# Patient Record
Sex: Male | Born: 1950 | Race: Black or African American | Hispanic: No | Smoking: Current every day smoker
Health system: Southern US, Community
[De-identification: ages and names within clinical notes are randomized; demographics above are authoritative.]

## PROBLEM LIST (undated history)

## (undated) DIAGNOSIS — I499 Cardiac arrhythmia, unspecified: Secondary | ICD-10-CM

## (undated) DIAGNOSIS — I10 Essential (primary) hypertension: Secondary | ICD-10-CM

## (undated) DIAGNOSIS — G47419 Narcolepsy without cataplexy: Secondary | ICD-10-CM

## (undated) DIAGNOSIS — H409 Unspecified glaucoma: Secondary | ICD-10-CM

## (undated) DIAGNOSIS — I519 Heart disease, unspecified: Secondary | ICD-10-CM

## (undated) DIAGNOSIS — G47411 Narcolepsy with cataplexy: Secondary | ICD-10-CM

## (undated) DIAGNOSIS — F1010 Alcohol abuse, uncomplicated: Secondary | ICD-10-CM

## (undated) HISTORY — DX: Narcolepsy with cataplexy: G47.411

## (undated) HISTORY — DX: Heart disease, unspecified: I51.9

## (undated) HISTORY — DX: Cardiac arrhythmia, unspecified: I49.9

## (undated) HISTORY — PX: HERNIA REPAIR: SHX51

---

## 2017-11-01 DIAGNOSIS — Z125 Encounter for screening for malignant neoplasm of prostate: Secondary | ICD-10-CM | POA: Diagnosis not present

## 2017-11-01 DIAGNOSIS — Z6822 Body mass index (BMI) 22.0-22.9, adult: Secondary | ICD-10-CM | POA: Diagnosis not present

## 2017-11-01 DIAGNOSIS — K450 Other specified abdominal hernia with obstruction, without gangrene: Secondary | ICD-10-CM | POA: Diagnosis not present

## 2017-11-01 DIAGNOSIS — I10 Essential (primary) hypertension: Secondary | ICD-10-CM | POA: Diagnosis not present

## 2017-11-25 DIAGNOSIS — K429 Umbilical hernia without obstruction or gangrene: Secondary | ICD-10-CM | POA: Diagnosis not present

## 2017-11-30 DIAGNOSIS — Z79899 Other long term (current) drug therapy: Secondary | ICD-10-CM | POA: Diagnosis not present

## 2017-11-30 DIAGNOSIS — Z23 Encounter for immunization: Secondary | ICD-10-CM | POA: Diagnosis not present

## 2017-11-30 DIAGNOSIS — M1990 Unspecified osteoarthritis, unspecified site: Secondary | ICD-10-CM | POA: Diagnosis not present

## 2017-11-30 DIAGNOSIS — F1010 Alcohol abuse, uncomplicated: Secondary | ICD-10-CM | POA: Diagnosis not present

## 2017-11-30 DIAGNOSIS — F17200 Nicotine dependence, unspecified, uncomplicated: Secondary | ICD-10-CM | POA: Diagnosis not present

## 2017-11-30 DIAGNOSIS — K439 Ventral hernia without obstruction or gangrene: Secondary | ICD-10-CM | POA: Diagnosis not present

## 2017-11-30 DIAGNOSIS — I10 Essential (primary) hypertension: Secondary | ICD-10-CM | POA: Diagnosis not present

## 2017-12-01 DIAGNOSIS — K439 Ventral hernia without obstruction or gangrene: Secondary | ICD-10-CM | POA: Diagnosis not present

## 2017-12-01 DIAGNOSIS — Z79899 Other long term (current) drug therapy: Secondary | ICD-10-CM | POA: Diagnosis not present

## 2017-12-01 DIAGNOSIS — Z23 Encounter for immunization: Secondary | ICD-10-CM | POA: Diagnosis not present

## 2017-12-01 DIAGNOSIS — F1010 Alcohol abuse, uncomplicated: Secondary | ICD-10-CM | POA: Diagnosis not present

## 2017-12-01 DIAGNOSIS — E119 Type 2 diabetes mellitus without complications: Secondary | ICD-10-CM | POA: Diagnosis not present

## 2017-12-01 DIAGNOSIS — F17200 Nicotine dependence, unspecified, uncomplicated: Secondary | ICD-10-CM | POA: Diagnosis not present

## 2017-12-01 DIAGNOSIS — M1990 Unspecified osteoarthritis, unspecified site: Secondary | ICD-10-CM | POA: Diagnosis not present

## 2017-12-01 DIAGNOSIS — I10 Essential (primary) hypertension: Secondary | ICD-10-CM | POA: Diagnosis not present

## 2017-12-01 DIAGNOSIS — K429 Umbilical hernia without obstruction or gangrene: Secondary | ICD-10-CM | POA: Diagnosis not present

## 2017-12-02 DIAGNOSIS — F1010 Alcohol abuse, uncomplicated: Secondary | ICD-10-CM | POA: Diagnosis not present

## 2017-12-02 DIAGNOSIS — F17200 Nicotine dependence, unspecified, uncomplicated: Secondary | ICD-10-CM | POA: Diagnosis not present

## 2017-12-02 DIAGNOSIS — Z23 Encounter for immunization: Secondary | ICD-10-CM | POA: Diagnosis not present

## 2017-12-02 DIAGNOSIS — Z79899 Other long term (current) drug therapy: Secondary | ICD-10-CM | POA: Diagnosis not present

## 2017-12-02 DIAGNOSIS — K439 Ventral hernia without obstruction or gangrene: Secondary | ICD-10-CM | POA: Diagnosis not present

## 2017-12-02 DIAGNOSIS — M1990 Unspecified osteoarthritis, unspecified site: Secondary | ICD-10-CM | POA: Diagnosis not present

## 2017-12-02 DIAGNOSIS — I10 Essential (primary) hypertension: Secondary | ICD-10-CM | POA: Diagnosis not present

## 2017-12-31 DIAGNOSIS — F1210 Cannabis abuse, uncomplicated: Secondary | ICD-10-CM | POA: Diagnosis not present

## 2017-12-31 DIAGNOSIS — M545 Low back pain: Secondary | ICD-10-CM | POA: Diagnosis not present

## 2017-12-31 DIAGNOSIS — R531 Weakness: Secondary | ICD-10-CM | POA: Diagnosis not present

## 2017-12-31 DIAGNOSIS — Z79899 Other long term (current) drug therapy: Secondary | ICD-10-CM | POA: Diagnosis not present

## 2017-12-31 DIAGNOSIS — R4182 Altered mental status, unspecified: Secondary | ICD-10-CM | POA: Diagnosis not present

## 2017-12-31 DIAGNOSIS — I10 Essential (primary) hypertension: Secondary | ICD-10-CM | POA: Diagnosis not present

## 2017-12-31 DIAGNOSIS — M25512 Pain in left shoulder: Secondary | ICD-10-CM | POA: Diagnosis not present

## 2017-12-31 DIAGNOSIS — K852 Alcohol induced acute pancreatitis without necrosis or infection: Secondary | ICD-10-CM | POA: Diagnosis not present

## 2017-12-31 DIAGNOSIS — M19012 Primary osteoarthritis, left shoulder: Secondary | ICD-10-CM | POA: Diagnosis not present

## 2017-12-31 DIAGNOSIS — R42 Dizziness and giddiness: Secondary | ICD-10-CM | POA: Diagnosis not present

## 2017-12-31 DIAGNOSIS — F10231 Alcohol dependence with withdrawal delirium: Secondary | ICD-10-CM | POA: Diagnosis not present

## 2017-12-31 DIAGNOSIS — F1410 Cocaine abuse, uncomplicated: Secondary | ICD-10-CM | POA: Diagnosis not present

## 2017-12-31 DIAGNOSIS — M4696 Unspecified inflammatory spondylopathy, lumbar region: Secondary | ICD-10-CM | POA: Diagnosis not present

## 2018-01-25 DIAGNOSIS — Z6820 Body mass index (BMI) 20.0-20.9, adult: Secondary | ICD-10-CM | POA: Diagnosis not present

## 2018-01-25 DIAGNOSIS — Z0000 Encounter for general adult medical examination without abnormal findings: Secondary | ICD-10-CM | POA: Diagnosis not present

## 2018-01-25 DIAGNOSIS — K8521 Alcohol induced acute pancreatitis with uninfected necrosis: Secondary | ICD-10-CM | POA: Diagnosis not present

## 2018-01-25 DIAGNOSIS — I10 Essential (primary) hypertension: Secondary | ICD-10-CM | POA: Diagnosis not present

## 2018-02-10 DIAGNOSIS — H401133 Primary open-angle glaucoma, bilateral, severe stage: Secondary | ICD-10-CM | POA: Diagnosis not present

## 2018-02-17 DIAGNOSIS — Z13820 Encounter for screening for osteoporosis: Secondary | ICD-10-CM | POA: Diagnosis not present

## 2018-02-17 DIAGNOSIS — M810 Age-related osteoporosis without current pathological fracture: Secondary | ICD-10-CM | POA: Diagnosis not present

## 2018-03-15 ENCOUNTER — Other Ambulatory Visit: Payer: Self-pay

## 2018-03-15 ENCOUNTER — Emergency Department (HOSPITAL_COMMUNITY)
Admission: EM | Admit: 2018-03-15 | Discharge: 2018-03-15 | Disposition: A | Discharge status: Home / Self Care | Payer: Medicare HMO | Attending: Emergency Medicine | Admitting: Emergency Medicine

## 2018-03-15 ENCOUNTER — Encounter (HOSPITAL_COMMUNITY): Payer: Self-pay | Admitting: Emergency Medicine

## 2018-03-15 DIAGNOSIS — I10 Essential (primary) hypertension: Secondary | ICD-10-CM | POA: Diagnosis not present

## 2018-03-15 DIAGNOSIS — F17210 Nicotine dependence, cigarettes, uncomplicated: Secondary | ICD-10-CM | POA: Diagnosis not present

## 2018-03-15 DIAGNOSIS — F1010 Alcohol abuse, uncomplicated: Secondary | ICD-10-CM | POA: Diagnosis not present

## 2018-03-15 DIAGNOSIS — Z008 Encounter for other general examination: Secondary | ICD-10-CM | POA: Diagnosis not present

## 2018-03-15 DIAGNOSIS — H401133 Primary open-angle glaucoma, bilateral, severe stage: Secondary | ICD-10-CM | POA: Diagnosis not present

## 2018-03-15 HISTORY — DX: Alcohol abuse, uncomplicated: F10.10

## 2018-03-15 HISTORY — DX: Unspecified glaucoma: H40.9

## 2018-03-15 HISTORY — DX: Essential (primary) hypertension: I10

## 2018-03-15 HISTORY — DX: Narcolepsy without cataplexy: G47.419

## 2018-03-15 LAB — CBC
HCT: 41.8 % (ref 39.0–52.0)
HEMOGLOBIN: 14.1 g/dL (ref 13.0–17.0)
MCH: 32.4 pg (ref 26.0–34.0)
MCHC: 33.7 g/dL (ref 30.0–36.0)
MCV: 96.1 fL (ref 78.0–100.0)
Platelets: 216 10*3/uL (ref 150–400)
RBC: 4.35 MIL/uL (ref 4.22–5.81)
RDW: 14.2 % (ref 11.5–15.5)
WBC: 5.2 10*3/uL (ref 4.0–10.5)

## 2018-03-15 LAB — RAPID URINE DRUG SCREEN, HOSP PERFORMED
AMPHETAMINES: NOT DETECTED
Benzodiazepines: NOT DETECTED
Cocaine: NOT DETECTED
OPIATES: NOT DETECTED
TETRAHYDROCANNABINOL: POSITIVE — AB

## 2018-03-15 LAB — COMPREHENSIVE METABOLIC PANEL
ALK PHOS: 79 U/L (ref 38–126)
ALT: 27 U/L (ref 0–44)
AST: 29 U/L (ref 15–41)
Albumin: 3.9 g/dL (ref 3.5–5.0)
Anion gap: 8 (ref 5–15)
BILIRUBIN TOTAL: 0.8 mg/dL (ref 0.3–1.2)
BUN: 10 mg/dL (ref 8–23)
CHLORIDE: 103 mmol/L (ref 98–111)
CO2: 29 mmol/L (ref 22–32)
CREATININE: 1.06 mg/dL (ref 0.61–1.24)
Calcium: 9.4 mg/dL (ref 8.9–10.3)
Glucose, Bld: 106 mg/dL — ABNORMAL HIGH (ref 70–99)
Potassium: 4 mmol/L (ref 3.5–5.1)
Sodium: 140 mmol/L (ref 135–145)
TOTAL PROTEIN: 6.6 g/dL (ref 6.5–8.1)

## 2018-03-15 LAB — ACETAMINOPHEN LEVEL: Acetaminophen (Tylenol), Serum: <10 ug/mL — ABNORMAL LOW (ref 10–30)

## 2018-03-15 LAB — SALICYLATE LEVEL

## 2018-03-15 LAB — ETHANOL

## 2018-03-15 MED ORDER — CHLORDIAZEPOXIDE HCL 25 MG PO CAPS: 50.0000 mg | ORAL_CAPSULE | Freq: Three times a day (TID) | ORAL | 0 refills | Status: DC | PRN

## 2018-03-15 MED ORDER — LORAZEPAM 1 MG PO TABS
2.0000 mg | ORAL_TABLET | Freq: Once | ORAL | Status: AC
  Administered 2018-03-15: 2 mg via ORAL
  Filled 2018-03-15: qty 2

## 2018-03-15 NOTE — ED Provider Notes (Signed)
Patient placed in Quick Look pathway, seen and evaluated   Chief Complaint: medical clearance  HPI:   Pt abuse alcohol, trying to quit.  [place] recommend medical screening  ROS: no SI/HI (one)  Physical Exam:   Gen: No distress  Neuro: Awake and Alert  Skin: Warm    Focused Exam: poor historian, no abd tenderness, no tremors   Initiation of care has begun. The patient has been counseled on the process, plan, and necessity for staying for the completion/evaluation, and the remainder of the medical screening examination    Marc Jimenez, Marc Jimenez 03/15/18 1636    Little, Wenda Overland, MD 03/15/18 2109

## 2018-03-15 NOTE — ED Provider Notes (Signed)
[facility] EMERGENCY DEPARTMENT Provider Note   CSN: [id number] Arrival date & time: 03/15/18  1626     History   Chief Complaint Chief Complaint  Patient presents with  . Medical Clearance    HPI Marc Jimenez is a 67 y.o. male.  67 year old male with past medical history including alcohol abuse, hypertension, glaucoma, narcolepsy who presents with alcohol abuse.  Family brought him in today for medical screening and admission for alcohol detox.  He drinks heavily daily, last drink was yesterday evening.  No hallucinations, vomiting, SI, or HI.  They spoke with somebody at an outpatient facility who recommended that they come here to be admitted for detox.  Patient currently denies any complaints.  The history is provided by the patient and a relative.    Past Medical History:  Diagnosis Date  . Alcohol abuse   . Glaucoma   . Hypertension   . Narcolepsy     There are no active problems to display for this patient.   Past Surgical History:  Procedure Laterality Date  . HERNIA REPAIR          Home Medications    Prior to Admission medications   Medication Sig Start Date End Date Taking? Authorizing Provider  chlordiazePOXIDE (LIBRIUM) 25 MG capsule Take 2 capsules (50 mg total) by mouth 3 (three) times daily as needed for withdrawal. 50mg  by mouth q 6 h day 1 50mg  by mouth q 8 h day 2 50mg  by mouth q 12 h day 3 50mg  by mouth QHS day 4 03/15/18   Raffaela Ladley, Wenda Overland, MD    Family History History reviewed. No pertinent family history.  Social History Social History   Tobacco Use  . Smoking status: Current Every Day Smoker    Packs/day: 1.00  . Smokeless tobacco: Never Used  Substance Use Topics  . Alcohol use: Yes    Alcohol/week: 12.6 oz    Types: 21 Cans of beer per week    Comment: 1 pint of liquor every day  . Drug use: Yes    Types: Marijuana     Allergies   Patient has no known allergies.   Review of  Systems Review of Systems All other systems reviewed and are negative except that which was mentioned in HPI   Physical Exam Updated Vital Signs BP (!) 178/95   Pulse (!) 54   Temp 97.7 F (36.5 C) (Oral)   Resp 16   Ht 5\' 6"  (1.676 m)   Wt 64.4 kg (142 lb)   SpO2 97%   BMI 22.92 kg/m   Physical Exam  Constitutional: He is oriented to person, place, and time. He appears well-developed. No distress.  Thin, chronically ill-appearing, comfortable  HENT:  Head: Normocephalic and atraumatic.  Moist mucous membranes  Eyes: Pupils are equal, round, and reactive to light.  B/l conjunctival injection  Neck: Neck supple.  Cardiovascular: Regular rhythm and normal heart sounds. Bradycardia present.  No murmur heard. Pulmonary/Chest: Effort normal and breath sounds normal.  Abdominal: Soft. Bowel sounds are normal. He exhibits no distension. There is no tenderness.  Musculoskeletal: He exhibits no edema.  Neurological: He is alert and oriented to person, place, and time.  Fluent speech Following commands, mild bilateral hand tremor  Skin: Skin is warm and dry.  Psychiatric: He has a normal mood and affect.  Nursing note and vitals reviewed.    ED Treatments / Results  Labs (all labs ordered are listed, but only abnormal  results are displayed) Labs Reviewed  COMPREHENSIVE METABOLIC PANEL - Abnormal; Notable for the following components:      Result Value   Glucose, Bld 106 (*)    All other components within normal limits  ACETAMINOPHEN LEVEL - Abnormal; Notable for the following components:   Acetaminophen (Tylenol), Serum <10 (*)    All other components within normal limits  ETHANOL  CBC  SALICYLATE LEVEL  RAPID URINE DRUG SCREEN, HOSP PERFORMED    EKG None  Radiology No results found.  Procedures Procedures (including critical care time)  Medications Ordered in ED Medications  LORazepam (ATIVAN) tablet 2 mg (2 mg Oral Given 03/15/18 2046)     Initial  Impression / Assessment and Plan / ED Course  I have reviewed the triage vital signs and the nursing notes.  Pertinent labs & imaging results that were available during my care of the patient were reviewed by me and considered in my medical decision making (see chart for details).    Patient was comfortable on exam, hypertensive, mild hand tremor but no agitation, no confusion, no evidence of delirium tremens.  His screening lab work is unremarkable.  Gave 1 dose of oral Ativan for tremors.  I spoke with the patient's sister over the phone as well as nephew who was with patient in room.  Splane that unfortunately we are not able to admit the patient for alcohol detox without any severe metabolic or vital sign derangements or any signs/sx of DTs.  Provided with Librium taper and outpatient resources and recommended contacting various outpatient facilities to find a facility where he can be admitted for acute detox.  Extensively reviewed return precautions with the patient and his nephew.  They voiced understanding. Final Clinical Impressions(s) / ED Diagnoses   Final diagnoses:  Alcohol abuse    ED Discharge Orders        Ordered    chlordiazePOXIDE (LIBRIUM) 25 MG capsule  3 times daily PRN     03/15/18 2106       Kourtney Terriquez, Wenda Overland, MD 03/15/18 2114

## 2018-03-15 NOTE — ED Notes (Signed)
Discharge instructions, medications, and community resources reviewed with patient. Pt denied any further requests. Nephew to take home pt. Pt verbalized understanding of discharge instructions.

## 2018-03-15 NOTE — ED Notes (Signed)
ED Provider at bedside. 

## 2018-03-15 NOTE — ED Triage Notes (Signed)
Pt reports he was trying to stop drinking alcohol but can't. Pt last drank something yesterday. Pt's family brought him here for medical clearance to have him admitted to a facility for detox

## 2018-05-05 DIAGNOSIS — Z6820 Body mass index (BMI) 20.0-20.9, adult: Secondary | ICD-10-CM | POA: Diagnosis not present

## 2018-05-05 DIAGNOSIS — Z0000 Encounter for general adult medical examination without abnormal findings: Secondary | ICD-10-CM | POA: Diagnosis not present

## 2018-05-17 DIAGNOSIS — Z800 Family history of malignant neoplasm of digestive organs: Secondary | ICD-10-CM | POA: Diagnosis not present

## 2018-05-25 DIAGNOSIS — Z800 Family history of malignant neoplasm of digestive organs: Secondary | ICD-10-CM | POA: Diagnosis not present

## 2018-05-25 DIAGNOSIS — I10 Essential (primary) hypertension: Secondary | ICD-10-CM | POA: Diagnosis not present

## 2018-05-25 DIAGNOSIS — F17200 Nicotine dependence, unspecified, uncomplicated: Secondary | ICD-10-CM | POA: Diagnosis not present

## 2018-05-25 DIAGNOSIS — Z79899 Other long term (current) drug therapy: Secondary | ICD-10-CM | POA: Diagnosis not present

## 2018-05-25 DIAGNOSIS — Z1211 Encounter for screening for malignant neoplasm of colon: Secondary | ICD-10-CM | POA: Diagnosis not present

## 2018-06-20 DIAGNOSIS — M542 Cervicalgia: Secondary | ICD-10-CM | POA: Diagnosis not present

## 2018-06-20 DIAGNOSIS — I10 Essential (primary) hypertension: Secondary | ICD-10-CM | POA: Diagnosis not present

## 2018-06-20 DIAGNOSIS — Z79899 Other long term (current) drug therapy: Secondary | ICD-10-CM | POA: Diagnosis not present

## 2018-06-20 DIAGNOSIS — M5010 Cervical disc disorder with radiculopathy, unspecified cervical region: Secondary | ICD-10-CM | POA: Diagnosis not present

## 2018-06-20 DIAGNOSIS — F17200 Nicotine dependence, unspecified, uncomplicated: Secondary | ICD-10-CM | POA: Diagnosis not present

## 2018-06-22 DIAGNOSIS — H401113 Primary open-angle glaucoma, right eye, severe stage: Secondary | ICD-10-CM | POA: Diagnosis not present

## 2018-06-22 DIAGNOSIS — Z01818 Encounter for other preprocedural examination: Secondary | ICD-10-CM | POA: Diagnosis not present

## 2018-06-22 DIAGNOSIS — H409 Unspecified glaucoma: Secondary | ICD-10-CM | POA: Diagnosis not present

## 2018-08-12 DIAGNOSIS — S134XXD Sprain of ligaments of cervical spine, subsequent encounter: Secondary | ICD-10-CM | POA: Diagnosis not present

## 2018-08-12 DIAGNOSIS — Z6822 Body mass index (BMI) 22.0-22.9, adult: Secondary | ICD-10-CM | POA: Diagnosis not present

## 2018-08-12 DIAGNOSIS — I10 Essential (primary) hypertension: Secondary | ICD-10-CM | POA: Diagnosis not present

## 2018-09-01 DIAGNOSIS — I10 Essential (primary) hypertension: Secondary | ICD-10-CM | POA: Diagnosis not present

## 2018-09-01 DIAGNOSIS — F17200 Nicotine dependence, unspecified, uncomplicated: Secondary | ICD-10-CM | POA: Diagnosis not present

## 2018-09-01 DIAGNOSIS — R339 Retention of urine, unspecified: Secondary | ICD-10-CM | POA: Diagnosis not present

## 2018-09-05 DIAGNOSIS — Z6821 Body mass index (BMI) 21.0-21.9, adult: Secondary | ICD-10-CM | POA: Diagnosis not present

## 2018-09-05 DIAGNOSIS — N399 Disorder of urinary system, unspecified: Secondary | ICD-10-CM | POA: Diagnosis not present

## 2018-09-05 DIAGNOSIS — F10229 Alcohol dependence with intoxication, unspecified: Secondary | ICD-10-CM | POA: Diagnosis not present

## 2018-09-05 DIAGNOSIS — I10 Essential (primary) hypertension: Secondary | ICD-10-CM | POA: Diagnosis not present

## 2018-09-14 DIAGNOSIS — R338 Other retention of urine: Secondary | ICD-10-CM | POA: Diagnosis not present

## 2018-09-14 DIAGNOSIS — N401 Enlarged prostate with lower urinary tract symptoms: Secondary | ICD-10-CM | POA: Diagnosis not present

## 2018-09-27 DIAGNOSIS — N401 Enlarged prostate with lower urinary tract symptoms: Secondary | ICD-10-CM | POA: Diagnosis not present

## 2018-09-27 DIAGNOSIS — R338 Other retention of urine: Secondary | ICD-10-CM | POA: Diagnosis not present

## 2018-12-19 DIAGNOSIS — Z6820 Body mass index (BMI) 20.0-20.9, adult: Secondary | ICD-10-CM | POA: Diagnosis not present

## 2018-12-19 DIAGNOSIS — R69 Illness, unspecified: Secondary | ICD-10-CM | POA: Diagnosis not present

## 2018-12-19 DIAGNOSIS — I10 Essential (primary) hypertension: Secondary | ICD-10-CM | POA: Diagnosis not present

## 2018-12-19 DIAGNOSIS — M545 Low back pain: Secondary | ICD-10-CM | POA: Diagnosis not present

## 2019-08-09 DIAGNOSIS — I10 Essential (primary) hypertension: Secondary | ICD-10-CM | POA: Diagnosis not present

## 2019-08-09 DIAGNOSIS — R5383 Other fatigue: Secondary | ICD-10-CM | POA: Diagnosis not present

## 2019-08-09 DIAGNOSIS — Z125 Encounter for screening for malignant neoplasm of prostate: Secondary | ICD-10-CM | POA: Diagnosis not present

## 2019-08-09 DIAGNOSIS — N390 Urinary tract infection, site not specified: Secondary | ICD-10-CM | POA: Diagnosis not present

## 2019-08-09 DIAGNOSIS — Z03818 Encounter for observation for suspected exposure to other biological agents ruled out: Secondary | ICD-10-CM | POA: Diagnosis not present

## 2019-08-09 DIAGNOSIS — R69 Illness, unspecified: Secondary | ICD-10-CM | POA: Diagnosis not present

## 2019-08-09 DIAGNOSIS — R350 Frequency of micturition: Secondary | ICD-10-CM | POA: Diagnosis not present

## 2019-08-09 DIAGNOSIS — N401 Enlarged prostate with lower urinary tract symptoms: Secondary | ICD-10-CM | POA: Diagnosis not present

## 2019-09-24 ENCOUNTER — Encounter (HOSPITAL_COMMUNITY): Payer: Self-pay | Admitting: *Deleted

## 2019-09-24 ENCOUNTER — Other Ambulatory Visit: Payer: Self-pay

## 2019-09-24 ENCOUNTER — Emergency Department (HOSPITAL_COMMUNITY)
Admission: EM | Admit: 2019-09-24 | Discharge: 2019-09-24 | Disposition: A | Discharge status: Home / Self Care | Payer: Medicare HMO | Attending: Emergency Medicine | Admitting: Emergency Medicine

## 2019-09-24 DIAGNOSIS — I10 Essential (primary) hypertension: Secondary | ICD-10-CM | POA: Insufficient documentation

## 2019-09-24 DIAGNOSIS — M549 Dorsalgia, unspecified: Secondary | ICD-10-CM | POA: Diagnosis present

## 2019-09-24 DIAGNOSIS — Z03818 Encounter for observation for suspected exposure to other biological agents ruled out: Secondary | ICD-10-CM | POA: Diagnosis not present

## 2019-09-24 DIAGNOSIS — Z20822 Contact with and (suspected) exposure to covid-19: Secondary | ICD-10-CM | POA: Insufficient documentation

## 2019-09-24 DIAGNOSIS — G8929 Other chronic pain: Secondary | ICD-10-CM | POA: Diagnosis not present

## 2019-09-24 DIAGNOSIS — R69 Illness, unspecified: Secondary | ICD-10-CM | POA: Diagnosis not present

## 2019-09-24 DIAGNOSIS — F1210 Cannabis abuse, uncomplicated: Secondary | ICD-10-CM | POA: Diagnosis not present

## 2019-09-24 DIAGNOSIS — F17210 Nicotine dependence, cigarettes, uncomplicated: Secondary | ICD-10-CM | POA: Diagnosis not present

## 2019-09-24 DIAGNOSIS — M545 Low back pain, unspecified: Secondary | ICD-10-CM

## 2019-09-24 NOTE — ED Triage Notes (Signed)
Pt c/o right side flank pain that radiates to right buttock region, report that he has been exposed to covid,

## 2019-09-24 NOTE — ED Provider Notes (Signed)
[facility] EMERGENCY DEPARTMENT Provider Note   CSN: [id number] Arrival date & time: 09/24/19  1258     History Chief Complaint  Patient presents with  . Back Pain    Marc Jimenez is a 69 y.o. male who presents for evaluation of back pain and concern for exposure to Covid.  Patient states that he always has back pain and states that this is similar to his previous back pain.  He states his symptoms of back pain goes into his right leg.  He states that this is been an ongoing issue for years.  He denies any new trauma, injury, fall.  He states that somebody in his house recently tested positive for Covid and given that he was having more aches, they were concerned about possible Covid and wanted him to get tested.  He states he has some rhinorrhea but states that this is chronic.  He denies any fevers, cough, chest pain, difficulty breathing, abdominal pain, numbness/weakness of his arms or legs, saddle anesthesia, urinary or bowel incontinence, history of back surgeries.  The history is provided by the patient.       Past Medical History:  Diagnosis Date  . Alcohol abuse   . Glaucoma   . Hypertension   . Narcolepsy     There are no problems to display for this patient.   Past Surgical History:  Procedure Laterality Date  . HERNIA REPAIR         No family history on file.  Social History   Tobacco Use  . Smoking status: Current Every Day Smoker    Packs/day: 1.00  . Smokeless tobacco: Never Used  Substance Use Topics  . Alcohol use: Yes    Alcohol/week: 21.0 standard drinks    Types: 21 Cans of beer per week    Comment: 1 pint of liquor every day  . Drug use: Yes    Types: Marijuana    Home Medications Prior to Admission medications   Medication Sig Start Date End Date Taking? Authorizing Provider  chlordiazePOXIDE (LIBRIUM) 25 MG capsule Take 2 capsules (50 mg total) by mouth 3 (three) times daily as needed for withdrawal. 50mg  by mouth q 6 h day  1 50mg  by mouth q 8 h day 2 50mg  by mouth q 12 h day 3 50mg  by mouth QHS day 4 03/15/18   Little, Wenda Overland, MD    Allergies    Patient has no known allergies.  Review of Systems   Review of Systems  Constitutional: Negative for fever.  HENT: Positive for rhinorrhea.   Respiratory: Negative for cough and shortness of breath.   Cardiovascular: Negative for chest pain.  Gastrointestinal: Negative for abdominal pain, nausea and vomiting.  Genitourinary: Negative for dysuria and hematuria.  Musculoskeletal: Positive for back pain.  Neurological: Positive for numbness. Negative for weakness.  All other systems reviewed and are negative.   Physical Exam Updated Vital Signs BP (!) 167/91   Pulse 82   Temp 97.7 F (36.5 C) (Oral)   Resp 16   Ht 5\' 7"  (1.702 m)   Wt 64.4 kg   SpO2 99%   BMI 22.24 kg/m   Physical Exam Vitals and nursing note reviewed.  Constitutional:      Appearance: Normal appearance. He is well-developed.  HENT:     Head: Normocephalic and atraumatic.  Eyes:     General: Lids are normal.     Conjunctiva/sclera: Conjunctivae normal.     Pupils: Pupils are equal,  round, and reactive to light.  Cardiovascular:     Rate and Rhythm: Normal rate and regular rhythm.     Pulses: Normal pulses.     Heart sounds: Normal heart sounds. No murmur. No friction rub. No gallop.   Pulmonary:     Effort: Pulmonary effort is normal.     Breath sounds: Normal breath sounds.  Abdominal:     Palpations: Abdomen is soft. Abdomen is not rigid.     Tenderness: There is no abdominal tenderness. There is no guarding.  Musculoskeletal:        General: Normal range of motion.     Cervical back: Full passive range of motion without pain.     Comments: No midline T-spine tenderness.  Diffuse lumbar tenderness of the entire lumbar region.  Skin:    General: Skin is warm and dry.     Capillary Refill: Capillary refill takes less than 2 seconds.  Neurological:     Mental  Status: He is alert and oriented to person, place, and time.     Comments: Follows commands, Moves all extremities  5/5 strength to BUE and BLE  Sensation intact throughout all major nerve distributions  Psychiatric:        Speech: Speech normal.     ED Results / Procedures / Treatments   Labs (all labs ordered are listed, but only abnormal results are displayed) Labs Reviewed  SARS CORONAVIRUS 2 (TAT 6-24 HRS)    EKG None  Radiology No results found.  Procedures Procedures (including critical care time)  Medications Ordered in ED Medications - No data to display  ED Course  I have reviewed the triage vital signs and the nursing notes.  Pertinent labs & imaging results that were available during my care of the patient were reviewed by me and considered in my medical decision making (see chart for details).    MDM Rules/Calculators/A&P                      69 year old male who presents for evaluation of Covid exposure.  He has chronic back pain and was still having back pain over the last week and was concerned that this might be Covid related.  He has had some rhinorrhea which is also chronic.  No chest pain, difficulty breathing, abdominal pain, numbness/weakness. Patient is afebrile, non-toxic appearing, sitting comfortably on examination table. Vital signs reviewed and stable.  Will plan for outpatient Covid test.  History/physical exam not concerning for traumatic injury given lack of trauma, GU etiology, spinal abscess, cauda equina.  At this time, patient exhibits no emergent life-threatening condition that require further evaluation in ED or admission. Patient had ample opportunity for questions and discussion. All patient's questions were answered with full understanding.   Portions of this note were generated with [employer]. Dictation errors may occur despite best attempts at proofreading.    Final Clinical Impression(s) / ED Diagnoses Final  diagnoses:  Chronic low back pain, unspecified back pain laterality, unspecified whether sciatica present    Rx / DC Orders ED Discharge Orders    None       Desma Mcgregor 09/24/19 1425    Milton Ferguson, MD 09/25/19 1454

## 2019-09-24 NOTE — Discharge Instructions (Signed)
You have a Covid test pending.  If the results come back positive, you will need to quarantine for 2 weeks.  He should quarantine until results come back.  Return the emergency department for any difficulty breathing, chest pain, vomiting, numbness/weakness of your arms or legs, inability to walk or any other worsening or concerning symptoms.

## 2019-09-25 LAB — SARS CORONAVIRUS 2 (TAT 6-24 HRS): SARS Coronavirus 2: NEGATIVE

## 2019-11-13 DIAGNOSIS — R69 Illness, unspecified: Secondary | ICD-10-CM | POA: Diagnosis not present

## 2019-11-13 DIAGNOSIS — M1990 Unspecified osteoarthritis, unspecified site: Secondary | ICD-10-CM | POA: Diagnosis not present

## 2019-11-13 DIAGNOSIS — Z79899 Other long term (current) drug therapy: Secondary | ICD-10-CM | POA: Diagnosis not present

## 2019-11-13 DIAGNOSIS — T699XXA Effect of reduced temperature, unspecified, initial encounter: Secondary | ICD-10-CM | POA: Diagnosis not present

## 2019-11-13 DIAGNOSIS — M79671 Pain in right foot: Secondary | ICD-10-CM | POA: Diagnosis not present

## 2019-11-13 DIAGNOSIS — M79672 Pain in left foot: Secondary | ICD-10-CM | POA: Diagnosis not present

## 2019-11-13 DIAGNOSIS — F17200 Nicotine dependence, unspecified, uncomplicated: Secondary | ICD-10-CM | POA: Diagnosis not present

## 2019-11-13 DIAGNOSIS — I10 Essential (primary) hypertension: Secondary | ICD-10-CM | POA: Diagnosis not present

## 2019-11-13 DIAGNOSIS — Z590 Homelessness: Secondary | ICD-10-CM | POA: Diagnosis not present

## 2020-06-13 DIAGNOSIS — M545 Low back pain, unspecified: Secondary | ICD-10-CM | POA: Diagnosis not present

## 2020-06-13 DIAGNOSIS — Z6821 Body mass index (BMI) 21.0-21.9, adult: Secondary | ICD-10-CM | POA: Diagnosis not present

## 2020-06-13 DIAGNOSIS — I10 Essential (primary) hypertension: Secondary | ICD-10-CM | POA: Diagnosis not present

## 2020-06-13 DIAGNOSIS — R69 Illness, unspecified: Secondary | ICD-10-CM | POA: Diagnosis not present

## 2020-06-19 DIAGNOSIS — H401133 Primary open-angle glaucoma, bilateral, severe stage: Secondary | ICD-10-CM | POA: Diagnosis not present

## 2020-06-19 DIAGNOSIS — H25813 Combined forms of age-related cataract, bilateral: Secondary | ICD-10-CM | POA: Diagnosis not present

## 2020-10-10 DIAGNOSIS — Z0000 Encounter for general adult medical examination without abnormal findings: Secondary | ICD-10-CM | POA: Diagnosis not present

## 2020-10-10 DIAGNOSIS — M545 Low back pain, unspecified: Secondary | ICD-10-CM | POA: Diagnosis not present

## 2020-10-10 DIAGNOSIS — R69 Illness, unspecified: Secondary | ICD-10-CM | POA: Diagnosis not present

## 2020-10-10 DIAGNOSIS — I10 Essential (primary) hypertension: Secondary | ICD-10-CM | POA: Diagnosis not present

## 2020-10-10 DIAGNOSIS — Z6822 Body mass index (BMI) 22.0-22.9, adult: Secondary | ICD-10-CM | POA: Diagnosis not present

## 2020-10-10 DIAGNOSIS — Z1331 Encounter for screening for depression: Secondary | ICD-10-CM | POA: Diagnosis not present

## 2020-10-11 DIAGNOSIS — R7303 Prediabetes: Secondary | ICD-10-CM | POA: Diagnosis not present

## 2020-10-11 DIAGNOSIS — M545 Low back pain, unspecified: Secondary | ICD-10-CM | POA: Diagnosis not present

## 2020-10-11 DIAGNOSIS — I10 Essential (primary) hypertension: Secondary | ICD-10-CM | POA: Diagnosis not present

## 2020-10-11 DIAGNOSIS — R69 Illness, unspecified: Secondary | ICD-10-CM | POA: Diagnosis not present

## 2020-10-11 DIAGNOSIS — Z0000 Encounter for general adult medical examination without abnormal findings: Secondary | ICD-10-CM | POA: Diagnosis not present

## 2020-10-11 DIAGNOSIS — Z125 Encounter for screening for malignant neoplasm of prostate: Secondary | ICD-10-CM | POA: Diagnosis not present

## 2020-11-06 DIAGNOSIS — I2129 ST elevation (STEMI) myocardial infarction involving other sites: Secondary | ICD-10-CM | POA: Diagnosis not present

## 2020-11-06 DIAGNOSIS — R1084 Generalized abdominal pain: Secondary | ICD-10-CM | POA: Diagnosis not present

## 2020-11-06 DIAGNOSIS — J439 Emphysema, unspecified: Secondary | ICD-10-CM | POA: Diagnosis not present

## 2020-11-06 DIAGNOSIS — K5900 Constipation, unspecified: Secondary | ICD-10-CM | POA: Diagnosis not present

## 2020-11-06 DIAGNOSIS — K6389 Other specified diseases of intestine: Secondary | ICD-10-CM | POA: Diagnosis not present

## 2020-11-06 DIAGNOSIS — N281 Cyst of kidney, acquired: Secondary | ICD-10-CM | POA: Diagnosis not present

## 2020-11-06 DIAGNOSIS — Z20822 Contact with and (suspected) exposure to covid-19: Secondary | ICD-10-CM | POA: Diagnosis not present

## 2020-11-06 DIAGNOSIS — E876 Hypokalemia: Secondary | ICD-10-CM | POA: Diagnosis not present

## 2020-11-06 DIAGNOSIS — I498 Other specified cardiac arrhythmias: Secondary | ICD-10-CM | POA: Diagnosis not present

## 2020-11-06 DIAGNOSIS — I4891 Unspecified atrial fibrillation: Secondary | ICD-10-CM | POA: Diagnosis not present

## 2020-11-06 DIAGNOSIS — I4892 Unspecified atrial flutter: Secondary | ICD-10-CM | POA: Diagnosis not present

## 2020-11-06 DIAGNOSIS — I10 Essential (primary) hypertension: Secondary | ICD-10-CM | POA: Diagnosis not present

## 2020-11-06 DIAGNOSIS — R52 Pain, unspecified: Secondary | ICD-10-CM | POA: Diagnosis not present

## 2020-11-06 DIAGNOSIS — I447 Left bundle-branch block, unspecified: Secondary | ICD-10-CM | POA: Diagnosis not present

## 2020-11-06 DIAGNOSIS — Q438 Other specified congenital malformations of intestine: Secondary | ICD-10-CM | POA: Diagnosis not present

## 2020-11-06 DIAGNOSIS — R69 Illness, unspecified: Secondary | ICD-10-CM | POA: Diagnosis not present

## 2020-11-06 DIAGNOSIS — K562 Volvulus: Secondary | ICD-10-CM | POA: Diagnosis not present

## 2020-11-06 DIAGNOSIS — R001 Bradycardia, unspecified: Secondary | ICD-10-CM | POA: Diagnosis not present

## 2020-11-06 DIAGNOSIS — K3189 Other diseases of stomach and duodenum: Secondary | ICD-10-CM | POA: Diagnosis not present

## 2020-11-06 DIAGNOSIS — Z720 Tobacco use: Secondary | ICD-10-CM | POA: Diagnosis not present

## 2020-11-06 DIAGNOSIS — E46 Unspecified protein-calorie malnutrition: Secondary | ICD-10-CM | POA: Diagnosis not present

## 2020-11-06 DIAGNOSIS — R5381 Other malaise: Secondary | ICD-10-CM | POA: Diagnosis not present

## 2020-11-06 DIAGNOSIS — I442 Atrioventricular block, complete: Secondary | ICD-10-CM | POA: Diagnosis not present

## 2020-11-06 DIAGNOSIS — Z9889 Other specified postprocedural states: Secondary | ICD-10-CM | POA: Diagnosis not present

## 2020-11-06 DIAGNOSIS — R109 Unspecified abdominal pain: Secondary | ICD-10-CM | POA: Diagnosis not present

## 2020-11-08 DIAGNOSIS — I4891 Unspecified atrial fibrillation: Secondary | ICD-10-CM | POA: Diagnosis not present

## 2020-11-08 DIAGNOSIS — K562 Volvulus: Secondary | ICD-10-CM | POA: Diagnosis not present

## 2020-11-08 DIAGNOSIS — Z79899 Other long term (current) drug therapy: Secondary | ICD-10-CM | POA: Diagnosis not present

## 2020-11-08 DIAGNOSIS — I4892 Unspecified atrial flutter: Secondary | ICD-10-CM | POA: Diagnosis not present

## 2020-11-08 DIAGNOSIS — Z9889 Other specified postprocedural states: Secondary | ICD-10-CM | POA: Diagnosis not present

## 2020-11-08 DIAGNOSIS — R69 Illness, unspecified: Secondary | ICD-10-CM | POA: Diagnosis not present

## 2020-11-08 DIAGNOSIS — I10 Essential (primary) hypertension: Secondary | ICD-10-CM | POA: Diagnosis not present

## 2020-11-08 DIAGNOSIS — E876 Hypokalemia: Secondary | ICD-10-CM | POA: Diagnosis not present

## 2020-11-08 DIAGNOSIS — I498 Other specified cardiac arrhythmias: Secondary | ICD-10-CM | POA: Diagnosis not present

## 2020-11-25 NOTE — Progress Notes (Deleted)
  Subjective: No diagnosis found.   Consult requested by Dr. Stoney Bang.  [name] is a 70 yo male who is sent for an elevated PSA.   He was previously seen by Dr. Gloriann Loan in [place].  He has a history of BPH with retention and had been on tamsulosin.  He has a history of alcohol abuse.  ROS:  ROS  No Known Allergies  Past Medical History:  Diagnosis Date  . Alcohol abuse   . Glaucoma   . Hypertension   . Narcolepsy     Past Surgical History:  Procedure Laterality Date  . HERNIA REPAIR      Social History   Socioeconomic History  . Marital status: Legally Separated    Spouse name: Not on file  . Number of children: Not on file  . Years of education: Not on file  . Highest education level: Not on file  Occupational History  . Not on file  Tobacco Use  . Smoking status: Current Every Day Smoker    Packs/day: 1.00  . Smokeless tobacco: Never Used  Substance and Sexual Activity  . Alcohol use: Yes    Alcohol/week: 21.0 standard drinks    Types: 21 Cans of beer per week    Comment: 1 pint of liquor every day  . Drug use: Yes    Types: Marijuana  . Sexual activity: Not on file  Other Topics Concern  . Not on file  Social History Narrative  . Not on file   Social Determinants of Health   Financial Resource Strain: Not on file  Food Insecurity: Not on file  Transportation Needs: Not on file  Physical Activity: Not on file  Stress: Not on file  Social Connections: Not on file  Intimate Partner Violence: Not on file    No family history on file.  Anti-infectives: Anti-infectives (From admission, onward)   None      Current Outpatient Medications  Medication Sig Dispense Refill  . chlordiazePOXIDE (LIBRIUM) 25 MG capsule Take 2 capsules (50 mg total) by mouth 3 (three) times daily as needed for withdrawal. 50mg  by mouth q 6 h day 1 50mg  by mouth q 8 h day 2 50mg  by mouth q 12 h day 3 50mg  by mouth QHS day 4 20 capsule 0   No current  facility-administered medications for this visit.     Objective: Vital signs in last 24 hours: There were no vitals taken for this visit.  Intake/Output from previous day: No intake/output data recorded. Intake/Output this shift: @IOTHISSHIFT @   Physical Exam  Lab Results:  No results found for this or any previous visit (from the past 24 hour(s)).  BMET No results for input(s): NA, K, CL, CO2, GLUCOSE, BUN, CREATININE, CALCIUM in the last 72 hours. PT/INR No results for input(s): LABPROT, INR in the last 72 hours. ABG No results for input(s): PHART, HCO3 in the last 72 hours.  Invalid input(s): PCO2, PO2  Studies/Results: No results found.   Assessment/Plan: No problem-specific Assessment & Plan notes found for this encounter.   No orders of the defined types were placed in this encounter.    No orders of the defined types were placed in this encounter.    No follow-ups on file.    CC: ***     Irine Seal 11/25/2020 [phone]

## 2020-11-28 ENCOUNTER — Ambulatory Visit: Payer: Medicare HMO | Admitting: Urology

## 2020-12-25 DIAGNOSIS — Z0000 Encounter for general adult medical examination without abnormal findings: Secondary | ICD-10-CM | POA: Diagnosis not present

## 2020-12-25 DIAGNOSIS — I10 Essential (primary) hypertension: Secondary | ICD-10-CM | POA: Diagnosis not present

## 2020-12-25 DIAGNOSIS — Z1159 Encounter for screening for other viral diseases: Secondary | ICD-10-CM | POA: Diagnosis not present

## 2020-12-25 DIAGNOSIS — N400 Enlarged prostate without lower urinary tract symptoms: Secondary | ICD-10-CM | POA: Diagnosis not present

## 2020-12-25 DIAGNOSIS — M545 Low back pain, unspecified: Secondary | ICD-10-CM | POA: Diagnosis not present

## 2020-12-25 DIAGNOSIS — Z6820 Body mass index (BMI) 20.0-20.9, adult: Secondary | ICD-10-CM | POA: Diagnosis not present

## 2020-12-25 DIAGNOSIS — R69 Illness, unspecified: Secondary | ICD-10-CM | POA: Diagnosis not present

## 2020-12-25 DIAGNOSIS — R0602 Shortness of breath: Secondary | ICD-10-CM | POA: Diagnosis not present

## 2020-12-25 DIAGNOSIS — N39498 Other specified urinary incontinence: Secondary | ICD-10-CM | POA: Diagnosis not present

## 2021-01-09 ENCOUNTER — Ambulatory Visit (INDEPENDENT_AMBULATORY_CARE_PROVIDER_SITE_OTHER): Payer: Medicare HMO | Admitting: Urology

## 2021-01-09 ENCOUNTER — Other Ambulatory Visit: Payer: Self-pay

## 2021-01-09 ENCOUNTER — Encounter: Payer: Self-pay | Admitting: Urology

## 2021-01-09 VITALS — BP 142/81 | HR 72 | Temp 98.2°F | Ht 67.0 in | Wt 135.0 lb

## 2021-01-09 DIAGNOSIS — N138 Other obstructive and reflux uropathy: Secondary | ICD-10-CM | POA: Diagnosis not present

## 2021-01-09 DIAGNOSIS — R972 Elevated prostate specific antigen [PSA]: Secondary | ICD-10-CM

## 2021-01-09 DIAGNOSIS — N401 Enlarged prostate with lower urinary tract symptoms: Secondary | ICD-10-CM | POA: Diagnosis not present

## 2021-01-09 DIAGNOSIS — R3915 Urgency of urination: Secondary | ICD-10-CM | POA: Diagnosis not present

## 2021-01-09 DIAGNOSIS — N390 Urinary tract infection, site not specified: Secondary | ICD-10-CM

## 2021-01-09 LAB — URINALYSIS, ROUTINE W REFLEX MICROSCOPIC
Bilirubin, UA: NEGATIVE
Glucose, UA: NEGATIVE
Ketones, UA: NEGATIVE
Nitrite, UA: NEGATIVE
Protein,UA: NEGATIVE
Specific Gravity, UA: 1.015 (ref 1.005–1.030)
Urobilinogen, Ur: 0.2 mg/dL (ref 0.2–1.0)
pH, UA: 6 (ref 5.0–7.5)

## 2021-01-09 LAB — MICROSCOPIC EXAMINATION

## 2021-01-09 NOTE — Progress Notes (Signed)
Subjective: 1. Elevated PSA   2. Urinary tract infection without hematuria, site unspecified   3. Urgency of urination   4. BPH with urinary obstruction      Consult requested by Dr. Stoney Jimenez.   [name] is a 70 yo who is sent for a PSA of 7.1 on labs from 10/11/20.  He was seen in [place] in 2019 for BPH with BOO and was given tamsulosin but failed to f/u.   He remains on tamsulosin.   He has a long history of alcoholism and was not felt to be a good candidate for PSA screening at that time.   He is currently in assisted living facility.  He had the PSA checked because of LUTS.  He has some frequency and urgency and UUI.  He has some hesitancy with an intermittent stream.   He has an adequate stream.  He has had no hematuria or dysuria.  He has had dark urine with some odor.  His UA today has Pyuria and bacteriuria.   He had a clear urine on 11/06/20 at [facility].  His Cr was 1.07.  He was seen for a sigmoid volvulus at that time.  On the CT his prostate was large but he wasn't in retention.  He didn't have a foley.   PVR today is 74ml. ROS:  ROS  No Known Allergies  Past Medical History:  Diagnosis Date  . Alcohol abuse   . Arrhythmia   . Cataplexy   . Glaucoma   . Heart disease   . Hypertension   . Narcolepsy   . Narcolepsy     Past Surgical History:  Procedure Laterality Date  . HERNIA REPAIR      Social History   Socioeconomic History  . Marital status: Legally Separated    Spouse name: Not on file  . Number of children: Not on file  . Years of education: Not on file  . Highest education level: Not on file  Occupational History  . Not on file  Tobacco Use  . Smoking status: Current Every Day Smoker    Packs/day: 1.00    Years: 30.00    Pack years: 30.00  . Smokeless tobacco: Never Used  Substance and Sexual Activity  . Alcohol use: Yes    Alcohol/week: 21.0 standard drinks    Types: 21 Cans of beer per week    Comment: 1 pint of liquor every day  . Drug  use: Yes    Types: Marijuana  . Sexual activity: Not on file  Other Topics Concern  . Not on file  Social History Narrative  . Not on file   Social Determinants of Health   Financial Resource Strain: Not on file  Food Insecurity: Not on file  Transportation Needs: Not on file  Physical Activity: Not on file  Stress: Not on file  Social Connections: Not on file  Intimate Partner Violence: Not on file    History reviewed. No pertinent family history.  Anti-infectives: Anti-infectives (From admission, onward)   None      Current Outpatient Medications  Medication Sig Dispense Refill  . amLODipine (NORVASC) 5 MG tablet Take 1 tablet by mouth daily.    . chlordiazePOXIDE (LIBRIUM) 25 MG capsule Take 2 capsules (50 mg total) by mouth 3 (three) times daily as needed for withdrawal. 50mg  by mouth q 6 h day 1 50mg  by mouth q 8 h day 2 50mg  by mouth q 12 h day 3 50mg  by mouth QHS  day 4 20 capsule 0  . furosemide (LASIX) 20 MG tablet Take by mouth.    . tamsulosin (FLOMAX) 0.4 MG CAPS capsule Take 0.4 mg by mouth daily.     No current facility-administered medications for this visit.     Objective: Vital signs in last 24 hours: BP (!) 142/81   Pulse 72   Temp 98.2 F (36.8 C)   Ht 5\' 7"  (1.702 m)   Wt 135 lb (61.2 kg)   BMI 21.14 kg/m   Intake/Output from previous day: No intake/output data recorded. Intake/Output this shift: @IOTHISSHIFT @   Physical Exam Vitals reviewed.  Constitutional:      Appearance: Normal appearance.  Abdominal:     General: Abdomen is flat.     Palpations: Abdomen is soft. There is no mass.     Tenderness: There is no abdominal tenderness.     Hernia: A hernia (small LIH) is present.  Genitourinary:    Comments: Uncirc phallus with adequate meatus. Scrotum, testes and epididymis normal. AP without lesions. NST without mass. Prostate smooth and flat without nodules. SV non-palpable.  Musculoskeletal:        General: No swelling  or tenderness. Normal range of motion.  Skin:    General: Skin is warm and dry.  Neurological:     General: No focal deficit present.     Mental Status: He is alert.     Comments: He responds appropriately but has slowed mentation.   Psychiatric:        Mood and Affect: Mood normal.        Behavior: Behavior normal.     Lab Results:  Results for orders placed or performed in visit on 01/09/21 (from the past 24 hour(s))  Urinalysis, Routine w reflex microscopic     Status: Abnormal   Collection Time: 01/09/21 11:23 AM  Result Value Ref Range   Specific Gravity, UA 1.015 1.005 - 1.030   pH, UA 6.0 5.0 - 7.5   Color, UA Yellow Yellow   Appearance Ur Cloudy (A) Clear   Leukocytes,UA 2+ (A) Negative   Protein,UA Negative Negative/Trace   Glucose, UA Negative Negative   Ketones, UA Negative Negative   RBC, UA Trace (A) Negative   Bilirubin, UA Negative Negative   Urobilinogen, Ur 0.2 0.2 - 1.0 mg/dL   Nitrite, UA Negative Negative   Microscopic Examination See below:    Narrative   Performed at:  [facility] Director: [facility] [place], Phone:  [phone]  Microscopic Examination     Status: Abnormal   Collection Time: 01/09/21 11:23 AM   Urine  Result Value Ref Range   WBC, UA 11-30 (A) 0 - 5 /hpf   RBC 0-2 0 - 2 /hpf   Epithelial Cells (non renal) 0-10 0 - 10 /hpf   Bacteria, UA Many (A) None seen/Few   Narrative   Performed at:  [facility] Director: [facility], Phone:  [phone]    BMET No results for input(s): NA, K, CL, CO2, GLUCOSE, BUN, CREATININE, CALCIUM in the last 72 hours. PT/INR No results for input(s): LABPROT, INR in the last 72 hours. ABG No results for input(s): PHART, HCO3 in the last 72 hours.  Invalid input(s): PCO2, PO2 UA has pyuria and many bacteria but is nit-.  Studies/Results: I have reviewed his labs and  CT films from his recent  ER visit and I have reviewed notes and labs from Dr. Sherrie Jimenez and from Dr. Gloriann Jimenez.    Assessment/Plan: Elevated PSA.   His prostate is benign on exam and he appears to have a UTI today.   I will repeat a PSA in 3 months after treatment of the UTI, but I would be reluctant to consider a prostate biopsy unless his PSA continues to rise significantly.  BPH with BOO and OAB.  He will continue the tamsulosin for now.  Some of the OAB symptoms could be related to the UTI.  UTI.  He has pyuria bacteriuria.  Culture sent.  I will treat if positive.    No orders of the defined types were placed in this encounter.    Orders Placed This Encounter  Procedures  . Urine Culture  . Microscopic Examination  . Urinalysis, Routine w reflex microscopic  . PSA, total and free    Standing Status:   Future    Standing Expiration Date:   07/12/2021  . Bladder scan     Return in about 3 months (around 04/11/2021) for with PSA.Marland Kitchen    CC: Dr. Stoney Jimenez.      Irine Seal 01/09/2021 [phone]

## 2021-01-09 NOTE — Progress Notes (Signed)
post void residual= 47  Urological Symptom Review  Patient is experiencing the following symptoms: Frequent urination Hard to postpone urination Get up at night to urinate Leakage of urine Stream starts and stops Trouble starting stream   Review of Systems  Gastrointestinal (upper)  : Negative for upper GI symptoms  Gastrointestinal (lower) : Negative for lower GI symptoms  Constitutional : Negative for symptoms  Skin: Negative for skin symptoms  Eyes: Blurred vision  Ear/Nose/Throat : Negative for Ear/Nose/Throat symptoms  Hematologic/Lymphatic: Negative for Hematologic/Lymphatic symptoms  Cardiovascular : Leg swelling  Respiratory : Negative for respiratory symptoms  Endocrine: Negative for endocrine symptoms  Musculoskeletal: Negative for musculoskeletal symptoms  Neurological: Negative for neurological symptoms  Psychologic: Negative for psychiatric symptoms

## 2021-01-14 DIAGNOSIS — U071 COVID-19: Secondary | ICD-10-CM | POA: Diagnosis not present

## 2021-01-14 DIAGNOSIS — Z20828 Contact with and (suspected) exposure to other viral communicable diseases: Secondary | ICD-10-CM | POA: Diagnosis not present

## 2021-01-15 ENCOUNTER — Telehealth: Payer: Self-pay

## 2021-01-15 DIAGNOSIS — N390 Urinary tract infection, site not specified: Secondary | ICD-10-CM

## 2021-01-15 LAB — URINE CULTURE

## 2021-01-15 MED ORDER — SULFAMETHOXAZOLE-TRIMETHOPRIM 800-160 MG PO TABS: 1.0000 | ORAL_TABLET | Freq: Two times a day (BID) | ORAL | 0 refills | Status: DC

## 2021-01-15 NOTE — Telephone Encounter (Signed)
-----   Message from Cleon Gustin, MD sent at 01/15/2021  4:20 PM EDT ----- Bactrim Ds BID for 7 days ----- Message ----- From: Dorisann Frames, RN Sent: 01/15/2021  11:55 AM EDT To: Irine Seal, MD, Cleon Gustin, MD  Dr. Alyson Ingles- please review in Dr. Jeffie Pollock absence

## 2021-01-15 NOTE — Telephone Encounter (Signed)
Rx sent. [name] called and made aware.

## 2021-01-16 ENCOUNTER — Telehealth: Payer: Self-pay

## 2021-01-16 ENCOUNTER — Other Ambulatory Visit: Payer: Self-pay

## 2021-01-16 DIAGNOSIS — N390 Urinary tract infection, site not specified: Secondary | ICD-10-CM

## 2021-01-16 NOTE — Telephone Encounter (Signed)
Spoke with Baker Janus at [facility] was able to retrieve medication from [facility].

## 2021-01-16 NOTE — Telephone Encounter (Signed)
[name] - nephew of patient called.  Patient's antibiotic medication needs to be sent to  [facility].  [name] could not take medication from [place] to give to [facility] where patient is living now.  Patient has not started medication due to needing it sent to new pharmacy.  Thanks, Helene Kelp

## 2021-01-20 ENCOUNTER — Telehealth: Payer: Self-pay

## 2021-01-20 DIAGNOSIS — N410 Acute prostatitis: Secondary | ICD-10-CM

## 2021-01-20 DIAGNOSIS — N411 Chronic prostatitis: Secondary | ICD-10-CM

## 2021-01-20 MED ORDER — SULFAMETHOXAZOLE-TRIMETHOPRIM 800-160 MG PO TABS: 1.0000 | ORAL_TABLET | Freq: Two times a day (BID) | ORAL | 0 refills | Status: DC

## 2021-01-20 NOTE — Telephone Encounter (Signed)
Rx sent, Baker Janus from [facility] called and made aware of new rx sent to pharmacy.

## 2021-01-20 NOTE — Telephone Encounter (Signed)
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

## 2021-01-21 DIAGNOSIS — Z20828 Contact with and (suspected) exposure to other viral communicable diseases: Secondary | ICD-10-CM | POA: Diagnosis not present

## 2021-01-21 DIAGNOSIS — U071 COVID-19: Secondary | ICD-10-CM | POA: Diagnosis not present

## 2021-01-23 ENCOUNTER — Other Ambulatory Visit: Payer: Medicare HMO | Admitting: Urology

## 2021-01-28 DIAGNOSIS — U071 COVID-19: Secondary | ICD-10-CM | POA: Diagnosis not present

## 2021-01-28 DIAGNOSIS — Z20828 Contact with and (suspected) exposure to other viral communicable diseases: Secondary | ICD-10-CM | POA: Diagnosis not present

## 2021-02-05 DIAGNOSIS — U071 COVID-19: Secondary | ICD-10-CM | POA: Diagnosis not present

## 2021-02-05 DIAGNOSIS — Z20828 Contact with and (suspected) exposure to other viral communicable diseases: Secondary | ICD-10-CM | POA: Diagnosis not present

## 2021-02-10 ENCOUNTER — Other Ambulatory Visit: Payer: Self-pay

## 2021-02-10 ENCOUNTER — Encounter (HOSPITAL_COMMUNITY): Payer: Self-pay | Admitting: Emergency Medicine

## 2021-02-10 ENCOUNTER — Emergency Department (HOSPITAL_COMMUNITY): Payer: Medicare HMO

## 2021-02-10 ENCOUNTER — Inpatient Hospital Stay (HOSPITAL_COMMUNITY)
Admission: EM | Admit: 2021-02-10 | Discharge: 2021-02-18 | DRG: 330 | Disposition: A | Discharge status: Skilled Nursing Facility | Payer: Medicare HMO | Attending: Internal Medicine | Admitting: Internal Medicine

## 2021-02-10 DIAGNOSIS — K562 Volvulus: Secondary | ICD-10-CM | POA: Diagnosis not present

## 2021-02-10 DIAGNOSIS — K3189 Other diseases of stomach and duodenum: Secondary | ICD-10-CM | POA: Diagnosis not present

## 2021-02-10 DIAGNOSIS — E860 Dehydration: Secondary | ICD-10-CM | POA: Diagnosis present

## 2021-02-10 DIAGNOSIS — I119 Hypertensive heart disease without heart failure: Secondary | ICD-10-CM | POA: Diagnosis present

## 2021-02-10 DIAGNOSIS — I10 Essential (primary) hypertension: Secondary | ICD-10-CM | POA: Diagnosis not present

## 2021-02-10 DIAGNOSIS — Z800 Family history of malignant neoplasm of digestive organs: Secondary | ICD-10-CM

## 2021-02-10 DIAGNOSIS — R269 Unspecified abnormalities of gait and mobility: Secondary | ICD-10-CM | POA: Diagnosis present

## 2021-02-10 DIAGNOSIS — D72829 Elevated white blood cell count, unspecified: Secondary | ICD-10-CM | POA: Diagnosis not present

## 2021-02-10 DIAGNOSIS — I700 Atherosclerosis of aorta: Secondary | ICD-10-CM | POA: Diagnosis not present

## 2021-02-10 DIAGNOSIS — I4891 Unspecified atrial fibrillation: Secondary | ICD-10-CM | POA: Diagnosis not present

## 2021-02-10 DIAGNOSIS — R69 Illness, unspecified: Secondary | ICD-10-CM | POA: Diagnosis not present

## 2021-02-10 DIAGNOSIS — I452 Bifascicular block: Secondary | ICD-10-CM | POA: Diagnosis present

## 2021-02-10 DIAGNOSIS — F17210 Nicotine dependence, cigarettes, uncomplicated: Secondary | ICD-10-CM | POA: Diagnosis present

## 2021-02-10 DIAGNOSIS — Z20828 Contact with and (suspected) exposure to other viral communicable diseases: Secondary | ICD-10-CM | POA: Diagnosis not present

## 2021-02-10 DIAGNOSIS — I4510 Unspecified right bundle-branch block: Secondary | ICD-10-CM | POA: Diagnosis present

## 2021-02-10 DIAGNOSIS — I483 Typical atrial flutter: Secondary | ICD-10-CM

## 2021-02-10 DIAGNOSIS — N281 Cyst of kidney, acquired: Secondary | ICD-10-CM | POA: Diagnosis not present

## 2021-02-10 DIAGNOSIS — F1010 Alcohol abuse, uncomplicated: Secondary | ICD-10-CM | POA: Diagnosis present

## 2021-02-10 DIAGNOSIS — Z01810 Encounter for preprocedural cardiovascular examination: Secondary | ICD-10-CM | POA: Diagnosis not present

## 2021-02-10 DIAGNOSIS — Z9119 Patient's noncompliance with other medical treatment and regimen: Secondary | ICD-10-CM

## 2021-02-10 DIAGNOSIS — H409 Unspecified glaucoma: Secondary | ICD-10-CM | POA: Diagnosis present

## 2021-02-10 DIAGNOSIS — F05 Delirium due to known physiological condition: Secondary | ICD-10-CM | POA: Diagnosis present

## 2021-02-10 DIAGNOSIS — R443 Hallucinations, unspecified: Secondary | ICD-10-CM | POA: Diagnosis present

## 2021-02-10 DIAGNOSIS — I4892 Unspecified atrial flutter: Secondary | ICD-10-CM | POA: Diagnosis not present

## 2021-02-10 DIAGNOSIS — N179 Acute kidney failure, unspecified: Secondary | ICD-10-CM | POA: Diagnosis present

## 2021-02-10 DIAGNOSIS — G47411 Narcolepsy with cataplexy: Secondary | ICD-10-CM | POA: Diagnosis not present

## 2021-02-10 DIAGNOSIS — D3500 Benign neoplasm of unspecified adrenal gland: Secondary | ICD-10-CM | POA: Diagnosis not present

## 2021-02-10 DIAGNOSIS — Z20822 Contact with and (suspected) exposure to covid-19: Secondary | ICD-10-CM | POA: Diagnosis present

## 2021-02-10 DIAGNOSIS — Z79899 Other long term (current) drug therapy: Secondary | ICD-10-CM

## 2021-02-10 DIAGNOSIS — K5900 Constipation, unspecified: Secondary | ICD-10-CM | POA: Diagnosis not present

## 2021-02-10 DIAGNOSIS — F1210 Cannabis abuse, uncomplicated: Secondary | ICD-10-CM | POA: Diagnosis present

## 2021-02-10 DIAGNOSIS — U071 COVID-19: Secondary | ICD-10-CM | POA: Diagnosis not present

## 2021-02-10 DIAGNOSIS — R410 Disorientation, unspecified: Secondary | ICD-10-CM | POA: Diagnosis not present

## 2021-02-10 LAB — CBC WITH DIFFERENTIAL/PLATELET
Abs Immature Granulocytes: 0.02 10*3/uL (ref 0.00–0.07)
Basophils Absolute: 0 10*3/uL (ref 0.0–0.1)
Basophils Relative: 0 %
Eosinophils Absolute: 0.1 10*3/uL (ref 0.0–0.5)
Eosinophils Relative: 2 %
HCT: 49.2 % (ref 39.0–52.0)
Hemoglobin: 16.2 g/dL (ref 13.0–17.0)
Immature Granulocytes: 0 %
Lymphocytes Relative: 35 %
Lymphs Abs: 2.5 10*3/uL (ref 0.7–4.0)
MCH: 31.6 pg (ref 26.0–34.0)
MCHC: 32.9 g/dL (ref 30.0–36.0)
MCV: 95.9 fL (ref 80.0–100.0)
Monocytes Absolute: 0.5 10*3/uL (ref 0.1–1.0)
Monocytes Relative: 8 %
Neutro Abs: 3.9 10*3/uL (ref 1.7–7.7)
Neutrophils Relative %: 55 %
Platelets: 192 10*3/uL (ref 150–400)
RBC: 5.13 MIL/uL (ref 4.22–5.81)
RDW: 13.6 % (ref 11.5–15.5)
WBC: 7.1 10*3/uL (ref 4.0–10.5)
nRBC: 0 % (ref 0.0–0.2)

## 2021-02-10 LAB — URINALYSIS, ROUTINE W REFLEX MICROSCOPIC
Bilirubin Urine: NEGATIVE
Glucose, UA: NEGATIVE mg/dL
Hgb urine dipstick: NEGATIVE
Ketones, ur: NEGATIVE mg/dL
Leukocytes,Ua: NEGATIVE
Nitrite: NEGATIVE
Protein, ur: NEGATIVE mg/dL
Specific Gravity, Urine: 1.005 (ref 1.005–1.030)
pH: 5 (ref 5.0–8.0)

## 2021-02-10 LAB — COMPREHENSIVE METABOLIC PANEL
ALT: 32 U/L (ref 0–44)
AST: 22 U/L (ref 15–41)
Albumin: 4.8 g/dL (ref 3.5–5.0)
Alkaline Phosphatase: 89 U/L (ref 38–126)
Anion gap: 8 (ref 5–15)
BUN: 13 mg/dL (ref 8–23)
CO2: 25 mmol/L (ref 22–32)
Calcium: 9.4 mg/dL (ref 8.9–10.3)
Chloride: 101 mmol/L (ref 98–111)
Creatinine, Ser: 1.38 mg/dL — ABNORMAL HIGH (ref 0.61–1.24)
GFR, Estimated: 55 mL/min — ABNORMAL LOW (ref >60)
Glucose, Bld: 104 mg/dL — ABNORMAL HIGH (ref 70–99)
Potassium: 4.3 mmol/L (ref 3.5–5.1)
Sodium: 134 mmol/L — ABNORMAL LOW (ref 135–145)
Total Bilirubin: 0.5 mg/dL (ref 0.3–1.2)
Total Protein: 8.2 g/dL — ABNORMAL HIGH (ref 6.5–8.1)

## 2021-02-10 LAB — ETHANOL: Alcohol, Ethyl (B): <10 mg/dL (ref <10)

## 2021-02-10 LAB — MAGNESIUM: Magnesium: 2.2 mg/dL (ref 1.7–2.4)

## 2021-02-10 LAB — PHOSPHORUS: Phosphorus: 3.8 mg/dL (ref 2.5–4.6)

## 2021-02-10 LAB — LIPASE, BLOOD: Lipase: 38 U/L (ref 11–51)

## 2021-02-10 MED ORDER — ONDANSETRON HCL 4 MG PO TABS: 4.0000 mg | ORAL_TABLET | Freq: Four times a day (QID) | ORAL | Status: DC | PRN

## 2021-02-10 MED ORDER — SODIUM CHLORIDE 0.9 % IV BOLUS
1000.0000 mL | Freq: Once | INTRAVENOUS | Status: AC
  Administered 2021-02-10: 1000 mL via INTRAVENOUS

## 2021-02-10 MED ORDER — THIAMINE HCL 100 MG PO TABS
100.0000 mg | ORAL_TABLET | Freq: Every day | ORAL | Status: DC
  Administered 2021-02-11 – 2021-02-18 (×7): 100 mg via ORAL
  Filled 2021-02-10 (×8): qty 1

## 2021-02-10 MED ORDER — ACETAMINOPHEN 325 MG PO TABS
650.0000 mg | ORAL_TABLET | Freq: Four times a day (QID) | ORAL | Status: DC | PRN
  Administered 2021-02-16: 650 mg via ORAL
  Filled 2021-02-10: qty 2

## 2021-02-10 MED ORDER — ONDANSETRON HCL 4 MG/2ML IJ SOLN: 4.0000 mg | Freq: Four times a day (QID) | INTRAMUSCULAR | Status: DC | PRN

## 2021-02-10 MED ORDER — FOLIC ACID 1 MG PO TABS
1.0000 mg | ORAL_TABLET | Freq: Every day | ORAL | Status: DC
  Administered 2021-02-11 – 2021-02-18 (×7): 1 mg via ORAL
  Filled 2021-02-10 (×8): qty 1

## 2021-02-10 MED ORDER — ADULT MULTIVITAMIN W/MINERALS CH
1.0000 | ORAL_TABLET | Freq: Every day | ORAL | Status: DC
  Administered 2021-02-11 – 2021-02-18 (×7): 1 via ORAL
  Filled 2021-02-10 (×8): qty 1

## 2021-02-10 MED ORDER — SODIUM CHLORIDE 0.9 % IV SOLN: INTRAVENOUS | Status: DC

## 2021-02-10 MED ORDER — MORPHINE SULFATE (PF) 4 MG/ML IV SOLN
4.0000 mg | Freq: Once | INTRAVENOUS | Status: AC
Start: 2021-02-10 — End: 2021-02-10
  Administered 2021-02-10: 4 mg via INTRAVENOUS
  Filled 2021-02-10: qty 1

## 2021-02-10 MED ORDER — THIAMINE HCL 100 MG/ML IJ SOLN
100.0000 mg | Freq: Every day | INTRAMUSCULAR | Status: DC
  Administered 2021-02-12: 100 mg via INTRAVENOUS
  Filled 2021-02-10: qty 2

## 2021-02-10 MED ORDER — LORAZEPAM 2 MG/ML IJ SOLN: 1.0000 mg | INTRAMUSCULAR | Status: AC | PRN

## 2021-02-10 MED ORDER — MORPHINE SULFATE (PF) 2 MG/ML IV SOLN
2.0000 mg | INTRAVENOUS | Status: DC | PRN
  Administered 2021-02-11 – 2021-02-12 (×2): 2 mg via INTRAVENOUS
  Filled 2021-02-10 (×2): qty 1

## 2021-02-10 MED ORDER — LORAZEPAM 1 MG PO TABS: 1.0000 mg | ORAL_TABLET | ORAL | Status: AC | PRN

## 2021-02-10 MED ORDER — ACETAMINOPHEN 650 MG RE SUPP: 650.0000 mg | Freq: Four times a day (QID) | RECTAL | Status: DC | PRN

## 2021-02-10 MED ORDER — DEXTROSE-NACL 5-0.9 % IV SOLN: INTRAVENOUS | Status: DC

## 2021-02-10 MED ORDER — ONDANSETRON HCL 4 MG/2ML IJ SOLN
4.0000 mg | Freq: Once | INTRAMUSCULAR | Status: DC
  Filled 2021-02-10: qty 2

## 2021-02-10 NOTE — ED Triage Notes (Signed)
Pt states he has been constipated for the last few months.

## 2021-02-10 NOTE — H&P (Signed)
History and Physical    Marc Jimenez YHC:[id number] DOB: Jan 31, 1951 DOA: 02/10/2021  PCP: [name], MD   Patient coming from: Home  I have personally briefly reviewed patient's old medical records in [place]  Chief Complaint: Abdominal Pain  HPI: Marc Jimenez is a 70 y.o. male with medical history significant for alcohol abuse, narcolepsy, hypertension, heart disease. Patient is a poor historian.  Patient presented to the ED with complaints of increasing abdominal pain over the past few days, decreased bowel movements and worsening poor appetite.  He denies vomiting.  No chest pain.  He agrees this has happened before, but he cannot give any details.  Per care everywhere - patient was admitted in March- 3/2 - 3/4 2022 this year, with similar symptoms, at [facility], CT showed sigmoid volvulus , he was promptly taken for endoscopic decompression which was successful 3/2.  Plan was for patient to undergo bowel prep followed by an open sigmoidectomy to prevent recurrence of the sigmoid volvulus.  Patient had cardiac evaluation, and was determined to be moderate to severe risk for perioperative complications.  Patient did not take the bowel prep, and as this put him at potential risk for postop wound complications as well as anastomotic leak, surgery was canceled, and he was discharged to follow-up in 2 to 3 weeks for consideration of sigmoidectomy and possible further cardiac work-up to stratify his risk.  ED Course: Stable vitals.  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  This appearance is recurrent but improved when compared to prior. EDP talked to general surgery and gastroenterologist on-call, Dr. Arnoldo Morale and Dr. Abbey Chatters, no need for emergent decompression, recommended pain control IV fluids n.p.o. midnight.  Will see in consult.  Review of Systems: As per HPI all other systems reviewed and negative.  Past Medical History:   Diagnosis Date  . Alcohol abuse   . Arrhythmia   . Cataplexy   . Glaucoma   . Heart disease   . Hypertension   . Narcolepsy   . Narcolepsy     Past Surgical History:  Procedure Laterality Date  . HERNIA REPAIR       reports that he has been smoking. He has a 30.00 pack-year smoking history. He has never used smokeless tobacco. He reports current alcohol use of about 21.0 standard drinks of alcohol per week. He reports current drug use. Drug: Marijuana.  No Known Allergies  Positive family history of colon cancer  Prior to Admission medications   Medication Sig Start Date End Date Taking? Authorizing Provider  amLODipine (NORVASC) 5 MG tablet Take 1 tablet by mouth daily. 01/07/21   [provider]  chlordiazePOXIDE (LIBRIUM) 25 MG capsule Take 2 capsules (50 mg total) by mouth 3 (three) times daily as needed for withdrawal. 50mg  by mouth q 6 h day 1 50mg  by mouth q 8 h day 2 50mg  by mouth q 12 h day 3 50mg  by mouth QHS day 4 03/15/18   Little, Wenda Overland, MD  furosemide (LASIX) 20 MG tablet Take by mouth. 01/07/21   [provider]  sulfamethoxazole-trimethoprim (BACTRIM DS) 800-160 MG tablet Take 1 tablet by mouth 2 (two) times daily. 01/15/21   McKenzie, Candee Furbish, MD  sulfamethoxazole-trimethoprim (BACTRIM DS) 800-160 MG tablet Take 1 tablet by mouth 2 (two) times daily. 01/20/21   Irine Seal, MD  tamsulosin (FLOMAX) 0.4 MG CAPS capsule Take 0.4 mg by mouth daily. 01/07/21   [provider]  Physical Exam: Vitals:   02/10/21 1459 02/10/21 1500 02/10/21 1950 02/10/21 2105  BP: (!) 135/100  (!) 143/93 (!) 140/103  Pulse: 73  71 70  Resp: 16  18 16   Temp: 98.2 F (36.8 C)     TempSrc: Oral     SpO2: 98%  100% 96%  Weight:  62.6 kg    Height:  5\' 7"  (1.702 m)      Constitutional: NAD, calm, comfortable Vitals:   02/10/21 1459 02/10/21 1500 02/10/21 1950 02/10/21 2105  BP: (!) 135/100  (!) 143/93 (!) 140/103  Pulse: 73  71 70  Resp: 16  18  16   Temp: 98.2 F (36.8 C)     TempSrc: Oral     SpO2: 98%  100% 96%  Weight:  62.6 kg    Height:  5\' 7"  (1.702 m)     Eyes: PERRL, lids and conjunctivae normal ENMT: Mucous membranes are moist. Neck: normal, supple, no masses, no thyromegaly Respiratory: clear to auscultation bilaterally, no wheezing, no crackles. Normal respiratory effort. No accessory muscle use.  Cardiovascular: Regular rhythm, no extremity edema. 2+ pedal pulses.  Abdomen: no tenderness, no masses palpated. No hepatosplenomegaly. Bowel sounds positive.  Musculoskeletal: no clubbing / cyanosis. No joint deformity upper and lower extremities. Good ROM, no contractures. Normal muscle tone.  Skin: no rashes, lesions, ulcers. No induration Neurologic: No apparent cranial abnormality, moving EXTR spontaneously. Psychiatric: Normal judgment and insight. Alert and oriented x 3. Normal mood.   Labs on Admission: I have personally reviewed following labs and imaging studies  CBC: Recent Labs  Lab 02/10/21 1723  WBC 7.1  NEUTROABS 3.9  HGB 16.2  HCT 49.2  MCV 95.9  PLT 564   Basic Metabolic Panel: Recent Labs  Lab 02/10/21 1723  NA 134*  K 4.3  CL 101  CO2 25  GLUCOSE 104*  BUN 13  CREATININE 1.38*  CALCIUM 9.4   Liver Function Tests: Recent Labs  Lab 02/10/21 1723  AST 22  ALT 32  ALKPHOS 89  BILITOT 0.5  PROT 8.2*  ALBUMIN 4.8   Recent Labs  Lab 02/10/21 1723  LIPASE 38    Radiological Exams on Admission: CT ABDOMEN PELVIS WO CONTRAST  Result Date: 02/10/2021 CLINICAL DATA:  Abdominal pain, constipation EXAM: CT ABDOMEN AND PELVIS WITHOUT CONTRAST TECHNIQUE: Multidetector CT imaging of the abdomen and pelvis was performed following the standard protocol without IV contrast. COMPARISON:  11/06/2020 FINDINGS: Lower chest: Lung bases are essentially clear. Hepatobiliary: Unenhanced liver is unremarkable. Gallbladder is unremarkable. No intrahepatic or extrahepatic ductal dilatation. Pancreas:  Within normal limits. Spleen: Within normal limits. Adrenals/Urinary Tract: 19 mm left adrenal adenoma (series 2/image 19). Right adrenal gland is within normal limits. Two right renal cysts measuring up to 4.0 cm in the right lower pole (series 2/image 30). Left kidney is within normal limits. No renal calculi or hydronephrosis. Mildly thick-walled bladder, although underdistended. Stomach/Bowel: Stomach is within normal limits. No evidence of small bowel obstruction. Dilated sigmoid colon with narrowing in the central mesenteric root (series 2/image 44). This is recurrent but less severe when compared to the prior. Sigmoid colon measures up to 6.2 cm on the current study, without wall thickening or mesenteric edema. Vascular/Lymphatic: No evidence of abdominal aortic aneurysm. Atherosclerotic calcifications of the abdominal aorta and branch vessels. No suspicious abdominopelvic lymphadenopathy. Reproductive: Prostatomegaly, suggesting BPH. Other: No abdominopelvic ascites. Musculoskeletal: Degenerative changes of the visualized thoracolumbar spine. 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 This appearance  is recurrent but improved when compared to the prior. Additional ancillary findings as above. Electronically Signed   By: Julian Hy M.D.   On: 02/10/2021 19:39    EKG: None.  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 This appearance is recurrent but improved when compared to the prior.  Hospitalization 11/2020 for same see details in history.  Underwent endoscopic decompression.  Sigmoidectomy was recommended to prevent recurrence, patient was ambivalent,  would not drink bowel prep so was discharged. -N.p.o. -IV morphine 2 mg as needed  - 1 L bolus N/s given , cont D5 N/s 90VQ/QU    Alcoholic abuse-reports last drink was a week ago, drinks beer.  Denies frequent or daily intake. -CIWA as needed - Thiamine,  folate multivitamin - Magnesium, phosphorus  Hypertension-stable. -Hold Norvasc, Lasix,  Right bundle branch block-Per care everywhere -Obtain EKG  DVT prophylaxis: SCDs Code Status: Full code Family Communication: None at bedside Disposition Plan: ~ 2 days. Consults called: General surgery, gastroenterology Admission status: Inpatient, MedSurg I certify that at the point of admission it is my clinical judgment that the patient will require inpatient hospital care spanning beyond 2 midnights from the point of admission due to high intensity of service, high risk for further deterioration and high frequency of surveillance required.    Bethena Roys MD [facility]  02/10/2021, 10:07 PM

## 2021-02-10 NOTE — ED Provider Notes (Signed)
[facility] EMERGENCY DEPARTMENT Provider Note   CSN: [id number] Arrival date & time: 02/10/21  1348     History Chief Complaint  Patient presents with  . Constipation    Marc Jimenez is a 70 y.o. male.  HPI Patient presents with abdominal pain, difficulty with bowel movements. He notes that over the past 3 days he has had minimal bowel movements, increasing anorexia, nausea, and diffuse abdominal pain.  No fever, chills, chest pain, dyspnea. He notes 1 prior similar event, months ago that improved without specific intervention.     Past Medical History:  Diagnosis Date  . Alcohol abuse   . Arrhythmia   . Cataplexy   . Glaucoma   . Heart disease   . Hypertension   . Narcolepsy   . Narcolepsy     There are no problems to display for this patient.   Past Surgical History:  Procedure Laterality Date  . HERNIA REPAIR         History reviewed. No pertinent family history.  Social History   Tobacco Use  . Smoking status: Current Every Day Smoker    Packs/day: 1.00    Years: 30.00    Pack years: 30.00  . Smokeless tobacco: Never Used  Vaping Use  . Vaping Use: Never used  Substance Use Topics  . Alcohol use: Yes    Alcohol/week: 21.0 standard drinks    Types: 21 Cans of beer per week    Comment: 1 pint of liquor every day  . Drug use: Yes    Types: Marijuana    Home Medications Prior to Admission medications   Medication Sig Start Date End Date Taking? Authorizing Provider  amLODipine (NORVASC) 5 MG tablet Take 1 tablet by mouth daily. 01/07/21   [provider]  chlordiazePOXIDE (LIBRIUM) 25 MG capsule Take 2 capsules (50 mg total) by mouth 3 (three) times daily as needed for withdrawal. 50mg  by mouth q 6 h day 1 50mg  by mouth q 8 h day 2 50mg  by mouth q 12 h day 3 50mg  by mouth QHS day 4 03/15/18   Little, Wenda Overland, MD  furosemide (LASIX) 20 MG tablet Take by mouth. 01/07/21   [provider]  sulfamethoxazole-trimethoprim  (BACTRIM DS) 800-160 MG tablet Take 1 tablet by mouth 2 (two) times daily. 01/15/21   McKenzie, Candee Furbish, MD  sulfamethoxazole-trimethoprim (BACTRIM DS) 800-160 MG tablet Take 1 tablet by mouth 2 (two) times daily. 01/20/21   Irine Seal, MD  tamsulosin (FLOMAX) 0.4 MG CAPS capsule Take 0.4 mg by mouth daily. 01/07/21   [provider]    Allergies    Patient has no known allergies.  Review of Systems   Review of Systems  Constitutional:       Per HPI, otherwise negative  HENT:       Per HPI, otherwise negative  Respiratory:       Per HPI, otherwise negative  Cardiovascular:       Per HPI, otherwise negative  Gastrointestinal: Positive for abdominal pain and nausea. Negative for vomiting.  Endocrine:       Negative aside from HPI  Genitourinary:       Neg aside from HPI   Musculoskeletal:       Per HPI, otherwise negative  Skin: Negative.   Neurological: Negative for syncope.    Physical Exam Updated Vital Signs BP (!) 140/103 (BP Location: Left Arm)   Pulse 70   Temp 98.2 F (36.8 C) (Oral)  Resp 16   Ht 5\' 7"  (1.702 m)   Wt 62.6 kg   SpO2 96%   BMI 21.61 kg/m   Physical Exam Vitals and nursing note reviewed.  Constitutional:      General: He is not in acute distress.    Appearance: He is well-developed.  HENT:     Head: Normocephalic and atraumatic.  Eyes:     Conjunctiva/sclera: Conjunctivae normal.  Cardiovascular:     Rate and Rhythm: Normal rate and regular rhythm.  Pulmonary:     Effort: Pulmonary effort is normal. No respiratory distress.     Breath sounds: No stridor.  Abdominal:     General: There is no distension.     Tenderness: There is abdominal tenderness. There is guarding.  Skin:    General: Skin is warm and dry.  Neurological:     Mental Status: He is alert and oriented to person, place, and time.      ED Results / Procedures / Treatments   Labs (all labs ordered are listed, but only abnormal results are displayed) Labs  Reviewed  COMPREHENSIVE METABOLIC PANEL - Abnormal; Notable for the following components:      Result Value   Sodium 134 (*)    Glucose, Bld 104 (*)    Creatinine, Ser 1.38 (*)    Total Protein 8.2 (*)    GFR, Estimated 55 (*)    All other components within normal limits  SARS CORONAVIRUS 2 (TAT 6-24 HRS)  LIPASE, BLOOD  CBC WITH DIFFERENTIAL/PLATELET  URINALYSIS, ROUTINE W REFLEX MICROSCOPIC    EKG None  Radiology CT ABDOMEN PELVIS WO CONTRAST  Result Date: 02/10/2021 CLINICAL DATA:  Abdominal pain, constipation EXAM: CT ABDOMEN AND PELVIS WITHOUT CONTRAST TECHNIQUE: Multidetector CT imaging of the abdomen and pelvis was performed following the standard protocol without IV contrast. COMPARISON:  11/06/2020 FINDINGS: Lower chest: Lung bases are essentially clear. Hepatobiliary: Unenhanced liver is unremarkable. Gallbladder is unremarkable. No intrahepatic or extrahepatic ductal dilatation. Pancreas: Within normal limits. Spleen: Within normal limits. Adrenals/Urinary Tract: 19 mm left adrenal adenoma (series 2/image 19). Right adrenal gland is within normal limits. Two right renal cysts measuring up to 4.0 cm in the right lower pole (series 2/image 30). Left kidney is within normal limits. No renal calculi or hydronephrosis. Mildly thick-walled bladder, although underdistended. Stomach/Bowel: Stomach is within normal limits. No evidence of small bowel obstruction. Dilated sigmoid colon with narrowing in the central mesenteric root (series 2/image 44). This is recurrent but less severe when compared to the prior. Sigmoid colon measures up to 6.2 cm on the current study, without wall thickening or mesenteric edema. Vascular/Lymphatic: No evidence of abdominal aortic aneurysm. Atherosclerotic calcifications of the abdominal aorta and branch vessels. No suspicious abdominopelvic lymphadenopathy. Reproductive: Prostatomegaly, suggesting BPH. Other: No abdominopelvic ascites. Musculoskeletal:  Degenerative changes of the visualized thoracolumbar spine. 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 This appearance is recurrent but improved when compared to the prior. Additional ancillary findings as above. Electronically Signed   By: Julian Hy M.D.   On: 02/10/2021 19:39    Procedures Procedures   Medications Ordered in ED Medications  sodium chloride 0.9 % bolus 1,000 mL (1,000 mLs Intravenous New Bag/Given 02/10/21 2105)    And  0.9 %  sodium chloride infusion (has no administration in time range)  ondansetron (ZOFRAN) injection 4 mg (4 mg Intravenous Not Given 02/10/21 2101)  morphine 4 MG/ML injection 4 mg (has no administration in time range)  ED Course  I have reviewed the triage vital signs and the nursing notes.  Pertinent labs & imaging results that were available during my care of the patient were reviewed by me and considered in my medical decision making (see chart for details).   Update:, Patient accompanied by his nephew.  Nephew notes the patient has a history of sigmoid volvulus.  9:21 PM Have seen and reviewed the CT imaging, discussed it with the patient, and his nephew.  Subsequently I discussed findings with our GI colleagues, surgery colleagues.  Both services will follow as a consulting group, request internal medicine admission for sigmoid volvulus.  On repeat exam patient is awake and alert.  On he is found to have a slight renal dysfunction, but otherwise labs are somewhat reassuring. Patient will require additional analgesia, antiemetics, fluids will be admitted for further monitoring, management MDM Rules/Calculators/A&P MDM Number of Diagnoses or Management Options Sigmoid volvulus ([place]): established, worsening   Amount and/or Complexity of Data Reviewed Clinical lab tests: ordered and reviewed Tests in the radiology section of CPT: ordered and reviewed Tests in the medicine section of CPT: reviewed and  ordered Decide to obtain previous medical records or to obtain history from someone other than the patient: yes Obtain history from someone other than the patient: yes Review and summarize past medical records: yes Discuss the patient with other providers: yes Independent visualization of images, tracings, or specimens: yes  Risk of Complications, Morbidity, and/or Mortality Presenting problems: high Diagnostic procedures: high Management options: high  Critical Care Total time providing critical care: 30-74 minutes (40)  Patient Progress Patient progress: improved  Final Clinical Impression(s) / ED Diagnoses Final diagnoses:  Sigmoid volvulus ([place])     Carmin Muskrat, MD 02/10/21 2123

## 2021-02-10 NOTE — ED Notes (Signed)
ED TO INPATIENT HANDOFF REPORT  ED Nurse Name and Phone #:   S Name/Age/Gender Marc Jimenez 70 y.o. male Room/Bed: APFT22/APFT22  Code Status   Code Status: Not on file  Home/SNF/Other Nursing Home Patient oriented to: self Is this baseline? Yes   Triage Complete: Triage complete  Chief Complaint Sigmoid volvulus ([place]) [K56.2]  Triage Note Pt states he has been constipated for the last few months.    Allergies No Known Allergies  Level of Care/Admitting Diagnosis ED Disposition    ED Disposition Condition Comment   Admit  Hospital Area: [facility] [[id number]]  Level of Care: Med-Surg [16]  Covid Evaluation: Asymptomatic Screening Protocol (No Symptoms)  Diagnosis: Sigmoid volvulus [name] [[id number]]  Admitting Physician: Kara Pacer  Attending Physician: Bethena Roys [phone]  Estimated length of stay: past midnight tomorrow  Certification:: I certify this patient will need inpatient services for at least 2 midnights       B Medical/Surgery History Past Medical History:  Diagnosis Date  . Alcohol abuse   . Arrhythmia   . Cataplexy   . Glaucoma   . Heart disease   . Hypertension   . Narcolepsy   . Narcolepsy    Past Surgical History:  Procedure Laterality Date  . HERNIA REPAIR       A IV Location/Drains/Wounds Patient Lines/Drains/Airways Status    Active Line/Drains/Airways    Name Placement date Placement time Site Days   Peripheral IV 02/10/21 22 G 1" Right Forearm 02/10/21  2059  Forearm  less than 1          Intake/Output Last 24 hours No intake or output data in the 24 hours ending 02/10/21 2213  Labs/Imaging Results for orders placed or performed during the hospital encounter of 02/10/21 (from the past 48 hour(s))  Comprehensive metabolic panel     Status: Abnormal   Collection Time: 02/10/21  5:23 PM  Result Value Ref Range   Sodium 134 (L) 135 - 145 mmol/L   Potassium 4.3 3.5 - 5.1 mmol/L    Chloride 101 98 - 111 mmol/L   CO2 25 22 - 32 mmol/L   Glucose, Bld 104 (H) 70 - 99 mg/dL    Comment: Glucose reference range applies only to samples taken after fasting for at least 8 hours.   BUN 13 8 - 23 mg/dL   Creatinine, Ser 1.38 (H) 0.61 - 1.24 mg/dL   Calcium 9.4 8.9 - 10.3 mg/dL   Total Protein 8.2 (H) 6.5 - 8.1 g/dL   Albumin 4.8 3.5 - 5.0 g/dL   AST 22 15 - 41 U/L   ALT 32 0 - 44 U/L   Alkaline Phosphatase 89 38 - 126 U/L   Total Bilirubin 0.5 0.3 - 1.2 mg/dL   GFR, Estimated 55 (L) >60 mL/min    Comment: (NOTE) Calculated using the CKD-EPI Creatinine Equation (2021)    Anion gap 8 5 - 15    Comment: Performed at [facility], [address], [place],  [address]  Lipase, blood     Status: None   Collection Time: 02/10/21  5:23 PM  Result Value Ref Range   Lipase 38 11 - 51 U/L    Comment: Performed at [facility], [address] [place],  [address]  CBC WITH DIFFERENTIAL     Status: None   Collection Time: 02/10/21  5:23 PM  Result Value Ref Range   WBC 7.1 4.0 - 10.5 K/uL   RBC  5.13 4.22 - 5.81 MIL/uL   Hemoglobin 16.2 13.0 - 17.0 g/dL   HCT 49.2 39.0 - 52.0 %   MCV 95.9 80.0 - 100.0 fL   MCH 31.6 26.0 - 34.0 pg   MCHC 32.9 30.0 - 36.0 g/dL   RDW 13.6 11.5 - 15.5 %   Platelets 192 150 - 400 K/uL   nRBC 0.0 0.0 - 0.2 %   Neutrophils Relative % 55 %   Neutro Abs 3.9 1.7 - 7.7 K/uL   Lymphocytes Relative 35 %   Lymphs Abs 2.5 0.7 - 4.0 K/uL   Monocytes Relative 8 %   Monocytes Absolute 0.5 0.1 - 1.0 K/uL   Eosinophils Relative 2 %   Eosinophils Absolute 0.1 0.0 - 0.5 K/uL   Basophils Relative 0 %   Basophils Absolute 0.0 0.0 - 0.1 K/uL   Immature Granulocytes 0 %   Abs Immature Granulocytes 0.02 0.00 - 0.07 K/uL    Comment: Performed at [facility], [address], [place], [place] [address]  Urinalysis, Routine w reflex microscopic Urine, Clean Catch     Status: Abnormal   Collection Time: 02/10/21  9:24 PM  Result Value Ref Range   Color,  Urine STRAW (A) YELLOW   APPearance CLEAR CLEAR   Specific Gravity, Urine 1.005 1.005 - 1.030   pH 5.0 5.0 - 8.0   Glucose, UA NEGATIVE NEGATIVE mg/dL   Hgb urine dipstick NEGATIVE NEGATIVE   Bilirubin Urine NEGATIVE NEGATIVE   Ketones, ur NEGATIVE NEGATIVE mg/dL   Protein, ur NEGATIVE NEGATIVE mg/dL   Nitrite NEGATIVE NEGATIVE   Leukocytes,Ua NEGATIVE NEGATIVE    Comment: Performed at [facility], [address], [place], [place] [address]   CT ABDOMEN PELVIS WO CONTRAST  Result Date: 02/10/2021 CLINICAL DATA:  Abdominal pain, constipation EXAM: CT ABDOMEN AND PELVIS WITHOUT CONTRAST TECHNIQUE: Multidetector CT imaging of the abdomen and pelvis was performed following the standard protocol without IV contrast. COMPARISON:  11/06/2020 FINDINGS: Lower chest: Lung bases are essentially clear. Hepatobiliary: Unenhanced liver is unremarkable. Gallbladder is unremarkable. No intrahepatic or extrahepatic ductal dilatation. Pancreas: Within normal limits. Spleen: Within normal limits. Adrenals/Urinary Tract: 19 mm left adrenal adenoma (series 2/image 19). Right adrenal gland is within normal limits. Two right renal cysts measuring up to 4.0 cm in the right lower pole (series 2/image 30). Left kidney is within normal limits. No renal calculi or hydronephrosis. Mildly thick-walled bladder, although underdistended. Stomach/Bowel: Stomach is within normal limits. No evidence of small bowel obstruction. Dilated sigmoid colon with narrowing in the central mesenteric root (series 2/image 44). This is recurrent but less severe when compared to the prior. Sigmoid colon measures up to 6.2 cm on the current study, without wall thickening or mesenteric edema. Vascular/Lymphatic: No evidence of abdominal aortic aneurysm. Atherosclerotic calcifications of the abdominal aorta and branch vessels. No suspicious abdominopelvic lymphadenopathy. Reproductive: Prostatomegaly, suggesting BPH. Other: No abdominopelvic ascites.  Musculoskeletal: Degenerative changes of the visualized thoracolumbar spine. 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 This appearance is recurrent but improved when compared to the prior. Additional ancillary findings as above. Electronically Signed   By: Julian Hy M.D.   On: 02/10/2021 19:39    Pending Labs Unresulted Labs (From admission, onward)          Start     Ordered   02/10/21 2208  Magnesium  Add-on,   AD        02/10/21 2207   02/10/21 2208  Phosphorus  Add-on,  AD        02/10/21 2207   02/10/21 2154  Ethanol  Add-on,   AD        02/10/21 2153   02/10/21 2036  SARS CORONAVIRUS 2 (TAT 6-24 HRS) Nasopharyngeal Nasopharyngeal Swab  (Tier 3 - Symptomatic/asymptomatic)  Once,   STAT       Question Answer Comment  Is this test for diagnosis or screening Screening   Symptomatic for COVID-19 as defined by CDC No   Hospitalized for COVID-19 No   Admitted to ICU for COVID-19 No   Previously tested for COVID-19 Yes   Resident in a congregate (group) care setting Unknown   Employed in healthcare setting Unknown   Has patient completed COVID vaccination(s) (2 doses of Pfizer/Moderna 1 dose of [employer]) Unknown      02/10/21 2036   Signed and Held  HIV Antibody (routine testing w rflx)  (HIV Antibody (Routine testing w reflex) panel)  Once,   R        Signed and Held   Signed and Held  Basic metabolic panel  Tomorrow morning,   R        Signed and Held   Signed and Held  CBC  Tomorrow morning,   R        Signed and Held          Vitals/Pain Today's Vitals   02/10/21 1459 02/10/21 1500 02/10/21 1950 02/10/21 2105  BP: (!) 135/100  (!) 143/93 (!) 140/103  Pulse: 73  71 70  Resp: 16  18 16   Temp: 98.2 F (36.8 C)     TempSrc: Oral     SpO2: 98%  100% 96%  Weight:  62.6 kg    Height:  5\' 7"  (1.702 m)    PainSc:  0-No pain      Isolation Precautions No active isolations  Medications Medications  sodium chloride 0.9  % bolus 1,000 mL (1,000 mLs Intravenous New Bag/Given 02/10/21 2105)    And  0.9 %  sodium chloride infusion (has no administration in time range)  ondansetron (ZOFRAN) injection 4 mg (4 mg Intravenous Not Given 02/10/21 2101)  morphine 4 MG/ML injection 4 mg (4 mg Intravenous Given 02/10/21 2201)    Mobility walks with device Moderate fall risk   Focused Assessments    R Recommendations: See Admitting Provider Note  Report given to:   Additional Notes:

## 2021-02-11 LAB — HIV ANTIBODY (ROUTINE TESTING W REFLEX): HIV Screen 4th Generation wRfx: NONREACTIVE

## 2021-02-11 LAB — BASIC METABOLIC PANEL
Anion gap: 6 (ref 5–15)
BUN: 10 mg/dL (ref 8–23)
CO2: 22 mmol/L (ref 22–32)
Calcium: 8.7 mg/dL — ABNORMAL LOW (ref 8.9–10.3)
Chloride: 109 mmol/L (ref 98–111)
Creatinine, Ser: 1 mg/dL (ref 0.61–1.24)
GFR, Estimated: >60 mL/min (ref >60)
Glucose, Bld: 105 mg/dL — ABNORMAL HIGH (ref 70–99)
Potassium: 3.9 mmol/L (ref 3.5–5.1)
Sodium: 137 mmol/L (ref 135–145)

## 2021-02-11 LAB — CBC
HCT: 42.3 % (ref 39.0–52.0)
Hemoglobin: 14.3 g/dL (ref 13.0–17.0)
MCH: 31.8 pg (ref 26.0–34.0)
MCHC: 33.8 g/dL (ref 30.0–36.0)
MCV: 94 fL (ref 80.0–100.0)
Platelets: 185 10*3/uL (ref 150–400)
RBC: 4.5 MIL/uL (ref 4.22–5.81)
RDW: 13.3 % (ref 11.5–15.5)
WBC: 5.6 10*3/uL (ref 4.0–10.5)
nRBC: 0 % (ref 0.0–0.2)

## 2021-02-11 LAB — SARS CORONAVIRUS 2 (TAT 6-24 HRS): SARS Coronavirus 2: NEGATIVE

## 2021-02-11 MED ORDER — PEG 3350-KCL-NA BICARB-NACL 420 G PO SOLR
4000.0000 mL | Freq: Once | ORAL | Status: AC
  Administered 2021-02-11: 4000 mL via ORAL

## 2021-02-11 MED ORDER — AMLODIPINE BESYLATE 5 MG PO TABS
2.5000 mg | ORAL_TABLET | Freq: Every day | ORAL | Status: DC
  Administered 2021-02-11 – 2021-02-18 (×8): 2.5 mg via ORAL
  Filled 2021-02-11 (×9): qty 1

## 2021-02-11 NOTE — Progress Notes (Signed)
Patient Demographics:    Marc Jimenez, is a 70 y.o. male, DOB - 06/11/1951, FAO:[id number]  Admit date - 02/10/2021   Admitting Physician Ejiroghene Arlyce Dice, MD  Outpatient Primary MD for the patient is [name], MD  LOS - 1   Chief Complaint  Patient presents with  . Constipation        Subjective:    Marc Jimenez today has no fevers, no emesis,  No chest pain,   -Nephew Charles at bedside, patient having difficulty with BM  Assessment  & Plan :    Principal Problem:   Sigmoid volvulus ([place]) Active Problems:   Alcohol abuse   HTN (hypertension)  Brief Summary:- 70 y.o. male with medical history significant for alcohol abuse, narcolepsy, hypertension, heart disease who is a poor historian admitted on 02/10/2021 with recurrent sigmoid volvulus and AKI  1)Recurrent Sigmoid Colon Volvulus---Had prior colonoscopic reduction in March 2022 -- Patient previously refused surgical intervention -Patient is now willing to undergo surgery -Discussed with general surgeon GoLytely prep with sips started -No emesis  2)AKI----acute kidney injury --due to dehydration in the setting of poor oral intake due to volvulus and Lasix use Creatinine 1.38 >>1.0 -Overall resolved with hydration - renally adjust medications, avoid nephrotoxic agents / dehydration  / hypotension  3)Etoh Abuse-lorazepam per CIWA protocol continue multivitamin folic acid and thiamine - 4)HTN-IV labetalol as needed for elevated BP, continue to hold Lasix, restarted Norvasc  5)FEN-IV dextrose until oral intake is more reliable  6)Social/Ethics--patient is a poor historian, plan of care discussed with patient and his nephew [name] at bedside -Patient is a full code  Disposition/Need for in-Hospital Stay- patient unable to be discharged at this time due to -recurrent sigmoid volvulus requiring surgical intervention, poor oral  intake with AKI due to volvulus requiring IV fluids pending better tolerance of oral intake*  Status is: Inpatient  Remains inpatient appropriate because:Please see disposition above   Disposition: The patient is from: Home              Anticipated d/c is to: Home              Anticipated d/c date is: 3 days              Patient currently is not medically stable to d/c. Barriers: Not Clinically Stable-   Code Status : -  Code Status: Full Code   Family Communication:   NA (patient is alert, awake and coherent)   Consults  :  Gen surgery  DVT Prophylaxis  :   - SCDs  SCDs Start: 02/10/21 2309    Lab Results  Component Value Date   PLT 185 02/11/2021    Inpatient Medications  Scheduled Meds: . amLODipine  2.5 mg Oral Daily  . folic acid  1 mg Oral Daily  . multivitamin with minerals  1 tablet Oral Daily  . thiamine  100 mg Oral Daily   Or  . thiamine  100 mg Intravenous Daily   Continuous Infusions: . dextrose 5 % and 0.9% NaCl 75 mL/hr at 02/10/21 2355   PRN Meds:.acetaminophen **OR** acetaminophen, LORazepam **OR** LORazepam, morphine injection, ondansetron **OR** ondansetron (ZOFRAN) IV    Anti-infectives (From admission, onward)   None  Objective:   Vitals:   02/10/21 2238 02/10/21 2309 02/11/21 0609 02/11/21 1357  BP: (!) 153/81  (!) 155/89 (!) 168/92  Pulse: 66  (!) 54 71  Resp: 18  18 18   Temp: 97.8 F (36.6 C)  97.9 F (36.6 C) 98.1 F (36.7 C)  TempSrc: Oral  Oral Oral  SpO2: 100% 99% 95% 100%  Weight: 62.7 kg     Height: 5\' 7"  (1.702 m)       Wt Readings from Last 3 Encounters:  02/10/21 62.7 kg  01/09/21 61.2 kg  09/24/19 64.4 kg     Intake/Output Summary (Last 24 hours) at 02/11/2021 1613 Last data filed at 02/11/2021 1359 Gross per 24 hour  Intake 1218.57 ml  Output 550 ml  Net 668.57 ml     Physical Exam  Gen:- Awake Alert,  In no apparent distress HEENT:- [place].AT, No sclera icterus Neck-Supple Neck,No JVD,.  Lungs-   CTAB , fair symmetrical air movement CV- S1, S2 normal, regular  Abd-  +ve B.Sounds, Abd Soft, no abdominal tenderness without rebound or guarding, not distended Extremity/Skin:- No  edema, pedal pulses present  Psych-affect is appropriate, oriented x3 Neuro-no new focal deficits, no tremors   Data Review:   Micro Results Recent Results (from the past 240 hour(s))  SARS CORONAVIRUS 2 (TAT 6-24 HRS) Nasopharyngeal Nasopharyngeal Swab     Status: None   Collection Time: 02/10/21  9:24 PM   Specimen: Nasopharyngeal Swab  Result Value Ref Range Status   SARS Coronavirus 2 NEGATIVE NEGATIVE Final    Comment: (NOTE) SARS-CoV-2 target nucleic acids are NOT DETECTED.  The SARS-CoV-2 RNA is generally detectable in upper and lower respiratory specimens during the acute phase of infection. Negative results do not preclude SARS-CoV-2 infection, do not rule out co-infections with other pathogens, and should not be used as the sole basis for treatment or other patient management decisions. Negative results must be combined with clinical observations, patient history, and epidemiological information. The expected result is Negative.  Fact Sheet for Patients: SugarRoll.be  Fact Sheet for Healthcare Providers: https://www.woods-mathews.com/  This test is not yet approved or cleared by the [place] FDA and  has been authorized for detection and/or diagnosis of SARS-CoV-2 by FDA under an Emergency Use Authorization (EUA). This EUA will remain  in effect (meaning this test can be used) for the duration of the COVID-19 declaration under Se ction 564(b)(1) of the Act, 21 U.S.C. section 360bbb-3(b)(1), unless the authorization is terminated or revoked sooner.  Performed at [facility], [place] [address] [place], [place] [address]     Radiology Reports CT ABDOMEN PELVIS WO CONTRAST  Result Date: 02/10/2021 CLINICAL DATA:  Abdominal pain,  constipation EXAM: CT ABDOMEN AND PELVIS WITHOUT CONTRAST TECHNIQUE: Multidetector CT imaging of the abdomen and pelvis was performed following the standard protocol without IV contrast. COMPARISON:  11/06/2020 FINDINGS: Lower chest: Lung bases are essentially clear. Hepatobiliary: Unenhanced liver is unremarkable. Gallbladder is unremarkable. No intrahepatic or extrahepatic ductal dilatation. Pancreas: Within normal limits. Spleen: Within normal limits. Adrenals/Urinary Tract: 19 mm left adrenal adenoma (series 2/image 19). Right adrenal gland is within normal limits. Two right renal cysts measuring up to 4.0 cm in the right lower pole (series 2/image 30). Left kidney is within normal limits. No renal calculi or hydronephrosis. Mildly thick-walled bladder, although underdistended. Stomach/Bowel: Stomach is within normal limits. No evidence of small bowel obstruction. Dilated sigmoid colon with narrowing in the central mesenteric root (series 2/image 44). This  is recurrent but less severe when compared to the prior. Sigmoid colon measures up to 6.2 cm on the current study, without wall thickening or mesenteric edema. Vascular/Lymphatic: No evidence of abdominal aortic aneurysm. Atherosclerotic calcifications of the abdominal aorta and branch vessels. No suspicious abdominopelvic lymphadenopathy. Reproductive: Prostatomegaly, suggesting BPH. Other: No abdominopelvic ascites. Musculoskeletal: Degenerative changes of the visualized thoracolumbar spine. 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 This appearance is recurrent but improved when compared to the prior. Additional ancillary findings as above. Electronically Signed   By: Julian Hy M.D.   On: 02/10/2021 19:39     CBC Recent Labs  Lab 02/10/21 1723 02/11/21 0812  WBC 7.1 5.6  HGB 16.2 14.3  HCT 49.2 42.3  PLT 192 185  MCV 95.9 94.0  MCH 31.6 31.8  MCHC 32.9 33.8  RDW 13.6 13.3  LYMPHSABS 2.5  --   MONOABS  0.5  --   EOSABS 0.1  --   BASOSABS 0.0  --     Chemistries  Recent Labs  Lab 02/10/21 1723 02/10/21 2212 02/11/21 0812  NA 134*  --  137  K 4.3  --  3.9  CL 101  --  109  CO2 25  --  22  GLUCOSE 104*  --  105*  BUN 13  --  10  CREATININE 1.38*  --  1.00  CALCIUM 9.4  --  8.7*  MG  --  2.2  --   AST 22  --   --   ALT 32  --   --   ALKPHOS 89  --   --   BILITOT 0.5  --   --    ------------------------------------------------------------------------------------------------------------------ No results for input(s): CHOL, HDL, LDLCALC, TRIG, CHOLHDL, LDLDIRECT in the last 72 hours.  No results found for: HGBA1C ------------------------------------------------------------------------------------------------------------------ No results for input(s): TSH, T4TOTAL, T3FREE, THYROIDAB in the last 72 hours.  Invalid input(s): FREET3 ------------------------------------------------------------------------------------------------------------------ No results for input(s): VITAMINB12, FOLATE, FERRITIN, TIBC, IRON, RETICCTPCT in the last 72 hours.  Coagulation profile No results for input(s): INR, PROTIME in the last 168 hours.  No results for input(s): DDIMER in the last 72 hours.  Cardiac Enzymes No results for input(s): CKMB, TROPONINI, MYOGLOBIN in the last 168 hours.  Invalid input(s): CK ------------------------------------------------------------------------------------------------------------------ No results found for: BNP   Roxan Hockey M.D on 02/11/2021 at 4:13 PM  Go to [email or web address] - for contact info  [facility] - Office  [phone]

## 2021-02-11 NOTE — Consult Note (Addendum)
Reason for Consult: Sigmoid volvulus Referring Physician: Dr. Rayburn Marc Jimenez is an 70 y.o. male.  HPI: Patient is a 70 year old black male with a history of sigmoid volvulus, having been admitted to [facility] in March of this year who presents with recurrent nausea and abdominal pain.  He initially was diagnosed with a sigmoid volvulus which was reduced with the colonoscope.  The patient ultimately refused a partial colectomy and went home.  His history is somewhat limited as he states he does drink a lot of beers, last drinking approximately 4 to 5 days ago.  He does not know when his last bowel movement was.  He was seen in the emergency room and was found on CT scan of the abdomen to have a recurrent sigmoid colon volvulus, though not as severe as in March.  He states he is not having significant abdominal pain.  He denies any nausea or vomiting at the present time.  Past Medical History:  Diagnosis Date  . Alcohol abuse   . Arrhythmia   . Cataplexy   . Glaucoma   . Heart disease   . Hypertension   . Narcolepsy   . Narcolepsy     Past Surgical History:  Procedure Laterality Date  . HERNIA REPAIR      History reviewed. No pertinent family history.  Social History:  reports that he has been smoking. He has a 30.00 pack-year smoking history. He has never used smokeless tobacco. He reports current alcohol use of about 21.0 standard drinks of alcohol per week. He reports current drug use. Drug: Marijuana.  Allergies: No Known Allergies  Medications: I have reviewed the patient's current medications.  Results for orders placed or performed during the hospital encounter of 02/10/21 (from the past 48 hour(s))  Comprehensive metabolic panel     Status: Abnormal   Collection Time: 02/10/21  5:23 PM  Result Value Ref Range   Sodium 134 (L) 135 - 145 mmol/L   Potassium 4.3 3.5 - 5.1 mmol/L   Chloride 101 98 - 111 mmol/L   CO2 25 22 - 32 mmol/L   Glucose, Bld 104  (H) 70 - 99 mg/dL    Comment: Glucose reference range applies only to samples taken after fasting for at least 8 hours.   BUN 13 8 - 23 mg/dL   Creatinine, Ser 1.38 (H) 0.61 - 1.24 mg/dL   Calcium 9.4 8.9 - 10.3 mg/dL   Total Protein 8.2 (H) 6.5 - 8.1 g/dL   Albumin 4.8 3.5 - 5.0 g/dL   AST 22 15 - 41 U/L   ALT 32 0 - 44 U/L   Alkaline Phosphatase 89 38 - 126 U/L   Total Bilirubin 0.5 0.3 - 1.2 mg/dL   GFR, Estimated 55 (L) >60 mL/min    Comment: (NOTE) Calculated using the CKD-EPI Creatinine Equation (2021)    Anion gap 8 5 - 15    Comment: Performed at [facility], [address], [place], [place] [address]  Lipase, blood     Status: None   Collection Time: 02/10/21  5:23 PM  Result Value Ref Range   Lipase 38 11 - 51 U/L    Comment: Performed at [facility], [address], [place], [place] [address]  CBC WITH DIFFERENTIAL     Status: None   Collection Time: 02/10/21  5:23 PM  Result Value Ref Range   WBC 7.1 4.0 - 10.5 K/uL   RBC 5.13 4.22 - 5.81 MIL/uL  Hemoglobin 16.2 13.0 - 17.0 g/dL   HCT 49.2 39.0 - 52.0 %   MCV 95.9 80.0 - 100.0 fL   MCH 31.6 26.0 - 34.0 pg   MCHC 32.9 30.0 - 36.0 g/dL   RDW 13.6 11.5 - 15.5 %   Platelets 192 150 - 400 K/uL   nRBC 0.0 0.0 - 0.2 %   Neutrophils Relative % 55 %   Neutro Abs 3.9 1.7 - 7.7 K/uL   Lymphocytes Relative 35 %   Lymphs Abs 2.5 0.7 - 4.0 K/uL   Monocytes Relative 8 %   Monocytes Absolute 0.5 0.1 - 1.0 K/uL   Eosinophils Relative 2 %   Eosinophils Absolute 0.1 0.0 - 0.5 K/uL   Basophils Relative 0 %   Basophils Absolute 0.0 0.0 - 0.1 K/uL   Immature Granulocytes 0 %   Abs Immature Granulocytes 0.02 0.00 - 0.07 K/uL    Comment: Performed at [facility], [address], [place], [place] [address]  Urinalysis, Routine w reflex microscopic Urine, Clean Catch     Status: Abnormal   Collection Time: 02/10/21  9:24 PM  Result Value Ref Range   Color, Urine STRAW (A) YELLOW   APPearance CLEAR CLEAR   Specific Gravity,  Urine 1.005 1.005 - 1.030   pH 5.0 5.0 - 8.0   Glucose, UA NEGATIVE NEGATIVE mg/dL   Hgb urine dipstick NEGATIVE NEGATIVE   Bilirubin Urine NEGATIVE NEGATIVE   Ketones, ur NEGATIVE NEGATIVE mg/dL   Protein, ur NEGATIVE NEGATIVE mg/dL   Nitrite NEGATIVE NEGATIVE   Leukocytes,Ua NEGATIVE NEGATIVE    Comment: Performed at [facility], [address], [place], [place] [address]  Ethanol     Status: None   Collection Time: 02/10/21 10:12 PM  Result Value Ref Range   Alcohol, Ethyl (B) <10 <10 mg/dL    Comment: (NOTE) Lowest detectable limit for serum alcohol is 10 mg/dL.  For medical purposes only. Performed at [facility], [address] [place], [place] [address]   Magnesium     Status: None   Collection Time: 02/10/21 10:12 PM  Result Value Ref Range   Magnesium 2.2 1.7 - 2.4 mg/dL    Comment: Performed at [facility], [address] [place], [place] [address]  Phosphorus     Status: None   Collection Time: 02/10/21 10:12 PM  Result Value Ref Range   Phosphorus 3.8 2.5 - 4.6 mg/dL    Comment: Performed at [facility], [address] [place], [place] [address]  Basic metabolic panel     Status: Abnormal   Collection Time: 02/11/21  8:12 AM  Result Value Ref Range   Sodium 137 135 - 145 mmol/L   Potassium 3.9 3.5 - 5.1 mmol/L   Chloride 109 98 - 111 mmol/L   CO2 22 22 - 32 mmol/L   Glucose, Bld 105 (H) 70 - 99 mg/dL    Comment: Glucose reference range applies only to samples taken after fasting for at least 8 hours.   BUN 10 8 - 23 mg/dL   Creatinine, Ser 1.00 0.61 - 1.24 mg/dL   Calcium 8.7 (L) 8.9 - 10.3 mg/dL   GFR, Estimated >60 >60 mL/min    Comment: (NOTE) Calculated using the CKD-EPI Creatinine Equation (2021)    Anion gap 6 5 - 15    Comment: Performed at [facility], [address] [place], [place] [address]  CBC     Status: None   Collection Time: 02/11/21  8:12 AM  Result Value Ref Range   WBC 5.6 4.0 - 10.5 K/uL   RBC 4.50 4.22 - 5.81 MIL/uL    Hemoglobin 14.3 13.0 - 17.0 g/dL   HCT 42.3 39.0 - 52.0 %   MCV 94.0 80.0 - 100.0 fL   MCH 31.8 26.0 - 34.0 pg   MCHC 33.8 30.0 - 36.0 g/dL   RDW 13.3 11.5 - 15.5 %   Platelets 185 150 - 400 K/uL   nRBC 0.0 0.0 - 0.2 %    Comment: Performed at [facility], [address] [place], [place] [address]    CT ABDOMEN PELVIS WO CONTRAST  Result Date: 02/10/2021 CLINICAL DATA:  Abdominal pain, constipation EXAM: CT ABDOMEN AND PELVIS WITHOUT CONTRAST TECHNIQUE: Multidetector CT imaging of the abdomen and pelvis was performed following the standard protocol without IV contrast. COMPARISON:  11/06/2020 FINDINGS: Lower chest: Lung bases are essentially clear. Hepatobiliary: Unenhanced liver is unremarkable. Gallbladder is unremarkable. No intrahepatic or extrahepatic ductal dilatation. Pancreas: Within normal limits. Spleen: Within normal limits. Adrenals/Urinary Tract: 19 mm left adrenal adenoma (series 2/image 19). Right adrenal gland is within normal limits. Two right renal cysts measuring up to 4.0 cm in the right lower pole (series 2/image 30). Left kidney is within normal limits. No renal calculi or hydronephrosis. Mildly thick-walled bladder, although underdistended. Stomach/Bowel: Stomach is within normal limits. No evidence of small bowel obstruction. Dilated sigmoid colon with narrowing in the central mesenteric root (series 2/image 44). This is recurrent but less severe when compared to the prior. Sigmoid colon measures up to 6.2 cm on the current study, without wall thickening or mesenteric edema. Vascular/Lymphatic: No evidence of abdominal aortic aneurysm. Atherosclerotic calcifications of the abdominal aorta and branch vessels. No suspicious abdominopelvic lymphadenopathy. Reproductive: Prostatomegaly, suggesting BPH. Other: No abdominopelvic ascites. Musculoskeletal: Degenerative changes of the visualized thoracolumbar spine. IMPRESSION: Suspected mild recurrent sigmoid volvulus, as described  above. This appearance is recurrent but improved when compared to the prior. Additional ancillary findings as above. Electronically Signed   By: Julian Hy M.D.   On: 02/10/2021 19:39    ROS:  Review of systems not obtained due to patient factors.  Blood pressure (!) 155/89, pulse (!) 54, temperature 97.9 F (36.6 C), temperature source Oral, resp. rate 18, height 5\' 7"  (1.702 m), weight 62.7 kg, SpO2 95 %. Physical Exam: Pleasant black male in no acute distress Head is normocephalic, atraumatic Lungs are clear to auscultation with equal breath sounds bilaterally Heart examination reveals regular rate and rhythm without S3, S4, murmurs Abdomen is mildly distended but without rigidity.  Occasional bowel sounds are present.  CT scan images personally reviewed  Assessment/Plan: Recurrent sigmoid volvulus, EtOH abuse  Plan: Patient is amenable to possible surgical intervention during this admission.  Will start slow bowel prep.  I do not feel a colonoscopy for decompression is indicated at this time, though may need one prior to surgery if patient does not tolerate bowel prep.  Discussed with Dr. Denton Brick.  We will see how he does over the next 24 hours.  Aviva Signs 02/11/2021, 9:51 AM

## 2021-02-11 NOTE — Progress Notes (Signed)
Patient up to bathroom and states feels like he has to have a large BM but only able to get out a little. Only clear mucus noted in toilet.

## 2021-02-12 ENCOUNTER — Encounter (HOSPITAL_COMMUNITY): Payer: Self-pay | Admitting: Internal Medicine

## 2021-02-12 ENCOUNTER — Encounter (HOSPITAL_COMMUNITY)
Admission: EM | Disposition: A | Discharge status: Skilled Nursing Facility | Payer: Self-pay | Source: Home / Self Care | Attending: Family Medicine

## 2021-02-12 ENCOUNTER — Inpatient Hospital Stay (HOSPITAL_COMMUNITY): Payer: Medicare HMO | Admitting: Anesthesiology

## 2021-02-12 ENCOUNTER — Inpatient Hospital Stay (HOSPITAL_COMMUNITY): Payer: Medicare HMO

## 2021-02-12 DIAGNOSIS — K562 Volvulus: Principal | ICD-10-CM

## 2021-02-12 HISTORY — PX: FLEXIBLE SIGMOIDOSCOPY: SHX5431

## 2021-02-12 SURGERY — SIGMOIDOSCOPY, FLEXIBLE
Anesthesia: General

## 2021-02-12 MED ORDER — LACTATED RINGERS IV SOLN: INTRAVENOUS | Status: DC

## 2021-02-12 MED ORDER — FENTANYL CITRATE (PF) 100 MCG/2ML IJ SOLN
INTRAMUSCULAR | Status: DC | PRN
  Administered 2021-02-12 (×2): 50 ug via INTRAVENOUS

## 2021-02-12 MED ORDER — PROPOFOL 10 MG/ML IV BOLUS
INTRAVENOUS | Status: DC | PRN
  Administered 2021-02-12: 30 mg via INTRAVENOUS
  Administered 2021-02-12: 130 mg via INTRAVENOUS

## 2021-02-12 MED ORDER — SUCCINYLCHOLINE CHLORIDE 200 MG/10ML IV SOSY
PREFILLED_SYRINGE | INTRAVENOUS | Status: AC
  Filled 2021-02-12: qty 10

## 2021-02-12 MED ORDER — EPHEDRINE 5 MG/ML INJ
INTRAVENOUS | Status: AC
  Filled 2021-02-12: qty 10

## 2021-02-12 MED ORDER — ONDANSETRON HCL 4 MG/2ML IJ SOLN
INTRAMUSCULAR | Status: DC | PRN
  Administered 2021-02-12: 4 mg via INTRAVENOUS

## 2021-02-12 MED ORDER — LIDOCAINE 2% (20 MG/ML) 5 ML SYRINGE
INTRAMUSCULAR | Status: DC | PRN
  Administered 2021-02-12: 50 mg via INTRAVENOUS

## 2021-02-12 MED ORDER — SUCCINYLCHOLINE CHLORIDE 200 MG/10ML IV SOSY
PREFILLED_SYRINGE | INTRAVENOUS | Status: DC | PRN
  Administered 2021-02-12: 100 mg via INTRAVENOUS

## 2021-02-12 MED ORDER — LIDOCAINE HCL (PF) 2 % IJ SOLN
INTRAMUSCULAR | Status: AC
  Filled 2021-02-12: qty 5

## 2021-02-12 MED ORDER — SODIUM CHLORIDE 0.9 % IV SOLN: INTRAVENOUS | Status: DC

## 2021-02-12 MED ORDER — PHENYLEPHRINE 40 MCG/ML (10ML) SYRINGE FOR IV PUSH (FOR BLOOD PRESSURE SUPPORT)
PREFILLED_SYRINGE | INTRAVENOUS | Status: AC
  Filled 2021-02-12: qty 10

## 2021-02-12 MED ORDER — FENTANYL CITRATE (PF) 100 MCG/2ML IJ SOLN: 25.0000 ug | INTRAMUSCULAR | Status: DC | PRN

## 2021-02-12 MED ORDER — GLYCOPYRROLATE 0.2 MG/ML IJ SOLN
INTRAMUSCULAR | Status: DC | PRN
  Administered 2021-02-12: .2 mg via INTRAVENOUS

## 2021-02-12 MED ORDER — ONDANSETRON HCL 4 MG/2ML IJ SOLN
INTRAMUSCULAR | Status: AC
  Filled 2021-02-12: qty 2

## 2021-02-12 MED ORDER — FENTANYL CITRATE (PF) 100 MCG/2ML IJ SOLN
INTRAMUSCULAR | Status: AC
  Filled 2021-02-12: qty 2

## 2021-02-12 MED ORDER — EPHEDRINE SULFATE-NACL 50-0.9 MG/10ML-% IV SOSY
PREFILLED_SYRINGE | INTRAVENOUS | Status: DC | PRN
  Administered 2021-02-12: 5 mg via INTRAVENOUS

## 2021-02-12 MED ORDER — BISACODYL 10 MG RE SUPP
10.0000 mg | Freq: Once | RECTAL | Status: AC
  Administered 2021-02-12: 10 mg via RECTAL
  Filled 2021-02-12: qty 1

## 2021-02-12 MED ORDER — PHENYLEPHRINE 40 MCG/ML (10ML) SYRINGE FOR IV PUSH (FOR BLOOD PRESSURE SUPPORT)
PREFILLED_SYRINGE | INTRAVENOUS | Status: DC | PRN
  Administered 2021-02-12: 80 ug via INTRAVENOUS

## 2021-02-12 MED ORDER — GLYCOPYRROLATE PF 0.2 MG/ML IJ SOSY
PREFILLED_SYRINGE | INTRAMUSCULAR | Status: AC
  Filled 2021-02-12: qty 1

## 2021-02-12 MED ORDER — ONDANSETRON HCL 4 MG/2ML IJ SOLN: 4.0000 mg | Freq: Once | INTRAMUSCULAR | Status: DC | PRN

## 2021-02-12 MED ORDER — PROPOFOL 10 MG/ML IV BOLUS
INTRAVENOUS | Status: AC
  Filled 2021-02-12: qty 20

## 2021-02-12 NOTE — Progress Notes (Signed)
  Subjective: Patient denies any abdominal pain.  Has not had a bowel movement or passed flatus.  No nausea or vomiting noted.  Objective: Vital signs in last 24 hours: Temp:  [98 F (36.7 C)-98.8 F (37.1 C)] 98.3 F (36.8 C) (06/08 0603) Pulse Rate:  [50-71] 70 (06/08 0603) Resp:  [18-20] 20 (06/08 0603) BP: (148-168)/(78-99) 150/98 (06/08 0603) SpO2:  [99 %-100 %] 99 % (06/08 0013) Last BM Date:  (unknown)  Intake/Output from previous day: 06/07 0701 - 06/08 0700 In: 240 [P.O.:240] Out: 2050 [Urine:2050] Intake/Output this shift: No intake/output data recorded.  General appearance: alert, cooperative and no distress GI: Soft, mildly distended.  Minimal bowel sounds appreciated.  No rigidity noted.  Nontender.  Lab Results:  Recent Labs    02/10/21 1723 02/11/21 0812  WBC 7.1 5.6  HGB 16.2 14.3  HCT 49.2 42.3  PLT 192 185   BMET Recent Labs    02/10/21 1723 02/11/21 0812  NA 134* 137  K 4.3 3.9  CL 101 109  CO2 25 22  GLUCOSE 104* 105*  BUN 13 10  CREATININE 1.38* 1.00  CALCIUM 9.4 8.7*   PT/INR No results for input(s): LABPROT, INR in the last 72 hours.  Studies/Results: CT ABDOMEN PELVIS WO CONTRAST  Result Date: 02/10/2021 CLINICAL DATA:  Abdominal pain, constipation EXAM: CT ABDOMEN AND PELVIS WITHOUT CONTRAST TECHNIQUE: Multidetector CT imaging of the abdomen and pelvis was performed following the standard protocol without IV contrast. COMPARISON:  11/06/2020 FINDINGS: Lower chest: Lung bases are essentially clear. Hepatobiliary: Unenhanced liver is unremarkable. Gallbladder is unremarkable. No intrahepatic or extrahepatic ductal dilatation. Pancreas: Within normal limits. Spleen: Within normal limits. Adrenals/Urinary Tract: 19 mm left adrenal adenoma (series 2/image 19). Right adrenal gland is within normal limits. Two right renal cysts measuring up to 4.0 cm in the right lower pole (series 2/image 30). Left kidney is within normal limits. No renal  calculi or hydronephrosis. Mildly thick-walled bladder, although underdistended. Stomach/Bowel: Stomach is within normal limits. No evidence of small bowel obstruction. Dilated sigmoid colon with narrowing in the central mesenteric root (series 2/image 44). This is recurrent but less severe when compared to the prior. Sigmoid colon measures up to 6.2 cm on the current study, without wall thickening or mesenteric edema. Vascular/Lymphatic: No evidence of abdominal aortic aneurysm. Atherosclerotic calcifications of the abdominal aorta and branch vessels. No suspicious abdominopelvic lymphadenopathy. Reproductive: Prostatomegaly, suggesting BPH. Other: No abdominopelvic ascites. Musculoskeletal: Degenerative changes of the visualized thoracolumbar spine. 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 This appearance is recurrent but improved when compared to the prior. Additional ancillary findings as above. Electronically Signed   By: Julian Hy M.D.   On: 02/10/2021 19:39    Anti-infectives: Anti-infectives (From admission, onward)   None      Assessment/Plan: Impression: Partial sigmoid volvulus.  Patient has not had a bowel movement or passed flatus despite drinking GoLytely.  We will get GI to perform a decompressive colonoscopy so that I can give him a adequate bowel prep for a partial colectomy.  Patient understands this and agrees.  LOS: 2 days    Aviva Signs 02/12/2021

## 2021-02-12 NOTE — Op Note (Signed)
[facility] Patient Name: Marc Jimenez Procedure Date: 02/12/2021 11:38 AM MRN: [id number] Date of Birth: 07-23-1951 Attending MD: Maylon Peppers ,  CSN: [id number] Age: 70 Admit Type: Inpatient Procedure:                Flexible Sigmoidoscopy Indications:              Volvulus Providers:                Maylon Peppers, Janeece Riggers, RN, Nelma Rothman,                            Technician Referring MD:              Medicines:                General Anesthesia Complications:            No immediate complications. Estimated Blood Loss:     Estimated blood loss: none. Procedure:                Pre-Anesthesia Assessment:                           - Prior to the procedure, a History and Physical                            was performed, and patient medications, allergies                            and sensitivities were reviewed. The patient's                            tolerance of previous anesthesia was reviewed.                           - The risks and benefits of the procedure and the                            sedation options and risks were discussed with the                            patient. All questions were answered and informed                            consent was obtained.                           - ASA Grade Assessment: III - A patient with severe                            systemic disease.                           After obtaining informed consent, the scope was                            passed under direct vision. The [id number]-HQ190L                            (  [id number]) scope was introduced through the anus and                            advanced to the the descending colon. The flexible                            sigmoidoscopy was accomplished without difficulty.                            The patient tolerated the procedure well. Scope In: 12:09:02 PM Scope Out: 12:20:36 PM Total Procedure Duration: 0 hours 11 minutes 34 seconds  Findings:      The perianal  and digital rectal examinations were normal.      A volvulus with viable appearing mucosa was found in the sigmoid colon.       Large amount of liquid stool was found proximal to the area of the       volvulus along with significant colonic dilation. I proceeded with       decompressing the lumen by suctioning the gas and intraluminal stool.       This was followed by repeat maneuvering with loop reduction. Impression:               - Volvulus.                           - No specimens collected. Moderate Sedation:      Per Anesthesia Care Recommendation:           - Return patient to hospital ward for ongoing care.                           - Diet per general surgery recs.                           - Perform KUB today.                           - Proceed with plan for partial colectomy by                            general surgery. Procedure Code(s):        --- Professional ---                           [phone], Sigmoidoscopy, flexible; diagnostic,                            including collection of specimen(s) by brushing or                            washing, when performed (separate procedure) Diagnosis Code(s):        --- Professional ---                           X32.4, Volvulus CPT copyright 2019 American Medical Association. All rights reserved. The codes documented in this report are preliminary and upon coder review may  be revised to meet  current compliance requirements. Maylon Peppers, MD Maylon Peppers,  02/12/2021 12:32:02 PM This report has been signed electronically. Number of Addenda: 0

## 2021-02-12 NOTE — Anesthesia Preprocedure Evaluation (Addendum)
Anesthesia Evaluation  Patient identified by MRN, date of birth, ID band Patient awake    Reviewed: Allergy & Precautions, NPO status , Patient's Chart, lab work & pertinent test results  Airway Mallampati: III  TM Distance: >3 FB Neck ROM: Full    Dental  (+) Edentulous Upper, Edentulous Lower   Pulmonary Current Smoker and Patient abstained from smoking.,    Pulmonary exam normal breath sounds clear to auscultation       Cardiovascular Exercise Tolerance: Poor hypertension, Pt. on medications + dysrhythmias Atrial Fibrillation  Rhythm:Irregular Rate:Abnormal - Systolic murmurs, - Diastolic murmurs, - Friction Rub, - Carotid Bruit, - Peripheral Edema and - Systolic Click [id number] 83:41:96 [place] System-AP-300 ROUTINE RECORD 1950/09/24 (61 yr) Male Black Room:A326 Loc:903 Technician: Test ind: Vent. rate 56 BPM PR interval * ms QRS duration 150 ms QT/QTcB 434/418 ms P-R-T axes -82 -54 77 Atrial flutter with variable A-V block Left axis deviation Right bundle branch block Septal infarct , age undetermined T wave abnormality, consider lateral ischemia Abnormal ECG   Neuro/Psych PSYCHIATRIC DISORDERS    GI/Hepatic Bowel prep,(+)     substance abuse  alcohol use and marijuana use, Sigmoid volvulus    Endo/Other  negative endocrine ROS  Renal/GU Renal InsufficiencyRenal disease (AKI)     Musculoskeletal negative musculoskeletal ROS (+)   Abdominal   Peds  Hematology negative hematology ROS (+)   Anesthesia Other Findings   Reproductive/Obstetrics negative OB ROS                            Anesthesia Physical Anesthesia Plan  ASA: IV and emergent  Anesthesia Plan: General   Post-op Pain Management:    Induction: Intravenous and Rapid sequence  PONV Risk Score and Plan: 2 and Ondansetron  Airway Management Planned: Oral ETT  Additional Equipment:    Intra-op Plan:   Post-operative Plan: Extubation in OR  Informed Consent: I have reviewed the patients History and Physical, chart, labs and discussed the procedure including the risks, benefits and alternatives for the proposed anesthesia with the patient or authorized representative who has indicated his/her understanding and acceptance.     Dental advisory given  Plan Discussed with: CRNA and Surgeon  Anesthesia Plan Comments:        Anesthesia Quick Evaluation

## 2021-02-12 NOTE — Transfer of Care (Signed)
Immediate Anesthesia Transfer of Care Note  Patient: Marc Jimenez  Procedure(s) Performed: FLEXIBLE SIGMOIDOSCOPY (N/A )  Patient Location: PACU  Anesthesia Type:General  Level of Consciousness: drowsy  Airway & Oxygen Therapy: Patient Spontanous Breathing and Patient connected to face mask oxygen  Post-op Assessment: Report given to RN and Post -op Vital signs reviewed and stable  Post vital signs: Reviewed and stable  Last Vitals:  Vitals Value Taken Time  BP    Temp    Pulse 74 02/12/21 1232  Resp 15 02/12/21 1232  SpO2 99 % 02/12/21 1232  Vitals shown include unvalidated device data.  Last Pain:  Vitals:   02/12/21 1139  TempSrc: Oral  PainSc:       Patients Stated Pain Goal: 5 (44/58/48 3507)  Complications: No complications documented.

## 2021-02-12 NOTE — Progress Notes (Signed)
We will proceed with flex sigmoidoscopy as scheduled.  I thoroughly discussed with the patient his procedure, including the risks involved. Patient understands what the procedure involves including the benefits and any risks. Patient understands alternatives to the proposed procedure. Risks including (but not limited to) bleeding, tearing of the lining (perforation), rupture of adjacent organs, problems with heart and lung function, infection, and medication reactions. A small percentage of complications may require surgery, hospitalization, repeat endoscopic procedure, and/or transfusion.  Patient understood and agreed.  Marc Peppers, MD Gastroenterology and Hepatology [facility]

## 2021-02-12 NOTE — Anesthesia Postprocedure Evaluation (Signed)
Anesthesia Post Note  Patient: Marc Jimenez  Procedure(s) Performed: FLEXIBLE SIGMOIDOSCOPY (N/A )  Patient location during evaluation: PACU Anesthesia Type: General Level of consciousness: awake and alert, oriented and sedated Pain management: pain level controlled Vital Signs Assessment: post-procedure vital signs reviewed and stable Respiratory status: spontaneous breathing and respiratory function stable Cardiovascular status: blood pressure returned to baseline and stable Postop Assessment: no apparent nausea or vomiting Anesthetic complications: no   No complications documented.   Last Vitals:  Vitals:   02/12/21 1315 02/12/21 1333  BP: (!) 150/90 (!) 152/96  Pulse: 76 71  Resp: 15 18  Temp:  36.7 C  SpO2: 94% 97%    Last Pain:  Vitals:   02/12/21 1333  TempSrc: Oral  PainSc:                  Maddax Palinkas C Kiylee Thoreson

## 2021-02-12 NOTE — Anesthesia Procedure Notes (Signed)
Procedure Name: Intubation Date/Time: 02/12/2021 12:03 PM Performed by: Orlie Dakin, CRNA Pre-anesthesia Checklist: Patient identified, Emergency Drugs available, Suction available and Patient being monitored Patient Re-evaluated:Patient Re-evaluated prior to induction Oxygen Delivery Method: Circle system utilized Preoxygenation: Pre-oxygenation with 100% oxygen Induction Type: IV induction, Rapid sequence and Cricoid Pressure applied Laryngoscope Size: Glidescope and 3 Grade View: Grade I Tube type: Oral Tube size: 7.5 mm Number of attempts: 1 Airway Equipment and Method: Stylet and Video-laryngoscopy Placement Confirmation: ETT inserted through vocal cords under direct vision,  positive ETCO2 and breath sounds checked- equal and bilateral Secured at: 23 cm Tube secured with: Tape Dental Injury: Teeth and Oropharynx as per pre-operative assessment

## 2021-02-12 NOTE — Brief Op Note (Signed)
02/10/2021 - 02/12/2021  12:29 PM  PATIENT:  Marc Jimenez  70 y.o. male  PRE-OPERATIVE DIAGNOSIS:  Sigmoid volvulus  POST-OPERATIVE DIAGNOSIS:  * No post-op diagnosis entered *  PROCEDURE:  Procedure(s): FLEXIBLE SIGMOIDOSCOPY (N/A)  SURGEON:  Surgeon(s) and Role:    * Harvel Quale, MD - Primary  Patient underwent flexible sigmoidoscopy under general anesthesia today.  Tolerated procedure adequately.  A volvulus with viable appearing mucosa was found in the sigmoid colon. Large amount of liquid stool was found proximal to the area of the volvulus along with significant colonic dilation. I proceeded with decompressing the lumen by suctioning the gas and intraluminal stool. This was followed by repeat maneuvering with loop reduction.  RECOMMENDATIONS: - Return patient to hospital ward for ongoing care.  - Diet per general surgery recs. - Perform KUB today. - Proceed with plan for partial colectomy by general surgery. - GI service will sign-off, please call us back if you have any more questions.  Maylon Peppers, MD Gastroenterology and Hepatology [facility]

## 2021-02-12 NOTE — Consult Note (Signed)
@LOGO @   Referring Provider: Dr. Arnoldo Morale Primary Care Physician:  [name], MD Primary Gastroenterologist:  Dr. Jenetta Downer  Date of Admission: 02/10/21 Date of Consultation: 02/12/21  Reason for Consultation: Sigmoid volvulus  HPI:  Marc Jimenez is a 70 y.o. 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  Dr. Arnoldo Morale saw patient yesterday and did not feel patient needed a colonoscopy for decompression at that time.  Patient was agreeable to surgery.  Plan to start a slow bowel prep and monitor over the next 24 hours with plans to consult GI if he was unable to tolerate bowel prep.  We were consulted this morning as patient is not tolerating prep well and needs endoscopic decompression. He was made NPO at 6:30 am.   Today:  Feels well overall.  Reports 3-4 days ago he noticed decreased bowel movements and decreased appetite.  Reports his abdomen does seem a little bloated, but denies abdominal pain, nausea, or vomiting.  Reports he had been drinking his GoLytely prep slowly and this was not causing any problems for him.  Had a very small bowel movement yesterday and passing a tiny amount of gas.  He has not passed gas today and has not had a bowel movement today.  Denies BRBPR, melena, fever, chills, chest pain, heart palpitations, shortness of breath, cold or flulike symptoms.  Past Medical History:  Diagnosis Date  . Alcohol abuse   . Arrhythmia   . Cataplexy   . Glaucoma   . Heart disease   . Hypertension   . Narcolepsy   . Narcolepsy     Past Surgical History:  Procedure Laterality Date  . HERNIA  REPAIR      Prior to Admission medications   Medication Sig Start Date End Date Taking? Authorizing Provider  amLODipine (NORVASC) 5 MG tablet Take 1 tablet by mouth daily. 01/07/21  Yes [provider]  furosemide (LASIX) 20 MG tablet Take 10 mg by mouth daily. 01/07/21  Yes [provider]  sulfamethoxazole-trimethoprim (BACTRIM DS) 800-160 MG tablet Take 1 tablet by mouth 2 (two) times daily. 01/20/21  Yes Irine Seal, MD  tamsulosin (FLOMAX) 0.4 MG CAPS capsule Take 0.4 mg by mouth daily. 01/07/21  Yes [provider]  chlordiazePOXIDE (LIBRIUM) 25 MG capsule Take 2 capsules (50 mg total) by mouth 3 (three) times daily as needed for withdrawal. 50mg  by mouth q 6 h day 1 50mg  by mouth q 8 h day 2 50mg  by mouth q 12 h day 3 50mg  by mouth QHS day 4 Patient not taking: No sig reported 03/15/18   Little, Wenda Overland, MD  sulfamethoxazole-trimethoprim (BACTRIM DS) 800-160 MG tablet Take 1 tablet by mouth 2 (two) times daily. Patient not taking: No sig reported 01/15/21   Cleon Gustin, MD    Current Facility-Administered Medications  Medication Dose Route Frequency Provider Last Rate Last Admin  . acetaminophen (TYLENOL) tablet 650 mg  650 mg Oral Q6H PRN Emokpae, Ejiroghene E, MD       Or  . acetaminophen (TYLENOL) suppository 650 mg  650 mg Rectal Q6H PRN Emokpae, Ejiroghene E, MD      . amLODipine (NORVASC)  tablet 2.5 mg  2.5 mg Oral Daily Emokpae, Courage, MD   2.5 mg at 02/11/21 0933  . dextrose 5 %-0.9 % sodium chloride infusion   Intravenous Continuous Emokpae, Ejiroghene E, MD 75 mL/hr at 02/12/21 0341 New Bag at 02/12/21 0341  . folic acid (FOLVITE) tablet 1 mg  1 mg Oral Daily Emokpae, Ejiroghene E, MD   1 mg at 02/11/21 0933  . LORazepam (ATIVAN) tablet 1-4 mg  1-4 mg Oral Q1H PRN Emokpae, Ejiroghene E, MD       Or  . LORazepam (ATIVAN) injection 1-4 mg  1-4 mg Intravenous Q1H PRN Emokpae, Ejiroghene E, MD      . morphine 2 MG/ML injection 2 mg  2 mg  Intravenous Q4H PRN Emokpae, Ejiroghene E, MD   2 mg at 02/12/21 0000  . multivitamin with minerals tablet 1 tablet  1 tablet Oral Daily Emokpae, Ejiroghene E, MD   1 tablet at 02/11/21 0933  . ondansetron (ZOFRAN) tablet 4 mg  4 mg Oral Q6H PRN Emokpae, Ejiroghene E, MD       Or  . ondansetron (ZOFRAN) injection 4 mg  4 mg Intravenous Q6H PRN Emokpae, Ejiroghene E, MD      . thiamine tablet 100 mg  100 mg Oral Daily Emokpae, Ejiroghene E, MD   100 mg at 02/11/21 0933   Or  . thiamine (B-1) injection 100 mg  100 mg Intravenous Daily Emokpae, Ejiroghene E, MD        Allergies as of 02/10/2021  . (No Known Allergies)    History reviewed. No pertinent family history.  Social History   Socioeconomic History  . Marital status: Legally Separated    Spouse name: Not on file  . Number of children: Not on file  . Years of education: Not on file  . Highest education level: Not on file  Occupational History  . Not on file  Tobacco Use  . Smoking status: Current Every Day Smoker    Packs/day: 1.00    Years: 30.00    Pack years: 30.00  . Smokeless tobacco: Never Used  Vaping Use  . Vaping Use: Never used  Substance and Sexual Activity  . Alcohol use: Yes    Alcohol/week: 21.0 standard drinks    Types: 21 Cans of beer per week    Comment: 1 pint of liquor every day  . Drug use: Yes    Types: Marijuana  . Sexual activity: Not on file  Other Topics Concern  . Not on file  Social History Narrative  . Not on file   Social Determinants of Health   Financial Resource Strain: Not on file  Food Insecurity: Not on file  Transportation Needs: Not on file  Physical Activity: Not on file  Stress: Not on file  Social Connections: Not on file  Intimate Partner Violence: Not on file    Review of Systems: Gen: See HPI CV: See HPI Resp: See HPI GI: See HPI GU : Denies urinary burning, urinary frequency, urinary incontinence.  MS: Denies joint pain Derm: Denies rash Heme: Denies  bruising or bleeding.  Physical Exam: Vital signs in last 24 hours: Temp:  [98 F (36.7 C)-98.8 F (37.1 C)] 98.3 F (36.8 C) (06/08 0603) Pulse Rate:  [50-71] 70 (06/08 0603) Resp:  [18-20] 20 (06/08 0603) BP: (148-168)/(78-99) 150/98 (06/08 0603) SpO2:  [99 %-100 %] 99 % (06/08 0013) Last BM Date:  (unknown) General:   Alert,  Well-developed, well-nourished, pleasant and cooperative in  NAD Head:  Normocephalic and atraumatic. Eyes:  Sclera clear, no icterus.   Conjunctiva pink. Ears:  Normal auditory acuity. Lungs:  Clear throughout to auscultation.   No wheezes, crackles, or rhonchi. No acute distress. Heart:  Regular rate and rhythm; no murmurs, clicks, rubs,  or gallops. Abdomen: Mildly distended abdomen that remains soft and is nontender to palpation.  Tinkling bowel sounds noted in all 4 quadrants.  No masses, hepatosplenomegaly or hernias noted. Normal bowel sounds, without guarding, and without rebound.   Rectal:  Deferred  Msk:  Symmetrical without gross deformities. Normal posture.. Extremities:  Without edema. Neurologic:  Alert and  oriented x4;  grossly normal neurologically. Skin:  Intact without significant lesions or rashes. Psych:  Normal mood and affect.  Intake/Output from previous day: 06/07 0701 - 06/08 0700 In: 240 [P.O.:240] Out: 2050 [Urine:2050] Intake/Output this shift: No intake/output data recorded.  Lab Results: Recent Labs    02/10/21 1723 02/11/21 0812  WBC 7.1 5.6  HGB 16.2 14.3  HCT 49.2 42.3  PLT 192 185   BMET Recent Labs    02/10/21 1723 02/11/21 0812  NA 134* 137  K 4.3 3.9  CL 101 109  CO2 25 22  GLUCOSE 104* 105*  BUN 13 10  CREATININE 1.38* 1.00  CALCIUM 9.4 8.7*   LFT Recent Labs    02/10/21 1723  PROT 8.2*  ALBUMIN 4.8  AST 22  ALT 32  ALKPHOS 89  BILITOT 0.5   Studies/Results: CT ABDOMEN PELVIS WO CONTRAST  Result Date: 02/10/2021 CLINICAL DATA:  Abdominal pain, constipation EXAM: CT ABDOMEN AND PELVIS  WITHOUT CONTRAST TECHNIQUE: Multidetector CT imaging of the abdomen and pelvis was performed following the standard protocol without IV contrast. COMPARISON:  11/06/2020 FINDINGS: Lower chest: Lung bases are essentially clear. Hepatobiliary: Unenhanced liver is unremarkable. Gallbladder is unremarkable. No intrahepatic or extrahepatic ductal dilatation. Pancreas: Within normal limits. Spleen: Within normal limits. Adrenals/Urinary Tract: 19 mm left adrenal adenoma (series 2/image 19). Right adrenal gland is within normal limits. Two right renal cysts measuring up to 4.0 cm in the right lower pole (series 2/image 30). Left kidney is within normal limits. No renal calculi or hydronephrosis. Mildly thick-walled bladder, although underdistended. Stomach/Bowel: Stomach is within normal limits. No evidence of small bowel obstruction. Dilated sigmoid colon with narrowing in the central mesenteric root (series 2/image 44). This is recurrent but less severe when compared to the prior. Sigmoid colon measures up to 6.2 cm on the current study, without wall thickening or mesenteric edema. Vascular/Lymphatic: No evidence of abdominal aortic aneurysm. Atherosclerotic calcifications of the abdominal aorta and branch vessels. No suspicious abdominopelvic lymphadenopathy. Reproductive: Prostatomegaly, suggesting BPH. Other: No abdominopelvic ascites. Musculoskeletal: Degenerative changes of the visualized thoracolumbar spine. 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 This appearance is recurrent but improved when compared to the prior. Additional ancillary findings as above. Electronically Signed   By: Julian Hy M.D.   On: 02/10/2021 19:39    Impression: 70 y.o. 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 Surgery asking for decompression as patient only had a very small BM yesterday s/p sipping on Golytely prep. No passing gas today. Mild  abdominal distension, but denies abdominal pain, nausea, or vomiting. Abdomen is non-tender to palpation.   Patient needs a flexible sigmoidoscopy to decompress sigmoid volvulus in order to allow for good bowel prep and ultimately proceed with partial colectomy with Dr. Arnoldo Morale.  This was discussed with patient along with the need for intubation for flex sig due to obstruction.  Patient is agreeable. He has been n.p.o. since 6:30 AM.  Spoke with Dr. Arnoldo Morale, likely planning for surgery on Friday.   Plan: Keep NPO.   Proceed with flexible sigmoidoscopy with general anesthesia and intubation with Dr. Jenetta Downer today. The risks, benefits, and alternatives have been discussed with the patient in detail. The patient states understanding and desires to proceed.   Will give 2 tap water enemas for procedure today.      LOS: 2 days    02/12/2021, 7:46 AM   Aliene Altes, [facility]

## 2021-02-12 NOTE — Progress Notes (Signed)
Patient given CHG bath to prepare for surgery. Patients belongings (clothing, pull ups, and wallet) are placed under television on shelf per patient request.

## 2021-02-12 NOTE — Consult Note (Signed)
Cardiology Consultation:   Patient ID: MACAI SISNEROS MRN: [id number]; DOB: [birth year]  Admit date: 02/10/2021 Date of Consult: 02/12/2021  PCP:  [name], MD   [facility] Providers Cardiologist:  None        Patient Profile:   Marc Jimenez is a 70 y.o. male with a hx of hypertension as well as conduction disease and atrial flutter who is being seen 02/12/2021 for the evaluation of cardiac risk, preoperative at the request of general surgery  History of Present Illness:   [name] 70 year old man who is a very poor historian.  He apparently was brought to the hospital by his nephew because of abdominal distention and "inability to poop" for a number of days.  He was previously hospitalized for similar issue in March and another institution.  Sigmoid volvulus was reduced by colonoscopy and the patient apparently either signed out AMA refused surgery at that time.  It was noted that cardiology indicated high cardiac risk (I cannot find that note).  He was noted to have atrial flutter with controlled ventricular response as well as right bundle branch block. The patient is extremely poor historian.  He knows he has history of hypertension but cannot recall how long.  He denies any chest discomfort, major dizziness, syncope, CHF or fluid accumulation.  He was unaware of the irregular rhythm.    Past Medical History:  Diagnosis Date  . Alcohol abuse   . Arrhythmia   . Cataplexy   . Glaucoma   . Heart disease   . Hypertension   . Narcolepsy   . Narcolepsy     Past Surgical History:  Procedure Laterality Date  . HERNIA REPAIR         Inpatient Medications: Scheduled Meds: . amLODipine  2.5 mg Oral Daily  . folic acid  1 mg Oral Daily  . multivitamin with minerals  1 tablet Oral Daily  . thiamine  100 mg Oral Daily   Or  . thiamine  100 mg Intravenous Daily   Continuous Infusions: . dextrose 5 % and 0.9% NaCl 75 mL/hr at 02/12/21 0341   PRN  Meds: acetaminophen **OR** acetaminophen, LORazepam **OR** LORazepam, morphine injection, ondansetron **OR** ondansetron (ZOFRAN) IV  Allergies:   No Known Allergies  Social History:   Social History   Socioeconomic History  . Marital status: Legally Separated    Spouse name: Not on file  . Number of children: Not on file  . Years of education: Not on file  . Highest education level: Not on file  Occupational History  . Not on file  Tobacco Use  . Smoking status: Current Every Day Smoker    Packs/day: 1.00    Years: 30.00    Pack years: 30.00  . Smokeless tobacco: Never Used  Vaping Use  . Vaping Use: Never used  Substance and Sexual Activity  . Alcohol use: Yes    Alcohol/week: 21.0 standard drinks    Types: 21 Cans of beer per week    Comment: 12 pack of beer daily.   . Drug use: Yes    Types: Marijuana  . Sexual activity: Not on file  Other Topics Concern  . Not on file  Social History Narrative  . Not on file   Social Determinants of Health   Financial Resource Strain: Not on file  Food Insecurity: Not on file  Transportation Needs: Not on file  Physical Activity: Not on file  Stress: Not on file  Social Connections:  Not on file  Intimate Partner Violence: Not on file    Family History:   Noncontributory Family History  Family history unknown: Yes     ROS:  Please see the history of present illness.  Complete review of systems obtained, only positives already mentioned. History of remote head trauma as a young man. Previous history of abdominal swelling and inability to defecate when she was hospitalized in March. Chronic issues with his right leg which is smaller than the left.  Uses a cane All other ROS reviewed and negative.     Physical Exam/Data:   Vitals:   02/12/21 1245 02/12/21 1300 02/12/21 1315 02/12/21 1333  BP: 123/83 (!) 150/112 (!) 150/90 (!) 152/96  Pulse:  (!) 108 76 71  Resp: 20 15 15 18   Temp:    98 F (36.7 C)  TempSrc:     Oral  SpO2:  91% 94% 97%  Weight:      Height:        Intake/Output Summary (Last 24 hours) at 02/12/2021 1453 Last data filed at 02/12/2021 1233 Gross per 24 hour  Intake 540 ml  Output 1500 ml  Net -960 ml   Last 3 Weights 02/10/2021 02/10/2021 01/09/2021  Weight (lbs) 138 lb 3.7 oz 138 lb 135 lb  Weight (kg) 62.7 kg 62.596 kg 61.236 kg     Body mass index is 21.65 kg/m.  General:  Well nourished, well developed, in no acute distress HEENT: normal Lymph: no adenopathy Neck: no JVD Endocrine:  No thryomegaly Vascular: No carotid bruits; FA pulses 2+ bilaterally without bruits  Cardiac:  normal S1, S2; RRR; no murmur  Lungs:  clear to auscultation bilaterally, no wheezing, rhonchi or rales  Abd: soft, nontender, no hepatomegaly  Ext: no edema right leg is smaller than the left Musculoskeletal:   BUE  Skin: warm and dry  Neuro:   no focal abnormalities noted Psych:  Normal affect   EKG:  The EKG was personally reviewed and demonstrates: Atrial flutter with controlled ventricular response, right bundle branch block with left axis deviation Telemetry: : No telemetry available  Relevant CV Studies: No echo or chest x-ray are available  Laboratory Data:  High Sensitivity Troponin:  No results for input(s): TROPONINIHS in the last 720 hours.   Chemistry Recent Labs  Lab 02/10/21 1723 02/11/21 0812  NA 134* 137  K 4.3 3.9  CL 101 109  CO2 25 22  GLUCOSE 104* 105*  BUN 13 10  CREATININE 1.38* 1.00  CALCIUM 9.4 8.7*  GFRNONAA 55* >60  ANIONGAP 8 6    Recent Labs  Lab 02/10/21 1723  PROT 8.2*  ALBUMIN 4.8  AST 22  ALT 32  ALKPHOS 89  BILITOT 0.5   Hematology Recent Labs  Lab 02/10/21 1723 02/11/21 0812  WBC 7.1 5.6  RBC 5.13 4.50  HGB 16.2 14.3  HCT 49.2 42.3  MCV 95.9 94.0  MCH 31.6 31.8  MCHC 32.9 33.8  RDW 13.6 13.3  PLT 192 185   BNPNo results for input(s): BNP, PROBNP in the last 168 hours.  DDimer No results for input(s): DDIMER in the last 168  hours.   Radiology/Studies:  CT ABDOMEN PELVIS WO CONTRAST  Result Date: 02/10/2021 CLINICAL DATA:  Abdominal pain, constipation EXAM: CT ABDOMEN AND PELVIS WITHOUT CONTRAST TECHNIQUE: Multidetector CT imaging of the abdomen and pelvis was performed following the standard protocol without IV contrast. COMPARISON:  11/06/2020 FINDINGS: Lower chest: Lung bases are essentially clear. Hepatobiliary: Unenhanced liver  is unremarkable. Gallbladder is unremarkable. No intrahepatic or extrahepatic ductal dilatation. Pancreas: Within normal limits. Spleen: Within normal limits. Adrenals/Urinary Tract: 19 mm left adrenal adenoma (series 2/image 19). Right adrenal gland is within normal limits. Two right renal cysts measuring up to 4.0 cm in the right lower pole (series 2/image 30). Left kidney is within normal limits. No renal calculi or hydronephrosis. Mildly thick-walled bladder, although underdistended. Stomach/Bowel: Stomach is within normal limits. No evidence of small bowel obstruction. Dilated sigmoid colon with narrowing in the central mesenteric root (series 2/image 44). This is recurrent but less severe when compared to the prior. Sigmoid colon measures up to 6.2 cm on the current study, without wall thickening or mesenteric edema. Vascular/Lymphatic: No evidence of abdominal aortic aneurysm. Atherosclerotic calcifications of the abdominal aorta and branch vessels. No suspicious abdominopelvic lymphadenopathy. Reproductive: Prostatomegaly, suggesting BPH. Other: No abdominopelvic ascites. Musculoskeletal: Degenerative changes of the visualized thoracolumbar spine. 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 This appearance is recurrent but improved when compared to the prior. Additional ancillary findings as above. Electronically Signed   By: Julian Hy M.D.   On: 02/10/2021 19:39     Assessment and Plan:   1. Hypertension, likely hypertensive heart  disease 2. Conduction disease i.e. bifascicular block with atrial flutter with controlled ventricular sponsor despite not being on AV nodal blocking agents 3. History of alcohol and tobacco overuse  4. Current sigmoid volvulus 5. Abdominal aortic atherosclerosis on CT scan i.e. PVD  Recommendation:     The patient currently is hemodynamically stable.  We will check an echo LV function etc. (surprised that this was not done during prior hospitalization). He would appear to be a very poor candidate for chronic anticoagulation as I gather he is noncompliant but I suspect his primary care physician who he says he sees in his hometown might have a better handle on this. If surgery is to be done avoid significant volume overload. We can leave another note after the echo is completed  Thank you for having [place] see him for you   For questions or updates, please contact [place] Please consult [email or web address] for contact info under    Signed, Abel Presto, MD  02/12/2021 2:53 PM

## 2021-02-12 NOTE — Progress Notes (Signed)
Patient Demographics:    Marc Jimenez, is a 70 y.o. male, DOB - 1951/06/20, JFH:[id number]  Admit date - 02/10/2021   Admitting Physician Ejiroghene Arlyce Dice, MD  Outpatient Primary MD for the patient is [name], MD  LOS - 2   Chief Complaint  Patient presents with  . Constipation        Subjective:    Marc Jimenez today has no fevers, no emesis,  No chest pain,   -Asking for solid food after flex sig reduction of his volvulus  Assessment  & Plan :    Principal Problem:   Sigmoid volvulus ([place]) Active Problems:   Alcohol abuse   HTN (hypertension)  Brief Summary:- 70 y.o. male with medical history significant for alcohol abuse, narcolepsy, hypertension, heart disease who is a poor historian admitted on 02/10/2021 with recurrent sigmoid volvulus and AKI  A/p 1)Recurrent Sigmoid Colon Volvulus---Had prior colonoscopic reduction in March 2022 -- Patient previously refused surgical intervention -Patient is now willing to undergo surgery -Discussed with general surgeon GoLytely prep with sips started -No emesis -Status post flex sig on 02/12/2021 with reduction of volvulus -Postprocedure KUB shows no acute abnormalities -Plan is for partial colectomy by general surgeon in the next couple days   2)AKI----acute kidney injury --due to dehydration in the setting of poor oral intake due to volvulus and Lasix use Creatinine 1.38 >>1.0 -Overall resolved with hydration - renally adjust medications, avoid nephrotoxic agents / dehydration  / hypotension  3)Etoh Abuse-lorazepam per CIWA protocol continue multivitamin folic acid and thiamine - 4)HTN-IV labetalol as needed for elevated BP, continue to hold Lasix, restarted Norvasc  5)FEN-IV dextrose until oral intake is more reliable  6)Social/Ethics--patient is a poor historian, plan of care discussed with patient and his nephew [name] at  bedside -Patient is a full code  Disposition/Need for in-Hospital Stay- patient unable to be discharged at this time due to -recurrent sigmoid volvulus requiring surgical intervention, poor oral intake with AKI due to volvulus requiring IV fluids pending better tolerance of oral intake*  Status is: Inpatient  Remains inpatient appropriate because:Please see disposition above   Disposition: The patient is from: Home              Anticipated d/c is to: Home              Anticipated d/c date is: 3 days              Patient currently is not medically stable to d/c. Barriers: Not Clinically Stable-   Code Status : -  Code Status: Full Code   Family Communication:    (patient is alert, awake and coherent)  Discussed with Terrilee Files Consults  :  Gen surgery  DVT Prophylaxis  :   - SCDs  SCDs Start: 02/10/21 2309    Lab Results  Component Value Date   PLT 185 02/11/2021    Inpatient Medications  Scheduled Meds: . amLODipine  2.5 mg Oral Daily  . bisacodyl  10 mg Rectal Once  . folic acid  1 mg Oral Daily  . multivitamin with minerals  1 tablet Oral Daily  . thiamine  100 mg Oral Daily   Or  . thiamine  100 mg Intravenous Daily  Continuous Infusions: . dextrose 5 % and 0.9% NaCl 75 mL/hr at 02/12/21 1503   PRN Meds:.acetaminophen **OR** acetaminophen, LORazepam **OR** LORazepam, morphine injection, ondansetron **OR** ondansetron (ZOFRAN) IV    Anti-infectives (From admission, onward)   None        Objective:   Vitals:   02/12/21 1245 02/12/21 1300 02/12/21 1315 02/12/21 1333  BP: 123/83 (!) 150/112 (!) 150/90 (!) 152/96  Pulse:  (!) 108 76 71  Resp: 20 15 15 18   Temp:    98 F (36.7 C)  TempSrc:    Oral  SpO2:  91% 94% 97%  Weight:      Height:        Wt Readings from Last 3 Encounters:  02/10/21 62.7 kg  01/09/21 61.2 kg  09/24/19 64.4 kg     Intake/Output Summary (Last 24 hours) at 02/12/2021 1855 Last data filed at 02/12/2021 1854 Gross per 24  hour  Intake 990 ml  Output 1400 ml  Net -410 ml   Physical Exam  Gen:- Awake Alert,  In no apparent distress HEENT:- [place].AT, No sclera icterus Neck-Supple Neck,No JVD,.  Lungs-  CTAB , fair symmetrical air movement CV- S1, S2 normal, regular  Abd-  +ve B.Sounds, Abd Soft, no abdominal tenderness  , not distended Extremity/Skin:- No  edema, pedal pulses present  Psych-affect is appropriate, oriented x3 Neuro-no new focal deficits, no tremors   Data Review:   Micro Results Recent Results (from the past 240 hour(s))  SARS CORONAVIRUS 2 (TAT 6-24 HRS) Nasopharyngeal Nasopharyngeal Swab     Status: None   Collection Time: 02/10/21  9:24 PM   Specimen: Nasopharyngeal Swab  Result Value Ref Range Status   SARS Coronavirus 2 NEGATIVE NEGATIVE Final    Comment: (NOTE) SARS-CoV-2 target nucleic acids are NOT DETECTED.  The SARS-CoV-2 RNA is generally detectable in upper and lower respiratory specimens during the acute phase of infection. Negative results do not preclude SARS-CoV-2 infection, do not rule out co-infections with other pathogens, and should not be used as the sole basis for treatment or other patient management decisions. Negative results must be combined with clinical observations, patient history, and epidemiological information. The expected result is Negative.  Fact Sheet for Patients: SugarRoll.be  Fact Sheet for Healthcare Providers: https://www.woods-mathews.com/  This test is not yet approved or cleared by the [place] FDA and  has been authorized for detection and/or diagnosis of SARS-CoV-2 by FDA under an Emergency Use Authorization (EUA). This EUA will remain  in effect (meaning this test can be used) for the duration of the COVID-19 declaration under Se ction 564(b)(1) of the Act, 21 U.S.C. section 360bbb-3(b)(1), unless the authorization is terminated or revoked sooner.  Performed at [facility] [address] [place], [place] [address]     Radiology Reports CT ABDOMEN PELVIS WO CONTRAST  Result Date: 02/10/2021 CLINICAL DATA:  Abdominal pain, constipation EXAM: CT ABDOMEN AND PELVIS WITHOUT CONTRAST TECHNIQUE: Multidetector CT imaging of the abdomen and pelvis was performed following the standard protocol without IV contrast. COMPARISON:  11/06/2020 FINDINGS: Lower chest: Lung bases are essentially clear. Hepatobiliary: Unenhanced liver is unremarkable. Gallbladder is unremarkable. No intrahepatic or extrahepatic ductal dilatation. Pancreas: Within normal limits. Spleen: Within normal limits. Adrenals/Urinary Tract: 19 mm left adrenal adenoma (series 2/image 19). Right adrenal gland is within normal limits. Two right renal cysts measuring up to 4.0 cm in the right lower pole (series 2/image 30). Left kidney is within normal limits. No renal calculi  or hydronephrosis. Mildly thick-walled bladder, although underdistended. Stomach/Bowel: Stomach is within normal limits. No evidence of small bowel obstruction. Dilated sigmoid colon with narrowing in the central mesenteric root (series 2/image 44). This is recurrent but less severe when compared to the prior. Sigmoid colon measures up to 6.2 cm on the current study, without wall thickening or mesenteric edema. Vascular/Lymphatic: No evidence of abdominal aortic aneurysm. Atherosclerotic calcifications of the abdominal aorta and branch vessels. No suspicious abdominopelvic lymphadenopathy. Reproductive: Prostatomegaly, suggesting BPH. Other: No abdominopelvic ascites. Musculoskeletal: Degenerative changes of the visualized thoracolumbar spine. 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 This appearance is recurrent but improved when compared to the prior. Additional ancillary findings as above. Electronically Signed   By: Julian Hy M.D.   On: 02/10/2021 19:39   DG Abd 1 View  Result Date: 02/12/2021 CLINICAL  DATA:  Volvulus status post sigmoidoscopy. EXAM: ABDOMEN - 1 VIEW COMPARISON:  CT of February 10, 2021.  Radiograph of November 07, 2020. FINDINGS: Large amount of contrast and stool is seen in what appears to be the right colon. No significant bowel dilatation is noted currently. No abnormal calcifications are noted. IMPRESSION: No significant abnormal bowel dilatation is seen currently. Electronically Signed   By: Marijo Conception M.D.   On: 02/12/2021 14:53     CBC Recent Labs  Lab 02/10/21 1723 02/11/21 0812  WBC 7.1 5.6  HGB 16.2 14.3  HCT 49.2 42.3  PLT 192 185  MCV 95.9 94.0  MCH 31.6 31.8  MCHC 32.9 33.8  RDW 13.6 13.3  LYMPHSABS 2.5  --   MONOABS 0.5  --   EOSABS 0.1  --   BASOSABS 0.0  --     Chemistries  Recent Labs  Lab 02/10/21 1723 02/10/21 2212 02/11/21 0812  NA 134*  --  137  K 4.3  --  3.9  CL 101  --  109  CO2 25  --  22  GLUCOSE 104*  --  105*  BUN 13  --  10  CREATININE 1.38*  --  1.00  CALCIUM 9.4  --  8.7*  MG  --  2.2  --   AST 22  --   --   ALT 32  --   --   ALKPHOS 89  --   --   BILITOT 0.5  --   --    ------------------------------------------------------------------------------------------------------------------ No results for input(s): CHOL, HDL, LDLCALC, TRIG, CHOLHDL, LDLDIRECT in the last 72 hours.  No results found for: HGBA1C ------------------------------------------------------------------------------------------------------------------ No results for input(s): TSH, T4TOTAL, T3FREE, THYROIDAB in the last 72 hours.  Invalid input(s): FREET3 ------------------------------------------------------------------------------------------------------------------ No results for input(s): VITAMINB12, FOLATE, FERRITIN, TIBC, IRON, RETICCTPCT in the last 72 hours.  Coagulation profile No results for input(s): INR, PROTIME in the last 168 hours.  No results for input(s): DDIMER in the last 72 hours.  Cardiac Enzymes No results for input(s): CKMB,  TROPONINI, MYOGLOBIN in the last 168 hours.  Invalid input(s): CK ------------------------------------------------------------------------------------------------------------------ No results found for: BNP   Roxan Hockey M.D on 02/12/2021 at 6:55 PM  Go to [email or web address] - for contact info  [facility] - Office  [phone]

## 2021-02-13 ENCOUNTER — Inpatient Hospital Stay (HOSPITAL_COMMUNITY): Payer: Medicare HMO

## 2021-02-13 DIAGNOSIS — Z01810 Encounter for preprocedural cardiovascular examination: Secondary | ICD-10-CM

## 2021-02-13 DIAGNOSIS — I483 Typical atrial flutter: Secondary | ICD-10-CM

## 2021-02-13 DIAGNOSIS — I4892 Unspecified atrial flutter: Secondary | ICD-10-CM

## 2021-02-13 DIAGNOSIS — I10 Essential (primary) hypertension: Secondary | ICD-10-CM

## 2021-02-13 LAB — COMPREHENSIVE METABOLIC PANEL
ALT: 17 U/L (ref 0–44)
AST: 15 U/L (ref 15–41)
Albumin: 3.3 g/dL — ABNORMAL LOW (ref 3.5–5.0)
Alkaline Phosphatase: 62 U/L (ref 38–126)
Anion gap: 4 — ABNORMAL LOW (ref 5–15)
BUN: 6 mg/dL — ABNORMAL LOW (ref 8–23)
CO2: 24 mmol/L (ref 22–32)
Calcium: 8.6 mg/dL — ABNORMAL LOW (ref 8.9–10.3)
Chloride: 109 mmol/L (ref 98–111)
Creatinine, Ser: 0.9 mg/dL (ref 0.61–1.24)
GFR, Estimated: >60 mL/min (ref >60)
Glucose, Bld: 116 mg/dL — ABNORMAL HIGH (ref 70–99)
Potassium: 4 mmol/L (ref 3.5–5.1)
Sodium: 137 mmol/L (ref 135–145)
Total Bilirubin: 0.6 mg/dL (ref 0.3–1.2)
Total Protein: 5.8 g/dL — ABNORMAL LOW (ref 6.5–8.1)

## 2021-02-13 LAB — CBC
HCT: 38.9 % — ABNORMAL LOW (ref 39.0–52.0)
Hemoglobin: 13.1 g/dL (ref 13.0–17.0)
MCH: 31.7 pg (ref 26.0–34.0)
MCHC: 33.7 g/dL (ref 30.0–36.0)
MCV: 94.2 fL (ref 80.0–100.0)
Platelets: 174 10*3/uL (ref 150–400)
RBC: 4.13 MIL/uL — ABNORMAL LOW (ref 4.22–5.81)
RDW: 13.2 % (ref 11.5–15.5)
WBC: 4.7 10*3/uL (ref 4.0–10.5)
nRBC: 0 % (ref 0.0–0.2)

## 2021-02-13 LAB — ECHOCARDIOGRAM COMPLETE
Area-P 1/2: 4.8 cm2
Height: 67 in
P 1/2 time: 719 msec
S' Lateral: 2.7 cm
Weight: 2211.65 oz

## 2021-02-13 LAB — MAGNESIUM: Magnesium: 1.9 mg/dL (ref 1.7–2.4)

## 2021-02-13 LAB — PHOSPHORUS: Phosphorus: 2.9 mg/dL (ref 2.5–4.6)

## 2021-02-13 MED ORDER — NEOMYCIN SULFATE 500 MG PO TABS
1000.0000 mg | ORAL_TABLET | Freq: Three times a day (TID) | ORAL | Status: AC
  Administered 2021-02-13 (×3): 1000 mg via ORAL
  Filled 2021-02-13 (×3): qty 2

## 2021-02-13 MED ORDER — METRONIDAZOLE 500 MG PO TABS
500.0000 mg | ORAL_TABLET | Freq: Three times a day (TID) | ORAL | Status: AC
  Administered 2021-02-13 (×3): 500 mg via ORAL
  Filled 2021-02-13 (×3): qty 1

## 2021-02-13 MED ORDER — SODIUM CHLORIDE 0.9 % IV SOLN
1.0000 g | INTRAVENOUS | Status: AC
  Administered 2021-02-14: 1 g via INTRAVENOUS
  Filled 2021-02-13 (×2): qty 1

## 2021-02-13 MED ORDER — CHLORHEXIDINE GLUCONATE CLOTH 2 % EX PADS
6.0000 | MEDICATED_PAD | Freq: Once | CUTANEOUS | Status: AC
  Administered 2021-02-14: 6 via TOPICAL

## 2021-02-13 MED ORDER — ALVIMOPAN 12 MG PO CAPS
12.0000 mg | ORAL_CAPSULE | ORAL | Status: AC
  Administered 2021-02-14: 12 mg via ORAL
  Filled 2021-02-13: qty 1

## 2021-02-13 MED ORDER — PEG 3350-KCL-NA BICARB-NACL 420 G PO SOLR
4000.0000 mL | Freq: Once | ORAL | Status: AC
  Administered 2021-02-13: 4000 mL via ORAL

## 2021-02-13 MED ORDER — CHLORHEXIDINE GLUCONATE CLOTH 2 % EX PADS
6.0000 | MEDICATED_PAD | Freq: Once | CUTANEOUS | Status: AC
  Administered 2021-02-13: 6 via TOPICAL

## 2021-02-13 MED ORDER — MAGNESIUM HYDROXIDE 400 MG/5ML PO SUSP
30.0000 mL | Freq: Once | ORAL | Status: AC
  Administered 2021-02-13: 30 mL via ORAL
  Filled 2021-02-13: qty 30

## 2021-02-13 NOTE — Progress Notes (Signed)
Patient nephew [name] called. He said that he wants to be contacted in the morning by the Dr. He can best be reached at [phone].

## 2021-02-13 NOTE — Progress Notes (Signed)
1 Day Post-Op  Subjective: Patient appears confused as to why he now needs surgery.  He denies any abdominal pain.  Objective: Vital signs in last 24 hours: Temp:  [98 F (36.7 C)-98.3 F (36.8 C)] 98.1 F (36.7 C) (06/09 0754) Pulse Rate:  [46-108] 61 (06/09 0754) Resp:  [15-20] 16 (06/09 0754) BP: (115-174)/(73-112) 148/80 (06/09 0754) SpO2:  [91 %-99 %] 98 % (06/09 0754) Last BM Date: 02/12/21  Intake/Output from previous day: 06/08 0701 - 06/09 0700 In: 990 [P.O.:690; I.V.:300] Out: 1350 [Urine:1350] Intake/Output this shift: No intake/output data recorded.  General appearance: alert and no distress GI: soft, non-tender; bowel sounds normal; no masses,  no organomegaly  Lab Results:  Recent Labs    02/11/21 0812 02/13/21 0606  WBC 5.6 4.7  HGB 14.3 13.1  HCT 42.3 38.9*  PLT 185 174   BMET Recent Labs    02/11/21 0812 02/13/21 0606  NA 137 137  K 3.9 4.0  CL 109 109  CO2 22 24  GLUCOSE 105* 116*  BUN 10 6*  CREATININE 1.00 0.90  CALCIUM 8.7* 8.6*   PT/INR No results for input(s): LABPROT, INR in the last 72 hours.  Studies/Results: DG Abd 1 View  Result Date: 02/12/2021 CLINICAL DATA:  Volvulus status post sigmoidoscopy. EXAM: ABDOMEN - 1 VIEW COMPARISON:  CT of February 10, 2021.  Radiograph of November 07, 2020. FINDINGS: Large amount of contrast and stool is seen in what appears to be the right colon. No significant bowel dilatation is noted currently. No abnormal calcifications are noted. IMPRESSION: No significant abnormal bowel dilatation is seen currently. Electronically Signed   By: Marijo Conception M.D.   On: 02/12/2021 14:53    Anti-infectives: Anti-infectives (From admission, onward)    None       Assessment/Plan: s/p Procedure(s): FLEXIBLE SIGMOIDOSCOPY Impression: Sigmoid volvulus which has been decompressed by flexible sigmoidoscopy.  My intent is to perform a partial colectomy tomorrow.  Patient appears to be confused as to why he now needs  surgery.  I did discuss this with [name], his nephew, who will be coming into explained the need for surgery with the patient.  He is temporarily scheduled for partial colectomy tomorrow.  Cardiology consultation appreciated.  LOS: 3 days    Aviva Signs 02/13/2021

## 2021-02-13 NOTE — Progress Notes (Signed)
*  PRELIMINARY RESULTS* Echocardiogram 2D Echocardiogram has been performed.  Marc Jimenez 02/13/2021, 12:24 PM

## 2021-02-13 NOTE — Progress Notes (Signed)
Progress Note  Patient Name: Marc Jimenez Date of Encounter: 02/13/2021  Primary Cardiologist: New  Subjective   Patient not very interactive with questions, answers with one-word and does not elaborate.  No obvious chest pain or shortness of breath.  Inpatient Medications    Scheduled Meds:  amLODipine  2.5 mg Oral Daily   folic acid  1 mg Oral Daily   metroNIDAZOLE  500 mg Oral Q8H   multivitamin with minerals  1 tablet Oral Daily   neomycin  1,000 mg Oral Q8H   polyethylene glycol-electrolytes  4,000 mL Oral Once   thiamine  100 mg Oral Daily   Or   thiamine  100 mg Intravenous Daily   Continuous Infusions:  dextrose 5 % and 0.9% NaCl 50 mL/hr at 02/12/21 1949   PRN Meds: acetaminophen **OR** acetaminophen, LORazepam **OR** LORazepam, morphine injection, ondansetron **OR** ondansetron (ZOFRAN) IV   Vital Signs    Vitals:   02/12/21 1333 02/12/21 1947 02/13/21 0201 02/13/21 0754  BP: (!) 152/96 125/73 115/78 (!) 148/80  Pulse: 71 (!) 46 (!) 53 61  Resp: 18 20 16 16   Temp: 98 F (36.7 C) 98.3 F (36.8 C) 98.2 F (36.8 C) 98.1 F (36.7 C)  TempSrc: Oral  Oral Oral  SpO2: 97% 98% 99% 98%  Weight:      Height:        Intake/Output Summary (Last 24 hours) at 02/13/2021 1251 Last data filed at 02/13/2021 0857 Gross per 24 hour  Intake 1190 ml  Output 1750 ml  Net -560 ml   Filed Weights   02/10/21 1500 02/10/21 2238  Weight: 62.6 kg 62.7 kg    Telemetry    Atrial flutter with 4 1 block largely.  Personally reviewed.  ECG    An ECG dated 02/11/2021 was personally reviewed today and demonstrated:  Atrial flutter with 4 1 block, right bundle branch block and left anterior fascicular block.  Physical Exam   GEN: No acute distress.   Neck: No JVD. Cardiac: RRR, no murmur or gallop.  Respiratory: Nonlabored. Clear to auscultation bilaterally. GI: Soft, nontender, bowel sounds present. MS: No edema; No deformity. Neuro:  Nonfocal. Psych: Flat affect.   Moves all extremities.  Labs    Chemistry Recent Labs  Lab 02/10/21 1723 02/11/21 0812 02/13/21 0606  NA 134* 137 137  K 4.3 3.9 4.0  CL 101 109 109  CO2 25 22 24   GLUCOSE 104* 105* 116*  BUN 13 10 6*  CREATININE 1.38* 1.00 0.90  CALCIUM 9.4 8.7* 8.6*  PROT 8.2*  --  5.8*  ALBUMIN 4.8  --  3.3*  AST 22  --  15  ALT 32  --  17  ALKPHOS 89  --  62  BILITOT 0.5  --  0.6  GFRNONAA 55* >60 >60  ANIONGAP 8 6 4*     Hematology Recent Labs  Lab 02/10/21 1723 02/11/21 0812 02/13/21 0606  WBC 7.1 5.6 4.7  RBC 5.13 4.50 4.13*  HGB 16.2 14.3 13.1  HCT 49.2 42.3 38.9*  MCV 95.9 94.0 94.2  MCH 31.6 31.8 31.7  MCHC 32.9 33.8 33.7  RDW 13.6 13.3 13.2  PLT 192 185 174    Radiology    DG Abd 1 View  Result Date: 02/12/2021 CLINICAL DATA:  Volvulus status post sigmoidoscopy. EXAM: ABDOMEN - 1 VIEW COMPARISON:  CT of February 10, 2021.  Radiograph of November 07, 2020. FINDINGS: Large amount of contrast and stool is seen in  what appears to be the right colon. No significant bowel dilatation is noted currently. No abnormal calcifications are noted. IMPRESSION: No significant abnormal bowel dilatation is seen currently. Electronically Signed   By: Marijo Conception M.D.   On: 02/12/2021 14:53   ECHOCARDIOGRAM COMPLETE  Result Date: 02/13/2021    ECHOCARDIOGRAM REPORT   Patient Name:   Marc Jimenez Date of Exam: 02/13/2021 Medical Rec #:  [id number]        Height:       67.0 in Accession #:    [id number]       Weight:       138.2 lb Date of Birth:  17-Jul-1951         BSA:          1.728 m Patient Age:    70 years         BP:           148/80 mmHg Patient Gender: M                HR:           61 bpm. Exam Location:  [facility] Procedure: 2D Echo, Cardiac Doppler and Color Doppler Indications:    Atrial Flutter I48.92  History:        Patient has no prior history of Echocardiogram examinations.                 Risk Factors:Hypertension. Alcohol abuse.  Sonographer:    Alvino Chapel RCS Referring  Phys: [id number] [place]  1. Left ventricular ejection fraction, by estimation, is 60 to 65%. The left ventricle has normal function. The left ventricle has no regional wall motion abnormalities. There is moderate left ventricular hypertrophy. Left ventricular diastolic parameters are indeterminate.  2. Right ventricular systolic function is normal. The right ventricular size is normal. There is normal pulmonary artery systolic pressure. The estimated right ventricular systolic pressure is 79.8 mmHg.  3. Left atrial size was mild to moderately dilated.  4. Right atrial size was mildly dilated.  5. The mitral valve is grossly normal. Mild mitral valve regurgitation.  6. The aortic valve is tricuspid. Aortic valve regurgitation is mild. Aortic regurgitation PHT measures 719 msec.  7. The inferior vena cava is normal in size with greater than 50% respiratory variability, suggesting right atrial pressure of 3 mmHg. FINDINGS  Left Ventricle: Left ventricular ejection fraction, by estimation, is 60 to 65%. The left ventricle has normal function. The left ventricle has no regional wall motion abnormalities. The left ventricular internal cavity size was normal in size. There is  moderate left ventricular hypertrophy. Left ventricular diastolic parameters are indeterminate. Right Ventricle: The right ventricular size is normal. No increase in right ventricular wall thickness. Right ventricular systolic function is normal. There is normal pulmonary artery systolic pressure. The tricuspid regurgitant velocity is 2.45 m/s, and  with an assumed right atrial pressure of 3 mmHg, the estimated right ventricular systolic pressure is 92.1 mmHg. Left Atrium: Left atrial size was mild to moderately dilated. Right Atrium: Right atrial size was mildly dilated. Pericardium: There is no evidence of pericardial effusion. Mitral Valve: The mitral valve is grossly normal. There is mild thickening of the mitral valve leaflet(s).  Mild mitral valve regurgitation. Tricuspid Valve: The tricuspid valve is grossly normal. Tricuspid valve regurgitation is mild. Aortic Valve: The aortic valve is tricuspid. Aortic valve regurgitation is mild. Aortic regurgitation PHT measures 719 msec. Pulmonic Valve: The pulmonic valve was grossly  normal. Pulmonic valve regurgitation is trivial. Aorta: The aortic root is normal in size and structure. Venous: The inferior vena cava is normal in size with greater than 50% respiratory variability, suggesting right atrial pressure of 3 mmHg. IAS/Shunts: No atrial level shunt detected by color flow Doppler.  LEFT VENTRICLE PLAX 2D LVIDd:         4.20 cm  Diastology LVIDs:         2.70 cm  LV e' medial:    8.27 cm/s LV PW:         1.50 cm  LV E/e' medial:  9.3 LV IVS:        1.30 cm  LV e' lateral:   11.40 cm/s LVOT diam:     2.00 cm  LV E/e' lateral: 6.8 LV SV:         53 LV SV Index:   31 LVOT Area:     3.14 cm  RIGHT VENTRICLE RV S prime:     12.80 cm/s TAPSE (M-mode): 2.0 cm LEFT ATRIUM             Index       RIGHT ATRIUM           Index LA diam:        3.40 cm 1.97 cm/m  RA Area:     21.80 cm LA Vol (A2C):   73.5 ml 42.52 ml/m RA Volume:   63.00 ml  36.45 ml/m LA Vol (A4C):   73.4 ml 42.47 ml/m LA Biplane Vol: 73.4 ml 42.47 ml/m  AORTIC VALVE LVOT Vmax:   86.95 cm/s LVOT Vmean:  55.500 cm/s LVOT VTI:    0.168 m AI PHT:      719 msec  AORTA Ao Root diam: 3.70 cm MITRAL VALVE               TRICUSPID VALVE MV Area (PHT): 4.80 cm    TR Peak grad:   24.0 mmHg MV Decel Time: 158 msec    TR Vmax:        245.00 cm/s MV E velocity: 77.10 cm/s MV A velocity: 55.50 cm/s  SHUNTS MV E/A ratio:  1.39        Systemic VTI:  0.17 m                            Systemic Diam: 2.00 cm Rozann Lesches MD Electronically signed by Rozann Lesches MD Signature Date/Time: 02/13/2021/12:50:35 PM    Final      Assessment & Plan    1.  Preoperative cardiac assessment in a 70 year old male with history of hypertension, conduction  system disease with persistent atrial flutter and controlled ventricular response (right bundle branch block and left anterior fascicular block), atherosclerosis evident by CT imaging, and history of alcohol and tobacco use.  He presented with sigmoid volvulus, decompressed by flexible sigmoidoscopy and now being considered for partial colectomy under general anesthesia by Dr. Arnoldo Morale.  Patient is hemodynamically stable at this time, activity level not entirely certain at baseline.  Echocardiogram shows normal LVEF at 60 to 65% and normal estimated RVSP.  Based on available information and RCRI cardiac risk calculator, perioperative risk is intermediate, likely class III at 6.6% chance of major adverse cardiac event.  Would be most suspicious for bradycardia events based on underlying conduction system disease.  2.  Persistent/permanent atrial flutter with 4:1 block and controlled heart rate.  CHA2DS2-VASc score is 3.  Based  on history of noncompliance, likely not an optimal candidate for anticoagulation.  3.  Essential hypertension, on Norvasc as an outpatient.  As noted above, overall intermediate risk for surgery as planned.  Complication related to arrhythmia would be of most concern based on available information, at this time however his heart rate is controlled and he is hemodynamically stable.  No clear indication for further ischemic testing at this time.  Signed, Rozann Lesches, MD  02/13/2021, 12:51 PM

## 2021-02-13 NOTE — Progress Notes (Signed)
Patient Demographics:    Marc Jimenez, is a 70 y.o. male, DOB - 01-Sep-1951, VEH:[id number]  Admit date - 02/10/2021   Admitting Physician Ejiroghene Arlyce Dice, MD  Outpatient Primary MD for the patient is [name], MD  LOS - 3   Chief Complaint  Patient presents with   Constipation        Subjective:    Marc Jimenez today has no fevers, no emesis,  No chest pain,   -Resting comfortably Denies nausea or significant abdominal pain at this time  Assessment  & Plan :    Principal Problem:   Sigmoid volvulus ([place]) Active Problems:   Alcohol abuse   HTN (hypertension)   Preoperative cardiovascular examination   Typical atrial flutter [facility])  Brief Summary:- 70 y.o. male with medical history significant for alcohol abuse, narcolepsy, hypertension, heart disease who is a poor historian admitted on 02/10/2021 with recurrent sigmoid volvulus and AKI  A/p 1)Recurrent Sigmoid Colon Volvulus---Had prior colonoscopic reduction in March 2022 -- Patient previously refused surgical intervention -Patient is now willing to undergo surgery -Discussed with general surgeon GoLytely prep with sips started -No emesis -Status post flex sig on 02/12/2021 with reduction of volvulus -Postprocedure KUB shows no acute abnormalities -Plan is for partial colectomy by general surgeon on 02/14/21   2)AKI----acute kidney injury --due to dehydration in the setting of poor oral intake due to volvulus and Lasix use Creatinine 1.38 >>1.0 -Overall resolved with hydration - renally adjust medications, avoid nephrotoxic agents / dehydration  / hypotension  3)Etoh Abuse-lorazepam per CIWA protocol continue multivitamin folic acid and thiamine - 4)HTN-IV labetalol as needed for elevated BP, continue to hold Lasix, restarted Norvasc  5)FEN-IV dextrose until oral intake is more reliable  6)Social/Ethics--patient is a poor  historian, plan of care discussed with patient and his nephew [name] at bedside -Patient is a full code  7) persistent atrial flutter with 4-1 block--- rate appears controlled, CHA2DS2-VASc score is 3 -Not a candidate for anticoagulation due to being noncompliant -Cardiology input appreciated  Disposition/Need for in-Hospital Stay- patient unable to be discharged at this time due to -recurrent sigmoid volvulus requiring surgical intervention, poor oral intake with AKI due to volvulus requiring IV fluids pending better tolerance of oral intake*  Status is: Inpatient  Remains inpatient appropriate because: Please see disposition above   Disposition: The patient is from: Home              Anticipated d/c is to: Home              Anticipated d/c date is: 3 days              Patient currently is not medically stable to d/c. Barriers: Not Clinically Stable-   Code Status : -  Code Status: Full Code   Family Communication:    (patient is alert, awake and coherent)  Discussed with Terrilee Files Consults  :  Gen surgery  DVT Prophylaxis  :   - SCDs  SCDs Start: 02/10/21 2309    Lab Results  Component Value Date   PLT 174 02/13/2021    Inpatient Medications  Scheduled Meds:  amLODipine  2.5 mg Oral Daily   folic acid  1 mg Oral Daily  magnesium hydroxide  30 mL Oral Once   metroNIDAZOLE  500 mg Oral Q8H   multivitamin with minerals  1 tablet Oral Daily   neomycin  1,000 mg Oral Q8H   thiamine  100 mg Oral Daily   Or   thiamine  100 mg Intravenous Daily   Continuous Infusions:  dextrose 5 % and 0.9% NaCl 50 mL/hr at 02/12/21 1949   PRN Meds:.acetaminophen **OR** acetaminophen, LORazepam **OR** LORazepam, morphine injection, ondansetron **OR** ondansetron (ZOFRAN) IV    Anti-infectives (From admission, onward)    Start     Dose/Rate Route Frequency Ordered Stop   02/13/21 0945  neomycin (MYCIFRADIN) tablet 1,000 mg        1,000 mg Oral Every 8 hours 02/13/21 0855  02/14/21 0559   02/13/21 0945  metroNIDAZOLE (FLAGYL) tablet 500 mg        500 mg Oral Every 8 hours 02/13/21 0855 02/14/21 0559         Objective:   Vitals:   02/12/21 1947 02/13/21 0201 02/13/21 0754 02/13/21 1309  BP: 125/73 115/78 (!) 148/80 124/90  Pulse: (!) 46 (!) 53 61 (!) 50  Resp: 20 16 16 18   Temp: 98.3 F (36.8 C) 98.2 F (36.8 C) 98.1 F (36.7 C) 97.8 F (36.6 C)  TempSrc:  Oral Oral Oral  SpO2: 98% 99% 98% 100%  Weight:      Height:        Wt Readings from Last 3 Encounters:  02/10/21 62.7 kg  01/09/21 61.2 kg  09/24/19 64.4 kg     Intake/Output Summary (Last 24 hours) at 02/13/2021 1811 Last data filed at 02/13/2021 1600 Gross per 24 hour  Intake 1190 ml  Output 1851 ml  Net -661 ml   Physical Exam  Gen:- Awake Alert,  In no apparent distress HEENT:- [place].AT, No sclera icterus Neck-Supple Neck,No JVD,.  Lungs-  CTAB , fair symmetrical air movement CV- S1, S2 normal, regular  Abd-  +ve B.Sounds, Abd Soft, no abdominal tenderness  , not distended Extremity/Skin:- No  edema, pedal pulses present  Psych-affect is appropriate, oriented x3 Neuro-no new focal deficits, no tremors   Data Review:   Micro Results Recent Results (from the past 240 hour(s))  SARS CORONAVIRUS 2 (TAT 6-24 HRS) Nasopharyngeal Nasopharyngeal Swab     Status: None   Collection Time: 02/10/21  9:24 PM   Specimen: Nasopharyngeal Swab  Result Value Ref Range Status   SARS Coronavirus 2 NEGATIVE NEGATIVE Final    Comment: (NOTE) SARS-CoV-2 target nucleic acids are NOT DETECTED.  The SARS-CoV-2 RNA is generally detectable in upper and lower respiratory specimens during the acute phase of infection. Negative results do not preclude SARS-CoV-2 infection, do not rule out co-infections with other pathogens, and should not be used as the sole basis for treatment or other patient management decisions. Negative results must be combined with clinical observations, patient history, and  epidemiological information. The expected result is Negative.  Fact Sheet for Patients: SugarRoll.be  Fact Sheet for Healthcare Providers: https://www.woods-mathews.com/  This test is not yet approved or cleared by the [place] FDA and  has been authorized for detection and/or diagnosis of SARS-CoV-2 by FDA under an Emergency Use Authorization (EUA). This EUA will remain  in effect (meaning this test can be used) for the duration of the COVID-19 declaration under Se ction 564(b)(1) of the Act, 21 U.S.C. section 360bbb-3(b)(1), unless the authorization is terminated or revoked sooner.  Performed at [facility], [address] [place], [place] [address]     Radiology Reports CT ABDOMEN PELVIS WO CONTRAST  Result Date: 02/10/2021 CLINICAL DATA:  Abdominal pain, constipation EXAM: CT ABDOMEN AND PELVIS WITHOUT CONTRAST TECHNIQUE: Multidetector CT imaging of the abdomen and pelvis was performed following the standard protocol without IV contrast. COMPARISON:  11/06/2020 FINDINGS: Lower chest: Lung bases are essentially clear. Hepatobiliary: Unenhanced liver is unremarkable. Gallbladder is unremarkable. No intrahepatic or extrahepatic ductal dilatation. Pancreas: Within normal limits. Spleen: Within normal limits. Adrenals/Urinary Tract: 19 mm left adrenal adenoma (series 2/image 19). Right adrenal gland is within normal limits. Two right renal cysts measuring up to 4.0 cm in the right lower pole (series 2/image 30). Left kidney is within normal limits. No renal calculi or hydronephrosis. Mildly thick-walled bladder, although underdistended. Stomach/Bowel: Stomach is within normal limits. No evidence of small bowel obstruction. Dilated sigmoid colon with narrowing in the central mesenteric root (series 2/image 44). This is recurrent but less severe when compared to the prior. Sigmoid colon measures up to 6.2 cm on the current study, without wall  thickening or mesenteric edema. Vascular/Lymphatic: No evidence of abdominal aortic aneurysm. Atherosclerotic calcifications of the abdominal aorta and branch vessels. No suspicious abdominopelvic lymphadenopathy. Reproductive: Prostatomegaly, suggesting BPH. Other: No abdominopelvic ascites. Musculoskeletal: Degenerative changes of the visualized thoracolumbar spine. 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 This appearance is recurrent but improved when compared to the prior. Additional ancillary findings as above. Electronically Signed   By: Julian Hy M.D.   On: 02/10/2021 19:39   DG Abd 1 View  Result Date: 02/12/2021 CLINICAL DATA:  Volvulus status post sigmoidoscopy. EXAM: ABDOMEN - 1 VIEW COMPARISON:  CT of February 10, 2021.  Radiograph of November 07, 2020. FINDINGS: Large amount of contrast and stool is seen in what appears to be the right colon. No significant bowel dilatation is noted currently. No abnormal calcifications are noted. IMPRESSION: No significant abnormal bowel dilatation is seen currently. Electronically Signed   By: Marijo Conception M.D.   On: 02/12/2021 14:53   ECHOCARDIOGRAM COMPLETE  Result Date: 02/13/2021    ECHOCARDIOGRAM REPORT   Patient Name:   KOBEN DAMAN Date of Exam: 02/13/2021 Medical Rec #:  [id number]        Height:       67.0 in Accession #:    [id number]       Weight:       138.2 lb Date of Birth:  [birth year]         BSA:          1.728 m Patient Age:    71 years         BP:           148/80 mmHg Patient Gender: M                HR:           61 bpm. Exam Location:  [facility] Procedure: 2D Echo, Cardiac Doppler and Color Doppler Indications:    Atrial Flutter I48.92  History:        Patient has no prior history of Echocardiogram examinations.                 Risk Factors:Hypertension. Alcohol abuse.  Sonographer:    Alvino Chapel RCS Referring Phys: [id number] [place]  1. Left ventricular ejection fraction, by  estimation, is 60 to 65%. The left ventricle has normal function. The left ventricle has no regional  wall motion abnormalities. There is moderate left ventricular hypertrophy. Left ventricular diastolic parameters are indeterminate.  2. Right ventricular systolic function is normal. The right ventricular size is normal. There is normal pulmonary artery systolic pressure. The estimated right ventricular systolic pressure is 63.1 mmHg.  3. Left atrial size was mild to moderately dilated.  4. Right atrial size was mildly dilated.  5. The mitral valve is grossly normal. Mild mitral valve regurgitation.  6. The aortic valve is tricuspid. Aortic valve regurgitation is mild. Aortic regurgitation PHT measures 719 msec.  7. The inferior vena cava is normal in size with greater than 50% respiratory variability, suggesting right atrial pressure of 3 mmHg. FINDINGS  Left Ventricle: Left ventricular ejection fraction, by estimation, is 60 to 65%. The left ventricle has normal function. The left ventricle has no regional wall motion abnormalities. The left ventricular internal cavity size was normal in size. There is  moderate left ventricular hypertrophy. Left ventricular diastolic parameters are indeterminate. Right Ventricle: The right ventricular size is normal. No increase in right ventricular wall thickness. Right ventricular systolic function is normal. There is normal pulmonary artery systolic pressure. The tricuspid regurgitant velocity is 2.45 m/s, and  with an assumed right atrial pressure of 3 mmHg, the estimated right ventricular systolic pressure is 49.7 mmHg. Left Atrium: Left atrial size was mild to moderately dilated. Right Atrium: Right atrial size was mildly dilated. Pericardium: There is no evidence of pericardial effusion. Mitral Valve: The mitral valve is grossly normal. There is mild thickening of the mitral valve leaflet(s). Mild mitral valve regurgitation. Tricuspid Valve: The tricuspid valve is grossly  normal. Tricuspid valve regurgitation is mild. Aortic Valve: The aortic valve is tricuspid. Aortic valve regurgitation is mild. Aortic regurgitation PHT measures 719 msec. Pulmonic Valve: The pulmonic valve was grossly normal. Pulmonic valve regurgitation is trivial. Aorta: The aortic root is normal in size and structure. Venous: The inferior vena cava is normal in size with greater than 50% respiratory variability, suggesting right atrial pressure of 3 mmHg. IAS/Shunts: No atrial level shunt detected by color flow Doppler.  LEFT VENTRICLE PLAX 2D LVIDd:         4.20 cm  Diastology LVIDs:         2.70 cm  LV e' medial:    8.27 cm/s LV PW:         1.50 cm  LV E/e' medial:  9.3 LV IVS:        1.30 cm  LV e' lateral:   11.40 cm/s LVOT diam:     2.00 cm  LV E/e' lateral: 6.8 LV SV:         53 LV SV Index:   31 LVOT Area:     3.14 cm  RIGHT VENTRICLE RV S prime:     12.80 cm/s TAPSE (M-mode): 2.0 cm LEFT ATRIUM             Index       RIGHT ATRIUM           Index LA diam:        3.40 cm 1.97 cm/m  RA Area:     21.80 cm LA Vol (A2C):   73.5 ml 42.52 ml/m RA Volume:   63.00 ml  36.45 ml/m LA Vol (A4C):   73.4 ml 42.47 ml/m LA Biplane Vol: 73.4 ml 42.47 ml/m  AORTIC VALVE LVOT Vmax:   86.95 cm/s LVOT Vmean:  55.500 cm/s LVOT VTI:    0.168 m AI PHT:  719 msec  AORTA Ao Root diam: 3.70 cm MITRAL VALVE               TRICUSPID VALVE MV Area (PHT): 4.80 cm    TR Peak grad:   24.0 mmHg MV Decel Time: 158 msec    TR Vmax:        245.00 cm/s MV E velocity: 77.10 cm/s MV A velocity: 55.50 cm/s  SHUNTS MV E/A ratio:  1.39        Systemic VTI:  0.17 m                            Systemic Diam: 2.00 cm Rozann Lesches MD Electronically signed by Rozann Lesches MD Signature Date/Time: 02/13/2021/12:50:35 PM    Final       CBC Recent Labs  Lab 02/10/21 1723 02/11/21 0812 02/13/21 0606  WBC 7.1 5.6 4.7  HGB 16.2 14.3 13.1  HCT 49.2 42.3 38.9*  PLT 192 185 174  MCV 95.9 94.0 94.2  MCH 31.6 31.8 31.7  MCHC 32.9 33.8  33.7  RDW 13.6 13.3 13.2  LYMPHSABS 2.5  --   --   MONOABS 0.5  --   --   EOSABS 0.1  --   --   BASOSABS 0.0  --   --     Chemistries  Recent Labs  Lab 02/10/21 1723 02/10/21 2212 02/11/21 0812 02/13/21 0606  NA 134*  --  137 137  K 4.3  --  3.9 4.0  CL 101  --  109 109  CO2 25  --  22 24  GLUCOSE 104*  --  105* 116*  BUN 13  --  10 6*  CREATININE 1.38*  --  1.00 0.90  CALCIUM 9.4  --  8.7* 8.6*  MG  --  2.2  --  1.9  AST 22  --   --  15  ALT 32  --   --  17  ALKPHOS 89  --   --  62  BILITOT 0.5  --   --  0.6   ------------------------------------------------------------------------------------------------------------------ No results for input(s): CHOL, HDL, LDLCALC, TRIG, CHOLHDL, LDLDIRECT in the last 72 hours.  No results found for: HGBA1C ------------------------------------------------------------------------------------------------------------------ No results for input(s): TSH, T4TOTAL, T3FREE, THYROIDAB in the last 72 hours.  Invalid input(s): FREET3 ------------------------------------------------------------------------------------------------------------------ No results for input(s): VITAMINB12, FOLATE, FERRITIN, TIBC, IRON, RETICCTPCT in the last 72 hours.  Coagulation profile No results for input(s): INR, PROTIME in the last 168 hours.  No results for input(s): DDIMER in the last 72 hours.  Cardiac Enzymes No results for input(s): CKMB, TROPONINI, MYOGLOBIN in the last 168 hours.  Invalid input(s): CK ------------------------------------------------------------------------------------------------------------------ No results found for: BNP   Roxan Hockey M.D on 02/13/2021 at 6:11 PM  Go to [email or web address] - for contact info  [facility] - Office  [phone]

## 2021-02-13 NOTE — H&P (View-Only) (Signed)
1 Day Post-Op  Subjective: Patient appears confused as to why he now needs surgery.  He denies any abdominal pain.  Objective: Vital signs in last 24 hours: Temp:  [98 F (36.7 C)-98.3 F (36.8 C)] 98.1 F (36.7 C) (06/09 0754) Pulse Rate:  [46-108] 61 (06/09 0754) Resp:  [15-20] 16 (06/09 0754) BP: (115-174)/(73-112) 148/80 (06/09 0754) SpO2:  [91 %-99 %] 98 % (06/09 0754) Last BM Date: 02/12/21  Intake/Output from previous day: 06/08 0701 - 06/09 0700 In: 990 [P.O.:690; I.V.:300] Out: 1350 [Urine:1350] Intake/Output this shift: No intake/output data recorded.  General appearance: alert and no distress GI: soft, non-tender; bowel sounds normal; no masses,  no organomegaly  Lab Results:  Recent Labs    02/11/21 0812 02/13/21 0606  WBC 5.6 4.7  HGB 14.3 13.1  HCT 42.3 38.9*  PLT 185 174   BMET Recent Labs    02/11/21 0812 02/13/21 0606  NA 137 137  K 3.9 4.0  CL 109 109  CO2 22 24  GLUCOSE 105* 116*  BUN 10 6*  CREATININE 1.00 0.90  CALCIUM 8.7* 8.6*   PT/INR No results for input(s): LABPROT, INR in the last 72 hours.  Studies/Results: DG Abd 1 View  Result Date: 02/12/2021 CLINICAL DATA:  Volvulus status post sigmoidoscopy. EXAM: ABDOMEN - 1 VIEW COMPARISON:  CT of February 10, 2021.  Radiograph of November 07, 2020. FINDINGS: Large amount of contrast and stool is seen in what appears to be the right colon. No significant bowel dilatation is noted currently. No abnormal calcifications are noted. IMPRESSION: No significant abnormal bowel dilatation is seen currently. Electronically Signed   By: Marijo Conception M.D.   On: 02/12/2021 14:53    Anti-infectives: Anti-infectives (From admission, onward)    None       Assessment/Plan: s/p Procedure(s): FLEXIBLE SIGMOIDOSCOPY Impression: Sigmoid volvulus which has been decompressed by flexible sigmoidoscopy.  My intent is to perform a partial colectomy tomorrow.  Patient appears to be confused as to why he now needs  surgery.  I did discuss this with [name], his nephew, who will be coming into explained the need for surgery with the patient.  He is temporarily scheduled for partial colectomy tomorrow.  Cardiology consultation appreciated.  LOS: 3 days    Aviva Signs 02/13/2021

## 2021-02-14 ENCOUNTER — Encounter (HOSPITAL_COMMUNITY)
Admission: EM | Disposition: A | Discharge status: Skilled Nursing Facility | Payer: Self-pay | Source: Home / Self Care | Attending: Family Medicine

## 2021-02-14 ENCOUNTER — Inpatient Hospital Stay (HOSPITAL_COMMUNITY): Payer: Medicare HMO | Admitting: Certified Registered"

## 2021-02-14 ENCOUNTER — Encounter (HOSPITAL_COMMUNITY): Payer: Self-pay | Admitting: Internal Medicine

## 2021-02-14 HISTORY — PX: PARTIAL COLECTOMY: SHX5273

## 2021-02-14 LAB — BASIC METABOLIC PANEL
Anion gap: 5 (ref 5–15)
BUN: 7 mg/dL — ABNORMAL LOW (ref 8–23)
CO2: 24 mmol/L (ref 22–32)
Calcium: 8.6 mg/dL — ABNORMAL LOW (ref 8.9–10.3)
Chloride: 108 mmol/L (ref 98–111)
Creatinine, Ser: 0.92 mg/dL (ref 0.61–1.24)
GFR, Estimated: >60 mL/min (ref >60)
Glucose, Bld: 90 mg/dL (ref 70–99)
Potassium: 3.8 mmol/L (ref 3.5–5.1)
Sodium: 137 mmol/L (ref 135–145)

## 2021-02-14 LAB — CBC
HCT: 40.1 % (ref 39.0–52.0)
Hemoglobin: 13.4 g/dL (ref 13.0–17.0)
MCH: 31.5 pg (ref 26.0–34.0)
MCHC: 33.4 g/dL (ref 30.0–36.0)
MCV: 94.1 fL (ref 80.0–100.0)
Platelets: 187 10*3/uL (ref 150–400)
RBC: 4.26 MIL/uL (ref 4.22–5.81)
RDW: 13.2 % (ref 11.5–15.5)
WBC: 5.1 10*3/uL (ref 4.0–10.5)
nRBC: 0 % (ref 0.0–0.2)

## 2021-02-14 LAB — SURGICAL PCR SCREEN
MRSA, PCR: NEGATIVE
Staphylococcus aureus: NEGATIVE

## 2021-02-14 SURGERY — COLECTOMY, PARTIAL
Anesthesia: General | Site: Abdomen

## 2021-02-14 MED ORDER — PHENYLEPHRINE HCL (PRESSORS) 10 MG/ML IV SOLN
INTRAVENOUS | Status: DC | PRN
  Administered 2021-02-14: 160 ug via INTRAVENOUS
  Administered 2021-02-14: 140 ug via INTRAVENOUS
  Administered 2021-02-14: 100 ug via INTRAVENOUS

## 2021-02-14 MED ORDER — FENTANYL CITRATE (PF) 100 MCG/2ML IJ SOLN
INTRAMUSCULAR | Status: DC | PRN
  Administered 2021-02-14 (×2): 100 ug via INTRAVENOUS
  Administered 2021-02-14: 50 ug via INTRAVENOUS

## 2021-02-14 MED ORDER — CHLORHEXIDINE GLUCONATE 0.12 % MT SOLN
15.0000 mL | Freq: Once | OROMUCOSAL | Status: AC
  Administered 2021-02-14: 15 mL via OROMUCOSAL

## 2021-02-14 MED ORDER — PHENYLEPHRINE HCL-NACL 10-0.9 MG/250ML-% IV SOLN
INTRAVENOUS | Status: AC
  Filled 2021-02-14: qty 250

## 2021-02-14 MED ORDER — LACTATED RINGERS IV SOLN
INTRAVENOUS | Status: DC
  Administered 2021-02-14: 1000 mL via INTRAVENOUS

## 2021-02-14 MED ORDER — SIMETHICONE 80 MG PO CHEW
40.0000 mg | CHEWABLE_TABLET | Freq: Four times a day (QID) | ORAL | Status: DC | PRN
Start: 2021-02-14 — End: 2021-02-18
  Filled 2021-02-14: qty 1

## 2021-02-14 MED ORDER — ONDANSETRON HCL 4 MG/2ML IJ SOLN
INTRAMUSCULAR | Status: AC
  Filled 2021-02-14: qty 2

## 2021-02-14 MED ORDER — MIDAZOLAM HCL 5 MG/5ML IJ SOLN
INTRAMUSCULAR | Status: DC | PRN
  Administered 2021-02-14: 2 mg via INTRAVENOUS

## 2021-02-14 MED ORDER — POVIDONE-IODINE 10 % EX OINT
TOPICAL_OINTMENT | CUTANEOUS | Status: AC
  Filled 2021-02-14: qty 1

## 2021-02-14 MED ORDER — PROPOFOL 10 MG/ML IV BOLUS
INTRAVENOUS | Status: DC | PRN
  Administered 2021-02-14: 30 mg via INTRAVENOUS
  Administered 2021-02-14: 20 mg via INTRAVENOUS
  Administered 2021-02-14: 150 mg via INTRAVENOUS

## 2021-02-14 MED ORDER — METOPROLOL TARTRATE 5 MG/5ML IV SOLN
INTRAVENOUS | Status: DC | PRN
  Administered 2021-02-14: 2 mg via INTRAVENOUS

## 2021-02-14 MED ORDER — DEXAMETHASONE SODIUM PHOSPHATE 10 MG/ML IJ SOLN
INTRAMUSCULAR | Status: DC | PRN
  Administered 2021-02-14: 8 mg via INTRAVENOUS

## 2021-02-14 MED ORDER — LACTATED RINGERS IV BOLUS
1000.0000 mL | Freq: Once | INTRAVENOUS | Status: AC
  Administered 2021-02-14: 1000 mL via INTRAVENOUS

## 2021-02-14 MED ORDER — SUGAMMADEX SODIUM 500 MG/5ML IV SOLN
INTRAVENOUS | Status: DC | PRN
  Administered 2021-02-14: 200 mg via INTRAVENOUS

## 2021-02-14 MED ORDER — SUCCINYLCHOLINE CHLORIDE 200 MG/10ML IV SOSY
PREFILLED_SYRINGE | INTRAVENOUS | Status: AC
  Filled 2021-02-14: qty 10

## 2021-02-14 MED ORDER — FENTANYL CITRATE (PF) 250 MCG/5ML IJ SOLN
INTRAMUSCULAR | Status: AC
  Filled 2021-02-14: qty 5

## 2021-02-14 MED ORDER — CHLORHEXIDINE GLUCONATE CLOTH 2 % EX PADS
6.0000 | MEDICATED_PAD | Freq: Every day | CUTANEOUS | Status: DC
  Administered 2021-02-14 – 2021-02-18 (×4): 6 via TOPICAL

## 2021-02-14 MED ORDER — NICOTINE 21 MG/24HR TD PT24
21.0000 mg | MEDICATED_PATCH | Freq: Every day | TRANSDERMAL | Status: DC
  Administered 2021-02-15 – 2021-02-18 (×4): 21 mg via TRANSDERMAL
  Filled 2021-02-14 (×6): qty 1

## 2021-02-14 MED ORDER — LIDOCAINE HCL (PF) 2 % IJ SOLN
INTRAMUSCULAR | Status: AC
  Filled 2021-02-14: qty 5

## 2021-02-14 MED ORDER — BUPIVACAINE LIPOSOME 1.3 % IJ SUSP
INTRAMUSCULAR | Status: DC | PRN
  Administered 2021-02-14: 20 mL

## 2021-02-14 MED ORDER — PROPOFOL 10 MG/ML IV BOLUS
INTRAVENOUS | Status: AC
  Filled 2021-02-14: qty 20

## 2021-02-14 MED ORDER — MIDAZOLAM HCL 2 MG/2ML IJ SOLN
INTRAMUSCULAR | Status: AC
  Filled 2021-02-14: qty 2

## 2021-02-14 MED ORDER — SODIUM CHLORIDE 0.9 % IV SOLN: INTRAVENOUS | Status: DC

## 2021-02-14 MED ORDER — BUPIVACAINE LIPOSOME 1.3 % IJ SUSP
INTRAMUSCULAR | Status: AC
  Filled 2021-02-14: qty 20

## 2021-02-14 MED ORDER — 0.9 % SODIUM CHLORIDE (POUR BTL) OPTIME
TOPICAL | Status: DC | PRN
  Administered 2021-02-14 (×3): 1000 mL

## 2021-02-14 MED ORDER — HYDROMORPHONE HCL 1 MG/ML IJ SOLN
0.2500 mg | INTRAMUSCULAR | Status: DC | PRN
  Administered 2021-02-14 (×2): 0.5 mg via INTRAVENOUS
  Filled 2021-02-14: qty 0.5

## 2021-02-14 MED ORDER — ONDANSETRON HCL 4 MG/2ML IJ SOLN
INTRAMUSCULAR | Status: DC | PRN
  Administered 2021-02-14: 4 mg via INTRAVENOUS

## 2021-02-14 MED ORDER — SUCCINYLCHOLINE CHLORIDE 200 MG/10ML IV SOSY
PREFILLED_SYRINGE | INTRAVENOUS | Status: DC | PRN
  Administered 2021-02-14: 160 mg via INTRAVENOUS

## 2021-02-14 MED ORDER — LABETALOL HCL 5 MG/ML IV SOLN
10.0000 mg | INTRAVENOUS | Status: DC | PRN
  Administered 2021-02-14: 10 mg via INTRAVENOUS
  Filled 2021-02-14: qty 4

## 2021-02-14 MED ORDER — DEXAMETHASONE SODIUM PHOSPHATE 4 MG/ML IJ SOLN
INTRAMUSCULAR | Status: AC
  Filled 2021-02-14: qty 2

## 2021-02-14 MED ORDER — OXYCODONE HCL 5 MG PO TABS
5.0000 mg | ORAL_TABLET | ORAL | Status: DC | PRN
  Administered 2021-02-16: 5 mg via ORAL
  Filled 2021-02-14: qty 1

## 2021-02-14 MED ORDER — ROCURONIUM BROMIDE 10 MG/ML (PF) SYRINGE
PREFILLED_SYRINGE | INTRAVENOUS | Status: AC
  Filled 2021-02-14: qty 10

## 2021-02-14 MED ORDER — LORAZEPAM 2 MG/ML IJ SOLN
1.0000 mg | INTRAMUSCULAR | Status: DC | PRN
  Administered 2021-02-14 – 2021-02-16 (×6): 1 mg via INTRAVENOUS
  Filled 2021-02-14 (×6): qty 1

## 2021-02-14 MED ORDER — LIDOCAINE HCL (CARDIAC) PF 100 MG/5ML IV SOSY
PREFILLED_SYRINGE | INTRAVENOUS | Status: DC | PRN
  Administered 2021-02-14: 50 mg via INTRAVENOUS

## 2021-02-14 MED ORDER — ENOXAPARIN SODIUM 40 MG/0.4ML IJ SOSY
40.0000 mg | PREFILLED_SYRINGE | INTRAMUSCULAR | Status: DC
  Administered 2021-02-15 – 2021-02-18 (×4): 40 mg via SUBCUTANEOUS
  Filled 2021-02-14 (×4): qty 0.4

## 2021-02-14 MED ORDER — HYDROMORPHONE HCL 1 MG/ML IJ SOLN: 1.0000 mg | INTRAMUSCULAR | Status: DC | PRN

## 2021-02-14 MED ORDER — ROCURONIUM BROMIDE 100 MG/10ML IV SOLN
INTRAVENOUS | Status: DC | PRN
  Administered 2021-02-14: 50 mg via INTRAVENOUS

## 2021-02-14 MED ORDER — ORAL CARE MOUTH RINSE: 15.0000 mL | Freq: Once | OROMUCOSAL | Status: AC

## 2021-02-14 MED ORDER — METOPROLOL TARTRATE 5 MG/5ML IV SOLN
INTRAVENOUS | Status: AC
  Filled 2021-02-14: qty 5

## 2021-02-14 MED ORDER — POVIDONE-IODINE 10 % OINT PACKET
TOPICAL_OINTMENT | CUTANEOUS | Status: DC | PRN
  Administered 2021-02-14: 1 via TOPICAL

## 2021-02-14 MED ORDER — EPHEDRINE SULFATE 50 MG/ML IJ SOLN
INTRAMUSCULAR | Status: DC | PRN
  Administered 2021-02-14: 5 mg via INTRAVENOUS

## 2021-02-14 SURGICAL SUPPLY — 41 items
BARRIER SKIN 2 3/4 (OSTOMY) IMPLANT
BARRIER SKIN OD2.25 2 3/4 FLNG (OSTOMY) IMPLANT
CLAMP POUCH DRAINAGE QUIET (OSTOMY) IMPLANT
COVER LIGHT HANDLE STERIS (MISCELLANEOUS) ×8 IMPLANT
COVER WAND RF STERILE (DRAPES) ×2 IMPLANT
DRSG OPSITE POSTOP 4X8 (GAUZE/BANDAGES/DRESSINGS) ×1 IMPLANT
ELECT REM PT RETURN 9FT ADLT (ELECTROSURGICAL) ×2
ELECTRODE REM PT RTRN 9FT ADLT (ELECTROSURGICAL) ×1 IMPLANT
GLOVE SURG ENC MOIS LTX SZ6.5 (GLOVE) ×4 IMPLANT
GLOVE SURG SS PI 7.5 STRL IVOR (GLOVE) ×4 IMPLANT
GLOVE SURG UNDER POLY LF SZ6.5 (GLOVE) ×4 IMPLANT
GLOVE SURG UNDER POLY LF SZ7 (GLOVE) ×10 IMPLANT
GOWN STRL REUS W/TWL LRG LVL3 (GOWN DISPOSABLE) ×12 IMPLANT
HANDLE SUCTION POOLE (INSTRUMENTS) IMPLANT
INST SET MAJOR GENERAL (KITS) ×2 IMPLANT
KIT TURNOVER KIT A (KITS) ×2 IMPLANT
LIGASURE IMPACT 36 18CM CVD LR (INSTRUMENTS) ×2 IMPLANT
MANIFOLD NEPTUNE II (INSTRUMENTS) ×2 IMPLANT
NDL HYPO 18GX1.5 BLUNT FILL (NEEDLE) ×1 IMPLANT
NEEDLE HYPO 18GX1.5 BLUNT FILL (NEEDLE) ×2 IMPLANT
NS IRRIG 1000ML POUR BTL (IV SOLUTION) ×5 IMPLANT
PACK COLON (CUSTOM PROCEDURE TRAY) ×2 IMPLANT
PAD ARMBOARD 7.5X6 YLW CONV (MISCELLANEOUS) ×2 IMPLANT
PENCIL SMOKE EVACUATOR COATED (MISCELLANEOUS) ×2 IMPLANT
RELOAD PROXIMATE 75MM BLUE (ENDOMECHANICALS) ×4 IMPLANT
RELOAD STAPLE 75 3.8 BLU REG (ENDOMECHANICALS) IMPLANT
RETRACTOR WND ALEXIS-O 25 LRG (MISCELLANEOUS) IMPLANT
RTRCTR WOUND ALEXIS O 25CM LRG (MISCELLANEOUS) ×2
SPONGE LAP 18X18 RF (DISPOSABLE) ×4 IMPLANT
STAPLER GUN LINEAR PROX 60 (STAPLE) ×2 IMPLANT
STAPLER PROXIMATE 75MM BLUE (STAPLE) ×1 IMPLANT
STAPLER VISISTAT (STAPLE) ×2 IMPLANT
SUCTION POOLE HANDLE (INSTRUMENTS) ×4
SUT CHROMIC 0 SH (SUTURE) IMPLANT
SUT CHROMIC 2 0 SH (SUTURE) ×1 IMPLANT
SUT PDS AB 0 CTX 60 (SUTURE) ×2 IMPLANT
SUT SILK 2 0 (SUTURE)
SUT SILK 2-0 18XBRD TIE 12 (SUTURE) IMPLANT
SUT SILK 3 0 SH CR/8 (SUTURE) ×4 IMPLANT
TRAY FOLEY MTR SLVR 16FR STAT (SET/KITS/TRAYS/PACK) ×2 IMPLANT
YANKAUER SUCT BULB TIP 10FT TU (MISCELLANEOUS) ×3 IMPLANT

## 2021-02-14 NOTE — Anesthesia Procedure Notes (Signed)
Procedure Name: Intubation Date/Time: 02/14/2021 1:26 PM Performed by: Jonna Munro, CRNA Pre-anesthesia Checklist: Patient identified, Patient being monitored, Timeout performed, Emergency Drugs available and Suction available Patient Re-evaluated:Patient Re-evaluated prior to induction Oxygen Delivery Method: Circle System Utilized Preoxygenation: Pre-oxygenation with 100% oxygen Induction Type: IV induction Ventilation: Mask ventilation without difficulty Laryngoscope Size: Mac and 3 Grade View: Grade I Tube type: Oral Tube size: 7.5 mm Number of attempts: 1 Airway Equipment and Method: stylet Placement Confirmation: ETT inserted through vocal cords under direct vision, positive ETCO2 and breath sounds checked- equal and bilateral Secured at: 22 cm Tube secured with: Tape Dental Injury: Teeth and Oropharynx as per pre-operative assessment

## 2021-02-14 NOTE — Progress Notes (Signed)
Pt refused bowel prep, nurse explained that it was needed for surgery, pt took a few sips and still refused. Attempted multiple times during shift and pt still refused.

## 2021-02-14 NOTE — Interval H&P Note (Signed)
History and Physical Interval Note:  02/14/2021 11:54 AM  Marc Jimenez  has presented today for surgery, with the diagnosis of sigmoid volvulus.  The various methods of treatment have been discussed with the patient and family. After consideration of risks, benefits and other options for treatment, the patient has consented to  Procedure(s): PARTIAL COLECTOMY (N/A) as a surgical intervention.  The patient's history has been reviewed, patient examined, no change in status, stable for surgery.  I have reviewed the patient's chart and labs.  Questions were answered to the patient's satisfaction.     Aviva Signs

## 2021-02-14 NOTE — Anesthesia Preprocedure Evaluation (Signed)
Anesthesia Evaluation  Patient identified by MRN, date of birth, ID band Patient awake    Reviewed: Allergy & Precautions, NPO status , Patient's Chart, lab work & pertinent test results  Airway Mallampati: III  TM Distance: >3 FB Neck ROM: Full    Dental  (+) Edentulous Upper, Edentulous Lower   Pulmonary Current Smoker and Patient abstained from smoking.,    Pulmonary exam normal breath sounds clear to auscultation       Cardiovascular Exercise Tolerance: Poor hypertension, Pt. on medications + dysrhythmias Atrial Fibrillation  Rhythm:Irregular Rate:Abnormal - Systolic murmurs, - Diastolic murmurs, - Friction Rub, - Carotid Bruit, - Peripheral Edema and - Systolic Click [id number] 03:15:94 [place] System-AP-300 ROUTINE RECORD 06/04/1951 (76 yr) Male Black Room:A326 Loc:903 Technician: Test ind: Vent. rate 56 BPM PR interval * ms QRS duration 150 ms QT/QTcB 434/418 ms P-R-T axes -82 -54 77 Atrial flutter with variable A-V block Left axis deviation Right bundle branch block Septal infarct , age undetermined T wave abnormality, consider lateral ischemia Abnormal ECG  1. Left ventricular ejection fraction, by estimation, is 60 to 65%. The left ventricle has normal function. The left ventricle has no regional wall motion abnormalities. There is moderate left ventricular hypertrophy. Left ventricular diastolic parameters are indeterminate.  2. Right ventricular systolic function is normal. The right ventricular size is normal. There is normal pulmonary artery systolic pressure. The estimated right ventricular systolic pressure is 58.5 mmHg.  3. Left atrial size was mild to moderately dilated.  4. Right atrial size was mildly dilated.  5. The mitral valve is grossly normal. Mild mitral valve regurgitation.  6. The aortic valve is tricuspid. Aortic valve regurgitation is mild. Aortic regurgitation PHT measures  719 msec.  7. The inferior vena cava is normal in size with greater than 50% respiratory variability, suggesting right atrial pressure of 3 mmHg   Neuro/Psych PSYCHIATRIC DISORDERS    GI/Hepatic Bowel prep,(+)     substance abuse  alcohol use and marijuana use, Sigmoid volvulus    Endo/Other  negative endocrine ROS  Renal/GU Renal InsufficiencyRenal disease (AKI)     Musculoskeletal negative musculoskeletal ROS (+)   Abdominal   Peds  Hematology negative hematology ROS (+)   Anesthesia Other Findings   Reproductive/Obstetrics negative OB ROS                             Anesthesia Physical  Anesthesia Plan  ASA: 4  Anesthesia Plan: General   Post-op Pain Management:    Induction: Intravenous and Rapid sequence  PONV Risk Score and Plan: 2 and 3 and Ondansetron and Dexamethasone  Airway Management Planned: Oral ETT  Additional Equipment:   Intra-op Plan:   Post-operative Plan: Extubation in OR  Informed Consent: I have reviewed the patients History and Physical, chart, labs and discussed the procedure including the risks, benefits and alternatives for the proposed anesthesia with the patient or authorized representative who has indicated his/her understanding and acceptance.     Dental advisory given  Plan Discussed with: CRNA and Surgeon  Anesthesia Plan Comments:         Anesthesia Quick Evaluation

## 2021-02-14 NOTE — OR Nursing (Signed)
Voided 175cc yellow urine

## 2021-02-14 NOTE — Progress Notes (Signed)
51- Dr Charna Elizabeth in to see pt SBP over 100, hr above 100-, MD gave 2 mg iv Metoprolol.  When HR came down noted to be in A flutter, which pt has hx of.  Have given 0.5 mg dilaudid in divided doses  per Dr Charna Elizabeth for bladder spasms. No c/o operative pain.

## 2021-02-14 NOTE — Progress Notes (Signed)
Patient Demographics:    Marc Jimenez, is a 70 y.o. male, DOB - 1951/02/11, ZOX:[id number]  Admit date - 02/10/2021   Admitting Physician Ejiroghene Arlyce Dice, MD  Outpatient Primary MD for the patient is [name], MD  LOS - 4   Chief Complaint  Patient presents with   Constipation        Subjective:    Marc Jimenez today has no fevers, no emesis,  No chest pain,   More restful postop -Left message for his nephew Mr. Hermenia Jimenez  Assessment  & Plan :    Principal Problem:   Sigmoid volvulus ([place]) Active Problems:   Alcohol abuse   HTN (hypertension)   Preoperative cardiovascular examination   Typical atrial flutter [facility])  Brief Summary:- 70 y.o. male with medical history significant for alcohol abuse, narcolepsy, hypertension, heart disease who is a poor historian admitted on 02/10/2021 with recurrent sigmoid volvulus and AKI  A/p 1)Recurrent Sigmoid Colon Volvulus---Had prior colonoscopic reduction in March 2022 -- Patient previously refused surgical intervention -Status post flex sig on 02/12/2021 with reduction of volvulus -Postprocedure KUB shows no acute abnormalities -s/p Partial colectomy by general surgeon on 02/14/21   2)AKI----acute kidney injury --due to dehydration in the setting of poor oral intake due to volvulus and Lasix use Creatinine 1.38 >>1.0 -Overall resolved with hydration - renally adjust medications, avoid nephrotoxic agents / dehydration  / hypotension  3)Etoh Abuse-lorazepam per CIWA protocol continue multivitamin folic acid and thiamine - 4)HTN-IV labetalol as needed for elevated BP, continue to hold Lasix, restarted Norvasc  5)FEN-IV dextrose until oral intake is more reliable  6)Social/Ethics--patient is a poor historian, plan of care discussed with patient and his nephew [name] at bedside -Patient is a full code  7)Persistent atrial flutter  with 4-1 block--- rate appears controlled, CHA2DS2-VASc score is 3 -Not a candidate for anticoagulation due to being noncompliant -Cardiology input appreciated  Disposition/Need for in-Hospital Stay- patient unable to be discharged at this time due to -recurrent sigmoid volvulus requiring surgical intervention, -now awaiting return of bowel function and tolerance of oral intake postoperatively  Status is: Inpatient  Remains inpatient appropriate because: Please see disposition above   Disposition: The patient is from: Home              Anticipated d/c is to: Home              Anticipated d/c date is: 3 days              Patient currently is not medically stable to d/c. Barriers: Not Clinically Stable-   Code Status : -  Code Status: Full Code   Family Communication:    (patient is alert, awake and coherent)  Previously Discussed with Terrilee Files Consults  :  Gen surgery  DVT Prophylaxis  :   - SCDs  enoxaparin (LOVENOX) injection 40 mg Start: 02/15/21 1000 SCD's Start: 02/14/21 1623 SCDs Start: 02/10/21 2309   Lab Results  Component Value Date   PLT 187 02/14/2021    Inpatient Medications  Scheduled Meds:  amLODipine  2.5 mg Oral Daily   [START ON 02/15/2021] enoxaparin (LOVENOX) injection  40 mg Subcutaneous W11B   folic acid  1 mg Oral Daily  multivitamin with minerals  1 tablet Oral Daily   nicotine  21 mg Transdermal Daily   thiamine  100 mg Oral Daily   Or   thiamine  100 mg Intravenous Daily   Continuous Infusions:  sodium chloride 75 mL/hr at 02/14/21 1640   dextrose 5 % and 0.9% NaCl 50 mL/hr at 02/12/21 1949   PRN Meds:.acetaminophen **OR** acetaminophen, HYDROmorphone (DILAUDID) injection, LORazepam, morphine injection, ondansetron **OR** ondansetron (ZOFRAN) IV, oxyCODONE, simethicone    Anti-infectives (From admission, onward)    Start     Dose/Rate Route Frequency Ordered Stop   02/14/21 0600  ertapenem (INVANZ) 1,000 mg in sodium chloride 0.9 %  100 mL IVPB        1 g 200 mL/hr over 30 Minutes Intravenous On call to O.R. 02/13/21 1819 02/14/21 1314   02/13/21 0945  neomycin (MYCIFRADIN) tablet 1,000 mg        1,000 mg Oral Every 8 hours 02/13/21 0855 02/13/21 2048   02/13/21 0945  metroNIDAZOLE (FLAGYL) tablet 500 mg        500 mg Oral Every 8 hours 02/13/21 0855 02/13/21 2048         Objective:   Vitals:   02/14/21 1545 02/14/21 1600 02/14/21 1625 02/14/21 1731  BP: (!) 153/86 (!) 162/86 (!) 145/84 (!) 177/98  Pulse: (!) 51 (!) 56 (!) 52 67  Resp: 12 12 14 20   Temp:   97.7 F (36.5 C) (!) 97.4 F (36.3 C)  TempSrc:    Axillary  SpO2: 94% 100% 100% 100%  Weight:      Height:        Wt Readings from Last 3 Encounters:  02/10/21 62.7 kg  01/09/21 61.2 kg  09/24/19 64.4 kg    Intake/Output Summary (Last 24 hours) at 02/14/2021 1735 Last data filed at 02/14/2021 1536 Gross per 24 hour  Intake 1300 ml  Output 1080 ml  Net 220 ml   Physical Exam  Gen:-Wakes up easily but resting in no apparent distress HEENT:- [place].AT, No sclera icterus Neck-Supple Neck,No JVD,.  Lungs-  CTAB , fair symmetrical air movement CV- S1, S2 normal, regular  Abd-  +ve B.Sounds, Abd Soft, postop wound intact Extremity/Skin:- No  edema, pedal pulses present  Psych-affect is appropriate, oriented x3 Neuro-no new focal deficits, no tremors   Data Review:   Micro Results Recent Results (from the past 240 hour(s))  SARS CORONAVIRUS 2 (TAT 6-24 HRS) Nasopharyngeal Nasopharyngeal Swab     Status: None   Collection Time: 02/10/21  9:24 PM   Specimen: Nasopharyngeal Swab  Result Value Ref Range Status   SARS Coronavirus 2 NEGATIVE NEGATIVE Final    Comment: (NOTE) SARS-CoV-2 target nucleic acids are NOT DETECTED.  The SARS-CoV-2 RNA is generally detectable in upper and lower respiratory specimens during the acute phase of infection. Negative results do not preclude SARS-CoV-2 infection, do not rule out co-infections with other  pathogens, and should not be used as the sole basis for treatment or other patient management decisions. Negative results must be combined with clinical observations, patient history, and epidemiological information. The expected result is Negative.  Fact Sheet for Patients: SugarRoll.be  Fact Sheet for Healthcare Providers: https://www.woods-mathews.com/  This test is not yet approved or cleared by the [place] FDA and  has been authorized for detection and/or diagnosis of SARS-CoV-2 by FDA under an Emergency Use Authorization (EUA). This EUA will remain  in effect (meaning this test can be used) for the duration of the  COVID-19 declaration under Se ction 564(b)(1) of the Act, 21 U.S.C. section 360bbb-3(b)(1), unless the authorization is terminated or revoked sooner.  Performed at [facility], [place] [address] [place], [place] [address]   Surgical pcr screen     Status: None   Collection Time: 02/14/21  1:57 AM   Specimen: Nasal Mucosa; Nasal Swab  Result Value Ref Range Status   MRSA, PCR NEGATIVE NEGATIVE Final   Staphylococcus aureus NEGATIVE NEGATIVE Final    Comment: (NOTE) The Xpert SA Assay (FDA approved for NASAL specimens in patients 29 years of age and older), is one component of a comprehensive surveillance program. It is not intended to diagnose infection nor to guide or monitor treatment. Performed at [facility], [address], [place], [place] [address]     Radiology Reports CT ABDOMEN PELVIS WO CONTRAST  Result Date: 02/10/2021 CLINICAL DATA:  Abdominal pain, constipation EXAM: CT ABDOMEN AND PELVIS WITHOUT CONTRAST TECHNIQUE: Multidetector CT imaging of the abdomen and pelvis was performed following the standard protocol without IV contrast. COMPARISON:  11/06/2020 FINDINGS: Lower chest: Lung bases are essentially clear. Hepatobiliary: Unenhanced liver is unremarkable. Gallbladder is unremarkable. No  intrahepatic or extrahepatic ductal dilatation. Pancreas: Within normal limits. Spleen: Within normal limits. Adrenals/Urinary Tract: 19 mm left adrenal adenoma (series 2/image 19). Right adrenal gland is within normal limits. Two right renal cysts measuring up to 4.0 cm in the right lower pole (series 2/image 30). Left kidney is within normal limits. No renal calculi or hydronephrosis. Mildly thick-walled bladder, although underdistended. Stomach/Bowel: Stomach is within normal limits. No evidence of small bowel obstruction. Dilated sigmoid colon with narrowing in the central mesenteric root (series 2/image 44). This is recurrent but less severe when compared to the prior. Sigmoid colon measures up to 6.2 cm on the current study, without wall thickening or mesenteric edema. Vascular/Lymphatic: No evidence of abdominal aortic aneurysm. Atherosclerotic calcifications of the abdominal aorta and branch vessels. No suspicious abdominopelvic lymphadenopathy. Reproductive: Prostatomegaly, suggesting BPH. Other: No abdominopelvic ascites. Musculoskeletal: Degenerative changes of the visualized thoracolumbar spine. 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 This appearance is recurrent but improved when compared to the prior. Additional ancillary findings as above. Electronically Signed   By: Julian Hy M.D.   On: 02/10/2021 19:39   DG Abd 1 View  Result Date: 02/12/2021 CLINICAL DATA:  Volvulus status post sigmoidoscopy. EXAM: ABDOMEN - 1 VIEW COMPARISON:  CT of February 10, 2021.  Radiograph of November 07, 2020. FINDINGS: Large amount of contrast and stool is seen in what appears to be the right colon. No significant bowel dilatation is noted currently. No abnormal calcifications are noted. IMPRESSION: No significant abnormal bowel dilatation is seen currently. Electronically Signed   By: Marijo Conception M.D.   On: 02/12/2021 14:53   ECHOCARDIOGRAM COMPLETE  Result Date: 02/13/2021     ECHOCARDIOGRAM REPORT   Patient Name:   Marc Jimenez Date of Exam: 02/13/2021 Medical Rec #:  [id number]        Height:       67.0 in Accession #:    [id number]       Weight:       138.2 lb Date of Birth:  April 06, 1951         BSA:          1.728 m Patient Age:    80 years         BP:  148/80 mmHg Patient Gender: M                HR:           61 bpm. Exam Location:  [facility] Procedure: 2D Echo, Cardiac Doppler and Color Doppler Indications:    Atrial Flutter I48.92  History:        Patient has no prior history of Echocardiogram examinations.                 Risk Factors:Hypertension. Alcohol abuse.  Sonographer:    Alvino Chapel RCS Referring Phys: [id number] [place]  1. Left ventricular ejection fraction, by estimation, is 60 to 65%. The left ventricle has normal function. The left ventricle has no regional wall motion abnormalities. There is moderate left ventricular hypertrophy. Left ventricular diastolic parameters are indeterminate.  2. Right ventricular systolic function is normal. The right ventricular size is normal. There is normal pulmonary artery systolic pressure. The estimated right ventricular systolic pressure is 47.0 mmHg.  3. Left atrial size was mild to moderately dilated.  4. Right atrial size was mildly dilated.  5. The mitral valve is grossly normal. Mild mitral valve regurgitation.  6. The aortic valve is tricuspid. Aortic valve regurgitation is mild. Aortic regurgitation PHT measures 719 msec.  7. The inferior vena cava is normal in size with greater than 50% respiratory variability, suggesting right atrial pressure of 3 mmHg. FINDINGS  Left Ventricle: Left ventricular ejection fraction, by estimation, is 60 to 65%. The left ventricle has normal function. The left ventricle has no regional wall motion abnormalities. The left ventricular internal cavity size was normal in size. There is  moderate left ventricular hypertrophy. Left ventricular diastolic parameters are  indeterminate. Right Ventricle: The right ventricular size is normal. No increase in right ventricular wall thickness. Right ventricular systolic function is normal. There is normal pulmonary artery systolic pressure. The tricuspid regurgitant velocity is 2.45 m/s, and  with an assumed right atrial pressure of 3 mmHg, the estimated right ventricular systolic pressure is 96.2 mmHg. Left Atrium: Left atrial size was mild to moderately dilated. Right Atrium: Right atrial size was mildly dilated. Pericardium: There is no evidence of pericardial effusion. Mitral Valve: The mitral valve is grossly normal. There is mild thickening of the mitral valve leaflet(s). Mild mitral valve regurgitation. Tricuspid Valve: The tricuspid valve is grossly normal. Tricuspid valve regurgitation is mild. Aortic Valve: The aortic valve is tricuspid. Aortic valve regurgitation is mild. Aortic regurgitation PHT measures 719 msec. Pulmonic Valve: The pulmonic valve was grossly normal. Pulmonic valve regurgitation is trivial. Aorta: The aortic root is normal in size and structure. Venous: The inferior vena cava is normal in size with greater than 50% respiratory variability, suggesting right atrial pressure of 3 mmHg. IAS/Shunts: No atrial level shunt detected by color flow Doppler.  LEFT VENTRICLE PLAX 2D LVIDd:         4.20 cm  Diastology LVIDs:         2.70 cm  LV e' medial:    8.27 cm/s LV PW:         1.50 cm  LV E/e' medial:  9.3 LV IVS:        1.30 cm  LV e' lateral:   11.40 cm/s LVOT diam:     2.00 cm  LV E/e' lateral: 6.8 LV SV:         53 LV SV Index:   31 LVOT Area:     3.14 cm  RIGHT  VENTRICLE RV S prime:     12.80 cm/s TAPSE (M-mode): 2.0 cm LEFT ATRIUM             Index       RIGHT ATRIUM           Index LA diam:        3.40 cm 1.97 cm/m  RA Area:     21.80 cm LA Vol (A2C):   73.5 ml 42.52 ml/m RA Volume:   63.00 ml  36.45 ml/m LA Vol (A4C):   73.4 ml 42.47 ml/m LA Biplane Vol: 73.4 ml 42.47 ml/m  AORTIC VALVE LVOT Vmax:    86.95 cm/s LVOT Vmean:  55.500 cm/s LVOT VTI:    0.168 m AI PHT:      719 msec  AORTA Ao Root diam: 3.70 cm MITRAL VALVE               TRICUSPID VALVE MV Area (PHT): 4.80 cm    TR Peak grad:   24.0 mmHg MV Decel Time: 158 msec    TR Vmax:        245.00 cm/s MV E velocity: 77.10 cm/s MV A velocity: 55.50 cm/s  SHUNTS MV E/A ratio:  1.39        Systemic VTI:  0.17 m                            Systemic Diam: 2.00 cm Rozann Lesches MD Electronically signed by Rozann Lesches MD Signature Date/Time: 02/13/2021/12:50:35 PM    Final       CBC Recent Labs  Lab 02/10/21 1723 02/11/21 1610 02/13/21 0606 02/14/21 0617  WBC 7.1 5.6 4.7 5.1  HGB 16.2 14.3 13.1 13.4  HCT 49.2 42.3 38.9* 40.1  PLT 192 185 174 187  MCV 95.9 94.0 94.2 94.1  MCH 31.6 31.8 31.7 31.5  MCHC 32.9 33.8 33.7 33.4  RDW 13.6 13.3 13.2 13.2  LYMPHSABS 2.5  --   --   --   MONOABS 0.5  --   --   --   EOSABS 0.1  --   --   --   BASOSABS 0.0  --   --   --     Chemistries  Recent Labs  Lab 02/10/21 1723 02/10/21 2212 02/11/21 0812 02/13/21 0606 02/14/21 0617  NA 134*  --  137 137 137  K 4.3  --  3.9 4.0 3.8  CL 101  --  109 109 108  CO2 25  --  22 24 24   GLUCOSE 104*  --  105* 116* 90  BUN 13  --  10 6* 7*  CREATININE 1.38*  --  1.00 0.90 0.92  CALCIUM 9.4  --  8.7* 8.6* 8.6*  MG  --  2.2  --  1.9  --   AST 22  --   --  15  --   ALT 32  --   --  17  --   ALKPHOS 89  --   --  62  --   BILITOT 0.5  --   --  0.6  --    ------------------------------------------------------------------------------------------------------------------ No results for input(s): CHOL, HDL, LDLCALC, TRIG, CHOLHDL, LDLDIRECT in the last 72 hours.  No results found for: HGBA1C ------------------------------------------------------------------------------------------------------------------ No results for input(s): TSH, T4TOTAL, T3FREE, THYROIDAB in the last 72 hours.  Invalid input(s):  FREET3 ------------------------------------------------------------------------------------------------------------------ No results for input(s): VITAMINB12, FOLATE, FERRITIN, TIBC, IRON, RETICCTPCT in the last 72  hours.  Coagulation profile No results for input(s): INR, PROTIME in the last 168 hours.  No results for input(s): DDIMER in the last 72 hours.  Cardiac Enzymes No results for input(s): CKMB, TROPONINI, MYOGLOBIN in the last 168 hours.  Invalid input(s): CK ------------------------------------------------------------------------------------------------------------------ No results found for: BNP   Roxan Hockey M.D on 02/14/2021 at 5:35 PM  Go to [email or web address] - for contact info  [facility] - Office  [phone]

## 2021-02-14 NOTE — Anesthesia Postprocedure Evaluation (Signed)
Anesthesia Post Note  Patient: Marc Jimenez  Procedure(s) Performed: PARTIAL COLECTOMY (Abdomen)  Patient location during evaluation: PACU Anesthesia Type: General Level of consciousness: awake and alert, oriented and sedated Pain management: pain level controlled Vital Signs Assessment: post-procedure vital signs reviewed and stable Respiratory status: spontaneous breathing and respiratory function stable Cardiovascular status: blood pressure returned to baseline and stable Postop Assessment: no apparent nausea or vomiting Anesthetic complications: no   No notable events documented.   Last Vitals:  Vitals:   02/14/21 1437 02/14/21 1445  BP:  (!) 167/102  Pulse: 65 (!) 50  Resp: 12 (!) 8  Temp:    SpO2: 100% 100%    Last Pain:  Vitals:   02/14/21 1437  TempSrc:   PainSc: 0-No pain                 Araf Clugston C Jabria Loos

## 2021-02-14 NOTE — Care Management Important Message (Signed)
Important Message  Patient Details  Name: Marc Jimenez MRN: [id number] Date of Birth: Mar 16, 1951   Medicare Important Message Given:  Yes     Tommy Medal 02/14/2021, 9:28 AM

## 2021-02-14 NOTE — Transfer of Care (Signed)
Immediate Anesthesia Transfer of Care Note  Patient: Marc Jimenez  Procedure(s) Performed: PARTIAL COLECTOMY (Abdomen)  Patient Location: PACU  Anesthesia Type:General  Level of Consciousness: awake, sedated and drowsy  Airway & Oxygen Therapy: Patient Spontanous Breathing and Patient connected to face mask oxygen  Post-op Assessment: Report given to RN, Post -op Vital signs reviewed and stable and Patient moving all extremities X 4  Post vital signs: Reviewed and stable  Last Vitals:  Vitals Value Taken Time  BP 190/139 02/14/21 1416  Temp    Pulse 101 02/14/21 1418  Resp 14 02/14/21 1418  SpO2 99 % 02/14/21 1418  Vitals shown include unvalidated device data.  Last Pain:  Vitals:   02/14/21 1128  TempSrc: Oral  PainSc: 0-No pain      Patients Stated Pain Goal: 8 (66/06/30 1601)  Complications: No notable events documented.

## 2021-02-14 NOTE — Op Note (Signed)
Patient:  Marc Jimenez  DOB:  September 17, 1950  MRN:  [id number]   Preop Diagnosis: Sigmoid volvulus  Postop Diagnosis: Same  Procedure: Partial colectomy  Surgeon: Aviva Signs, MD  Anes: General endotracheal  Indications: Patient is a 70 year old black male who presents with recurrent sigmoid volvulus.  Has been decompressed twice in the past year by colonoscopy.  He now presents for a partial colectomy.  The risks and benefits of the procedure including bleeding, infection, anastomotic leak, and the possibility of a colostomy were fully explained to the patient, who gave informed consent.  Procedure note: The patient was placed in the supine position.  After induction of general endotracheal anesthesia, the abdomen was prepped and draped using the usual sterile technique with ChloraPrep.  Surgical site confirmation was performed.  A lower midline incision was made from the umbilicus to the suprapubic region.  The peritoneal cavity was entered into without difficulty.  The patient had an elongated and narrow sigmoid colon mesentery.  He was also noted to have a floppy cecum.  It was elected to proceed with a sigmoid colectomy.  A GIA stapler was placed across the proximal sigmoid colon and fired.  This was likewise done across the distal sigmoid colon.  Care was taken to avoid the left ureter.  The mesentery was then divided using an Enseal.  A side to side colon anastomosis was performed using a GIA 75 stapler.  The colotomy was closed using a TA 60 stapler.  The staple line was bolstered using 3-0 silk Lembert sutures.  The mesenteric defect was closed using a 2-0 Chromic Gut running suture.  The bowel was returned into the abdominal cavity in an orderly fashion.  The abdominal cavity was copiously irrigated with normal saline.  All operating personnel then changed their gown and gloves.  A new set up was used for closure.  The fascia was reapproximated using a looped 0 PDS running suture.  Of  note was the fact that there was mesh placed around the umbilicus.  No adhesions of bowel was noted to the mesh.  Exparel was instilled into the surrounding wound.  The skin was closed using staples.  Betadine ointment and a dry sterile dressing were applied.  All tape and needle counts were correct at the end of the procedure.  The patient was extubated in the operating room and transferred to PACU in stable condition.  Complications: None  EBL: 50 cc  Specimen: Sigmoid colon

## 2021-02-15 LAB — BASIC METABOLIC PANEL
Anion gap: 7 (ref 5–15)
BUN: 9 mg/dL (ref 8–23)
CO2: 26 mmol/L (ref 22–32)
Calcium: 8.8 mg/dL — ABNORMAL LOW (ref 8.9–10.3)
Chloride: 101 mmol/L (ref 98–111)
Creatinine, Ser: 0.97 mg/dL (ref 0.61–1.24)
GFR, Estimated: >60 mL/min (ref >60)
Glucose, Bld: 128 mg/dL — ABNORMAL HIGH (ref 70–99)
Potassium: 4.1 mmol/L (ref 3.5–5.1)
Sodium: 134 mmol/L — ABNORMAL LOW (ref 135–145)

## 2021-02-15 LAB — CBC
HCT: 39.5 % (ref 39.0–52.0)
Hemoglobin: 13.5 g/dL (ref 13.0–17.0)
MCH: 31.6 pg (ref 26.0–34.0)
MCHC: 34.2 g/dL (ref 30.0–36.0)
MCV: 92.5 fL (ref 80.0–100.0)
Platelets: 205 10*3/uL (ref 150–400)
RBC: 4.27 MIL/uL (ref 4.22–5.81)
RDW: 13 % (ref 11.5–15.5)
WBC: 11 10*3/uL — ABNORMAL HIGH (ref 4.0–10.5)
nRBC: 0 % (ref 0.0–0.2)

## 2021-02-15 LAB — MAGNESIUM: Magnesium: 1.7 mg/dL (ref 1.7–2.4)

## 2021-02-15 LAB — PHOSPHORUS: Phosphorus: 3.2 mg/dL (ref 2.5–4.6)

## 2021-02-15 NOTE — Progress Notes (Signed)
At 1621 this nurse went to check on patient. Patient was sitting on floor in front of recliner patient had yellow socks on bed alarm was not set prior to patient getting out of bed . MD made aware and patients family member made aware. Patient doesn't appear in any distress or discomfort. This nurse and Santa Lighter RN got patient back in bed. Bed alarm on bed in lowest postion, call bell in reach, yellow socks on. Vital signs B/P 169/103 heart rate 108, 100% RA.

## 2021-02-15 NOTE — Progress Notes (Signed)
Patient confused, wants to leave.  Patient states he feels better and is ready to go.  Explained to patient that he was not ready for disharge yet.  Patient still angry and upset.  Called patient nephew on the phone so patient could speak to him and other family member [name].

## 2021-02-15 NOTE — Progress Notes (Signed)
Subjective:  CC: Marc Jimenez is a 70 y.o. male  Hospital stay day 5, 1 Day Post-Op sigmoidectomy for sigmoid volvulus  HPI: Report of patient confusion and agitation.  He received ativan shortly before exam so could not explicitly answer questions.  Per report, patient has been wandering halls and pulled off midline dressing.  ROS:  Unable to obtain secondary to patient status.  Objective:   Temp:  [97.4 F (36.3 C)-98.2 F (36.8 C)] 98 F (36.7 C) (06/11 0712) Pulse Rate:  [44-103] 74 (06/11 0712) Resp:  [8-20] 16 (06/11 0712) BP: (144-200)/(73-139) 163/73 (06/11 0712) SpO2:  [94 %-100 %] 94 % (06/11 0712)     Height: 5\' 7"  (170.2 cm) Weight: 62.7 kg BMI (Calculated): 21.64   Intake/Output this shift:   Intake/Output Summary (Last 24 hours) at 02/15/2021 1027 Last data filed at 02/15/2021 0900 Gross per 24 hour  Intake 1794.39 ml  Output 2600 ml  Net -805.61 ml    Constitutional :  appears stated age and no distress  Respiratory:  clear to auscultation bilaterally  Cardiovascular:  regular rate and rhythm  Gastrointestinal: Soft, no guarding, slight tympany, midline staples intact but seroma/hematoma seems to be forming immediately deep to it  no erythema, induration, drainage to indicate infection .   Skin: Cool and moist. Staples intact  Psychiatric: Normal affect, non-agitated, not confused       LABS:  CMP Latest Ref Rng & Units 02/15/2021 02/14/2021 02/13/2021  Glucose 70 - 99 mg/dL 128(H) 90 116(H)  BUN 8 - 23 mg/dL 9 7(L) 6(L)  Creatinine 0.61 - 1.24 mg/dL 0.97 0.92 0.90  Sodium 135 - 145 mmol/L 134(L) 137 137  Potassium 3.5 - 5.1 mmol/L 4.1 3.8 4.0  Chloride 98 - 111 mmol/L 101 108 109  CO2 22 - 32 mmol/L 26 24 24   Calcium 8.9 - 10.3 mg/dL 8.8(L) 8.6(L) 8.6(L)  Total Protein 6.5 - 8.1 g/dL - - 5.8(L)  Total Bilirubin 0.3 - 1.2 mg/dL - - 0.6  Alkaline Phos 38 - 126 U/L - - 62  AST 15 - 41 U/L - - 15  ALT 0 - 44 U/L - - 17   CBC Latest Ref Rng & Units  02/15/2021 02/14/2021 02/13/2021  WBC 4.0 - 10.5 K/uL 11.0(H) 5.1 4.7  Hemoglobin 13.0 - 17.0 g/dL 13.5 13.4 13.1  Hematocrit 39.0 - 52.0 % 39.5 40.1 38.9(L)  Platelets 150 - 400 K/uL 205 187 174    RADS: N/a Assessment:   POD#1 sigmoidectomy for sigmoid volvulus.  Will continue clears until BM.  Encouraged family to provide support and constant monitoring secondary to this mental status.  Pt is high risk of perioperative complications such as wound infection, dehiscence, anastamotic leak due to inability to cooperate with recommendations.  Advised RN to re-dress staple line again.  Plan discussed with team and all in agreement

## 2021-02-15 NOTE — Progress Notes (Signed)
Patient family to bedside.

## 2021-02-15 NOTE — Progress Notes (Signed)
Patient Demographics:    Marc Jimenez, is a 70 y.o. male, DOB - 1950/12/24, VBT:[id number]  Admit date - 02/10/2021   Admitting Physician Ejiroghene Arlyce Dice, MD  Outpatient Primary MD for the patient is [name], MD  LOS - 5   Chief Complaint  Patient presents with   Constipation        Subjective:    Marc Jimenez today has no fevers, no emesis,  No chest pain,   nephew [name], his sisters x 4 at bedside---trying to calm him down -- Patient pulled out this postop surgical dressing -Gen surgeon [name] also at bedside, -Trying to leave the hospital -Using colorful language at times  Assessment  & Plan :    Principal Problem:   Sigmoid volvulus ([place]) Active Problems:   Alcohol abuse   HTN (hypertension)   Preoperative cardiovascular examination   Typical atrial flutter [facility])  Brief Summary:- 70 y.o. male with medical history significant for alcohol abuse, narcolepsy, hypertension, heart disease who is a poor historian admitted on 02/10/2021 with recurrent sigmoid volvulus and AKI  A/p 1)Recurrent Sigmoid Colon Volvulus---Had prior colonoscopic reduction in March 2022 -- Patient previously refused surgical intervention -Status post flex sig on 02/12/2021 with reduction of volvulus -Postprocedure KUB shows no acute abnormalities -s/p Partial colectomy by general surgeon on 02/14/21 -Discussed with [name] on 02/15/21--continue clear liquid diet for now until flatus and BM -Patient trying to leave the hospital please see subjective section above   2)AKI----acute kidney injury --due to dehydration in the setting of poor oral intake due to volvulus and Lasix use Creatinine 1.38 >>0.9 -Overall resolved with hydration - renally adjust medications, avoid nephrotoxic agents / dehydration  / hypotension  3)Etoh Abuse-lorazepam per CIWA protocol continue multivitamin folic  acid and thiamine - 4)HTN-IV labetalol as needed for elevated BP, continue to hold Lasix, restarted Norvasc  5)FEN-IV dextrose until oral intake is more reliable  6)Social/Ethics--patient is a poor historian, plan of care discussed with patient and his nephew Marc Jimenez and patient's sisters at bedside -Patient is a full code -Patient is not very cooperative  7)Persistent atrial flutter with 4-1 block--- rate appears controlled, CHA2DS2-VASc score is 3 -Not a candidate for anticoagulation due to being noncompliant -Cardiology input appreciated  8)Generalized Weakness/Ambulatory dysfunction--- at risk of falling  Disposition/Need for in-Hospital Stay- patient unable to be discharged at this time due to -recurrent sigmoid volvulus requiring surgical intervention, -now awaiting return of bowel function and tolerance of oral intake postoperatively -Patient trying to leave AMA  Status is: Inpatient  Remains inpatient appropriate because: Please see disposition above   Disposition: The patient is from: Home              Anticipated d/c is to: Home              Anticipated d/c date is: 3 days              Patient currently is not medically stable to d/c. Barriers: Not Clinically Stable-   Code Status : -  Code Status: Full Code   Family Communication:    (patient is alert, awake and coherent)  Previously Discussed with Terrilee Files and sisters at bedside Consults  :  Gen  surgery  DVT Prophylaxis  :   - SCDs  enoxaparin (LOVENOX) injection 40 mg Start: 02/15/21 1000 SCD's Start: 02/14/21 1623 SCDs Start: 02/10/21 2309   Lab Results  Component Value Date   PLT 205 02/15/2021    Inpatient Medications  Scheduled Meds:  amLODipine  2.5 mg Oral Daily   Chlorhexidine Gluconate Cloth  6 each Topical Daily   enoxaparin (LOVENOX) injection  40 mg Subcutaneous Q46N   folic acid  1 mg Oral Daily   multivitamin with minerals  1 tablet Oral Daily   nicotine  21 mg Transdermal Daily    thiamine  100 mg Oral Daily   Or   thiamine  100 mg Intravenous Daily   Continuous Infusions:  sodium chloride Stopped (02/14/21 2142)   PRN Meds:.acetaminophen **OR** acetaminophen, HYDROmorphone (DILAUDID) injection, labetalol, LORazepam, ondansetron **OR** ondansetron (ZOFRAN) IV, oxyCODONE, simethicone    Anti-infectives (From admission, onward)    Start     Dose/Rate Route Frequency Ordered Stop   02/14/21 0600  ertapenem (INVANZ) 1,000 mg in sodium chloride 0.9 % 100 mL IVPB        1 g 200 mL/hr over 30 Minutes Intravenous On call to O.R. 02/13/21 1819 02/14/21 2143   02/13/21 0945  neomycin (MYCIFRADIN) tablet 1,000 mg        1,000 mg Oral Every 8 hours 02/13/21 0855 02/13/21 2048   02/13/21 0945  metroNIDAZOLE (FLAGYL) tablet 500 mg        500 mg Oral Every 8 hours 02/13/21 0855 02/13/21 2048         Objective:   Vitals:   02/15/21 0343 02/15/21 0712 02/15/21 1351 02/15/21 1621  BP: (!) 161/99 (!) 163/73 (!) 164/95 (!) 169/103  Pulse: 87 74 71 (!) 108  Resp: 14 16 16 18   Temp:  98 F (36.7 C)    TempSrc:  Oral    SpO2: 98% 94%  100%  Weight:      Height:        Wt Readings from Last 3 Encounters:  02/10/21 62.7 kg  01/09/21 61.2 kg  09/24/19 64.4 kg    Intake/Output Summary (Last 24 hours) at 02/15/2021 1827 Last data filed at 02/15/2021 1300 Gross per 24 hour  Intake 734.39 ml  Output 1950 ml  Net -1215.61 ml   Physical Exam  Gen:-Not cooperative agitated at times  HEENT:- [place].AT, No sclera icterus Neck-Supple Neck,No JVD,.  Lungs-  CTAB , fair symmetrical air movement CV- S1, S2 normal, regular  Abd-  +ve B.Sounds, Abd Soft, postop wound intact--patient put dressing off dressing has been reapplied--slight erythema noted Extremity/Skin:- No  edema, pedal pulses present  Psych-affect is angry and agitated, forgetful, disoriented from time to time Neuro-generalized weakness, no new focal deficits, no tremors   Data Review:   Micro Results Recent  Results (from the past 240 hour(s))  SARS CORONAVIRUS 2 (TAT 6-24 HRS) Nasopharyngeal Nasopharyngeal Swab     Status: None   Collection Time: 02/10/21  9:24 PM   Specimen: Nasopharyngeal Swab  Result Value Ref Range Status   SARS Coronavirus 2 NEGATIVE NEGATIVE Final    Comment: (NOTE) SARS-CoV-2 target nucleic acids are NOT DETECTED.  The SARS-CoV-2 RNA is generally detectable in upper and lower respiratory specimens during the acute phase of infection. Negative results do not preclude SARS-CoV-2 infection, do not rule out co-infections with other pathogens, and should not be used as the sole basis for treatment or other patient management decisions. Negative results must be  combined with clinical observations, patient history, and epidemiological information. The expected result is Negative.  Fact Sheet for Patients: SugarRoll.be  Fact Sheet for Healthcare Providers: https://www.woods-mathews.com/  This test is not yet approved or cleared by the [place] FDA and  has been authorized for detection and/or diagnosis of SARS-CoV-2 by FDA under an Emergency Use Authorization (EUA). This EUA will remain  in effect (meaning this test can be used) for the duration of the COVID-19 declaration under Se ction 564(b)(1) of the Act, 21 U.S.C. section 360bbb-3(b)(1), unless the authorization is terminated or revoked sooner.  Performed at [facility], [place] [address], [place], [place] [address]   Surgical pcr screen     Status: None   Collection Time: 02/14/21  1:57 AM   Specimen: Nasal Mucosa; Nasal Swab  Result Value Ref Range Status   MRSA, PCR NEGATIVE NEGATIVE Final   Staphylococcus aureus NEGATIVE NEGATIVE Final    Comment: (NOTE) The Xpert SA Assay (FDA approved for NASAL specimens in patients 14 years of age and older), is one component of a comprehensive surveillance program. It is not intended to diagnose infection nor  to guide or monitor treatment. Performed at [facility], [address] [place], [place] [address]     Radiology Reports CT ABDOMEN PELVIS WO CONTRAST  Result Date: 02/10/2021 CLINICAL DATA:  Abdominal pain, constipation EXAM: CT ABDOMEN AND PELVIS WITHOUT CONTRAST TECHNIQUE: Multidetector CT imaging of the abdomen and pelvis was performed following the standard protocol without IV contrast. COMPARISON:  11/06/2020 FINDINGS: Lower chest: Lung bases are essentially clear. Hepatobiliary: Unenhanced liver is unremarkable. Gallbladder is unremarkable. No intrahepatic or extrahepatic ductal dilatation. Pancreas: Within normal limits. Spleen: Within normal limits. Adrenals/Urinary Tract: 19 mm left adrenal adenoma (series 2/image 19). Right adrenal gland is within normal limits. Two right renal cysts measuring up to 4.0 cm in the right lower pole (series 2/image 30). Left kidney is within normal limits. No renal calculi or hydronephrosis. Mildly thick-walled bladder, although underdistended. Stomach/Bowel: Stomach is within normal limits. No evidence of small bowel obstruction. Dilated sigmoid colon with narrowing in the central mesenteric root (series 2/image 44). This is recurrent but less severe when compared to the prior. Sigmoid colon measures up to 6.2 cm on the current study, without wall thickening or mesenteric edema. Vascular/Lymphatic: No evidence of abdominal aortic aneurysm. Atherosclerotic calcifications of the abdominal aorta and branch vessels. No suspicious abdominopelvic lymphadenopathy. Reproductive: Prostatomegaly, suggesting BPH. Other: No abdominopelvic ascites. Musculoskeletal: Degenerative changes of the visualized thoracolumbar spine. 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 This appearance is recurrent but improved when compared to the prior. Additional ancillary findings as above. Electronically Signed   By: Julian Hy M.D.   On: 02/10/2021 19:39    DG Abd 1 View  Result Date: 02/12/2021 CLINICAL DATA:  Volvulus status post sigmoidoscopy. EXAM: ABDOMEN - 1 VIEW COMPARISON:  CT of February 10, 2021.  Radiograph of November 07, 2020. FINDINGS: Large amount of contrast and stool is seen in what appears to be the right colon. No significant bowel dilatation is noted currently. No abnormal calcifications are noted. IMPRESSION: No significant abnormal bowel dilatation is seen currently. Electronically Signed   By: Marijo Conception M.D.   On: 02/12/2021 14:53   ECHOCARDIOGRAM COMPLETE  Result Date: 02/13/2021    ECHOCARDIOGRAM REPORT   Patient Name:   Marc Jimenez Date of Exam: 02/13/2021 Medical Rec #:  [id number]        Height:  67.0 in Accession #:    [id number]       Weight:       138.2 lb Date of Birth:  Nov 23, 1950         BSA:          1.728 m Patient Age:    73 years         BP:           148/80 mmHg Patient Gender: M                HR:           61 bpm. Exam Location:  [facility] Procedure: 2D Echo, Cardiac Doppler and Color Doppler Indications:    Atrial Flutter I48.92  History:        Patient has no prior history of Echocardiogram examinations.                 Risk Factors:Hypertension. Alcohol abuse.  Sonographer:    Alvino Chapel RCS Referring Phys: [id number] Villan[place]  1. Left ventricular ejection fraction, by estimation, is 60 to 65%. The left ventricle has normal function. The left ventricle has no regional wall motion abnormalities. There is moderate left ventricular hypertrophy. Left ventricular diastolic parameters are indeterminate.  2. Right ventricular systolic function is normal. The right ventricular size is normal. There is normal pulmonary artery systolic pressure. The estimated right ventricular systolic pressure is 69.6 mmHg.  3. Left atrial size was mild to moderately dilated.  4. Right atrial size was mildly dilated.  5. The mitral valve is grossly normal. Mild mitral valve regurgitation.  6. The aortic valve is tricuspid.  Aortic valve regurgitation is mild. Aortic regurgitation PHT measures 719 msec.  7. The inferior vena cava is normal in size with greater than 50% respiratory variability, suggesting right atrial pressure of 3 mmHg. FINDINGS  Left Ventricle: Left ventricular ejection fraction, by estimation, is 60 to 65%. The left ventricle has normal function. The left ventricle has no regional wall motion abnormalities. The left ventricular internal cavity size was normal in size. There is  moderate left ventricular hypertrophy. Left ventricular diastolic parameters are indeterminate. Right Ventricle: The right ventricular size is normal. No increase in right ventricular wall thickness. Right ventricular systolic function is normal. There is normal pulmonary artery systolic pressure. The tricuspid regurgitant velocity is 2.45 m/s, and  with an assumed right atrial pressure of 3 mmHg, the estimated right ventricular systolic pressure is 29.5 mmHg. Left Atrium: Left atrial size was mild to moderately dilated. Right Atrium: Right atrial size was mildly dilated. Pericardium: There is no evidence of pericardial effusion. Mitral Valve: The mitral valve is grossly normal. There is mild thickening of the mitral valve leaflet(s). Mild mitral valve regurgitation. Tricuspid Valve: The tricuspid valve is grossly normal. Tricuspid valve regurgitation is mild. Aortic Valve: The aortic valve is tricuspid. Aortic valve regurgitation is mild. Aortic regurgitation PHT measures 719 msec. Pulmonic Valve: The pulmonic valve was grossly normal. Pulmonic valve regurgitation is trivial. Aorta: The aortic root is normal in size and structure. Venous: The inferior vena cava is normal in size with greater than 50% respiratory variability, suggesting right atrial pressure of 3 mmHg. IAS/Shunts: No atrial level shunt detected by color flow Doppler.  LEFT VENTRICLE PLAX 2D LVIDd:         4.20 cm  Diastology LVIDs:         2.70 cm  LV e' medial:    8.27 cm/s LV  PW:  1.50 cm  LV E/e' medial:  9.3 LV IVS:        1.30 cm  LV e' lateral:   11.40 cm/s LVOT diam:     2.00 cm  LV E/e' lateral: 6.8 LV SV:         53 LV SV Index:   31 LVOT Area:     3.14 cm  RIGHT VENTRICLE RV S prime:     12.80 cm/s TAPSE (M-mode): 2.0 cm LEFT ATRIUM             Index       RIGHT ATRIUM           Index LA diam:        3.40 cm 1.97 cm/m  RA Area:     21.80 cm LA Vol (A2C):   73.5 ml 42.52 ml/m RA Volume:   63.00 ml  36.45 ml/m LA Vol (A4C):   73.4 ml 42.47 ml/m LA Biplane Vol: 73.4 ml 42.47 ml/m  AORTIC VALVE LVOT Vmax:   86.95 cm/s LVOT Vmean:  55.500 cm/s LVOT VTI:    0.168 m AI PHT:      719 msec  AORTA Ao Root diam: 3.70 cm MITRAL VALVE               TRICUSPID VALVE MV Area (PHT): 4.80 cm    TR Peak grad:   24.0 mmHg MV Decel Time: 158 msec    TR Vmax:        245.00 cm/s MV E velocity: 77.10 cm/s MV A velocity: 55.50 cm/s  SHUNTS MV E/A ratio:  1.39        Systemic VTI:  0.17 m                            Systemic Diam: 2.00 cm Rozann Lesches MD Electronically signed by Rozann Lesches MD Signature Date/Time: 02/13/2021/12:50:35 PM    Final       CBC Recent Labs  Lab 02/10/21 1723 02/11/21 1610 02/13/21 0606 02/14/21 0617 02/15/21 0424  WBC 7.1 5.6 4.7 5.1 11.0*  HGB 16.2 14.3 13.1 13.4 13.5  HCT 49.2 42.3 38.9* 40.1 39.5  PLT 192 185 174 187 205  MCV 95.9 94.0 94.2 94.1 92.5  MCH 31.6 31.8 31.7 31.5 31.6  MCHC 32.9 33.8 33.7 33.4 34.2  RDW 13.6 13.3 13.2 13.2 13.0  LYMPHSABS 2.5  --   --   --   --   MONOABS 0.5  --   --   --   --   EOSABS 0.1  --   --   --   --   BASOSABS 0.0  --   --   --   --     Chemistries  Recent Labs  Lab 02/10/21 1723 02/10/21 2212 02/11/21 0812 02/13/21 0606 02/14/21 0617 02/15/21 0424  NA 134*  --  137 137 137 134*  K 4.3  --  3.9 4.0 3.8 4.1  CL 101  --  109 109 108 101  CO2 25  --  22 24 24 26   GLUCOSE 104*  --  105* 116* 90 128*  BUN 13  --  10 6* 7* 9  CREATININE 1.38*  --  1.00 0.90 0.92 0.97  CALCIUM 9.4  --   8.7* 8.6* 8.6* 8.8*  MG  --  2.2  --  1.9  --  1.7  AST 22  --   --  15  --   --  ALT 32  --   --  17  --   --   ALKPHOS 89  --   --  62  --   --   BILITOT 0.5  --   --  0.6  --   --    ------------------------------------------------------------------------------------------------------------------ No results for input(s): CHOL, HDL, LDLCALC, TRIG, CHOLHDL, LDLDIRECT in the last 72 hours.  No results found for: HGBA1C ------------------------------------------------------------------------------------------------------------------ No results for input(s): TSH, T4TOTAL, T3FREE, THYROIDAB in the last 72 hours.  Invalid input(s): FREET3 ------------------------------------------------------------------------------------------------------------------ No results for input(s): VITAMINB12, FOLATE, FERRITIN, TIBC, IRON, RETICCTPCT in the last 72 hours.  Coagulation profile No results for input(s): INR, PROTIME in the last 168 hours.  No results for input(s): DDIMER in the last 72 hours.  Cardiac Enzymes No results for input(s): CKMB, TROPONINI, MYOGLOBIN in the last 168 hours.  Invalid input(s): CK ------------------------------------------------------------------------------------------------------------------ No results found for: BNP   Roxan Hockey M.D on 02/15/2021 at 6:27 PM  Go to [email or web address] - for contact info  [facility] - Office  [phone]

## 2021-02-16 DIAGNOSIS — R410 Disorientation, unspecified: Secondary | ICD-10-CM

## 2021-02-16 MED ORDER — QUETIAPINE FUMARATE 25 MG PO TABS
12.5000 mg | ORAL_TABLET | Freq: Every day | ORAL | Status: DC
  Administered 2021-02-16 – 2021-02-17 (×2): 12.5 mg via ORAL
  Filled 2021-02-16 (×2): qty 1

## 2021-02-16 MED ORDER — SODIUM CHLORIDE 0.9 % IV SOLN: INTRAVENOUS | Status: DC

## 2021-02-16 NOTE — Progress Notes (Signed)
Marc Jimenez, came to visit patient shortly after speaking to patient over the phone. Patient had received dose of ativan and was resting again. Nephew stated that he would like to make sure ativan is given every 4 hours and patient is kept "sedated" so that he can rest and heal. Stated if needed we can call him anytime to speak to patient or he will come up and see patient.

## 2021-02-16 NOTE — Progress Notes (Signed)
Patient Demographics:    Marc Jimenez, is a 70 y.o. male, DOB - 1950/10/03, UEK:[id number]  Admit date - 02/10/2021   Admitting Physician Ejiroghene Arlyce Dice, MD  Outpatient Primary MD for the patient is [name], MD  LOS - 6   Chief Complaint  Patient presents with   Constipation        Subjective:    Marc Jimenez no fever, chest pain, no nausea vomiting currently.  Patient passing gas but no bowel movement.  WBCs are slightly elevated; tolerating clear liquid diet.  Continue to experience intermittent episode of agitation and hallucinationsat home .   Assessment  & Plan :    Principal Problem:   Sigmoid volvulus ([place]) Active Problems:   Alcohol abuse   HTN (hypertension)   Preoperative cardiovascular examination   Typical atrial flutter [facility] ([facility]))  Brief Summary:- 70 y.o. male with medical history significant for alcohol abuse, narcolepsy, hypertension, heart disease who is a poor historian admitted on 02/10/2021 with recurrent sigmoid volvulus and AKI  A/p 1)Recurrent Sigmoid Colon Volvulus---Had prior colonoscopic reduction in March 2022 -- Patient previously refused surgical intervention -Status post flex sig on 02/12/2021 with reduction of volvulus -Postprocedure KUB shows no acute abnormalities -s/p Partial colectomy by general surgeon on 02/14/21 -Discussed with [name] on 02/16/21--continue clear liquid diet for now until [facility]; patient passing flatus. -Patient trying to leave the hospital at times; also with concerns for hospital acquired delirium.  2)AKI----acute kidney injury --due to dehydration in the setting of poor oral intake due to volvulus and Lasix use Creatinine 1.38 >>0.9 -Overall resolved with hydration -renally adjust medications, avoid nephrotoxic agents / dehydration  / hypotension -repeat BMET in am  3)Etoh Abuse-lorazepam per CIWA protocol continue multivitamin  folic acid and thiamine -currently no demonstrating active withdrawal.  4)HTN-IV labetalol as needed for elevated BP, continue to hold Lasix, restarted on Norvasc  5)FEN-IV dextrose until oral intake is more reliable. -per surgery advanced to [facility]D.  6)Social/Ethics/hospital acquired delirium--patient is a poor historian, intermittent episodes of agitation and hallucinations reported. -Patient is a full code -Patient is not very cooperative -conti ue PRN ativan and start low dose of daily seroquel.  7)Persistent atrial flutter with 4-1 block--- rate appears controlled, CHA2DS2-VASc score is 3 -Not a candidate for anticoagulation due to being noncompliant. -Cardiology input appreciated  8)Generalized Weakness/Ambulatory dysfunction--- at risk of falling. -will ask PT to see patient.  Disposition/Need for in-Hospital Stay- patient unable to be discharged at this time due to -recurrent sigmoid volvulus requiring surgical intervention, -now awaiting return of bowel function and tolerance of oral intake postoperatively prior to discharge. -Patient trying to leave AMA  Status is: Inpatient  Remains inpatient appropriate because: Please see disposition above   Disposition: The patient is from: Home              Anticipated d/c is to: Home              Anticipated d/c date is: 3 days              Patient currently is not medically stable to d/c. Barriers: Not Clinically Stable-   Code Status : -  Code Status: Full Code   Family Communication:    no family  at bedside.  Consults  :  Gen surgery  DVT Prophylaxis  :   - SCDs  enoxaparin (LOVENOX) injection 40 mg Start: 02/15/21 1000 SCD's Start: 02/14/21 1623 SCDs Start: 02/10/21 2309   Lab Results  Component Value Date   PLT 205 02/15/2021    Inpatient Medications  Scheduled Meds:  amLODipine  2.5 mg Oral Daily   Chlorhexidine Gluconate Cloth  6 each Topical Daily   enoxaparin (LOVENOX) injection  40 mg Subcutaneous N56O    folic acid  1 mg Oral Daily   multivitamin with minerals  1 tablet Oral Daily   nicotine  21 mg Transdermal Daily   QUEtiapine  12.5 mg Oral QHS   thiamine  100 mg Oral Daily   Or   thiamine  100 mg Intravenous Daily   Continuous Infusions:  sodium chloride     PRN Meds:.acetaminophen **OR** acetaminophen, HYDROmorphone (DILAUDID) injection, labetalol, LORazepam, ondansetron **OR** ondansetron (ZOFRAN) IV, oxyCODONE, simethicone   Anti-infectives (From admission, onward)    Start     Dose/Rate Route Frequency Ordered Stop   02/14/21 0600  ertapenem (INVANZ) 1,000 mg in sodium chloride 0.9 % 100 mL IVPB        1 g 200 mL/hr over 30 Minutes Intravenous On call to O.R. 02/13/21 1819 02/14/21 2143   02/13/21 0945  neomycin (MYCIFRADIN) tablet 1,000 mg        1,000 mg Oral Every 8 hours 02/13/21 0855 02/13/21 2048   02/13/21 0945  metroNIDAZOLE (FLAGYL) tablet 500 mg        500 mg Oral Every 8 hours 02/13/21 0855 02/13/21 2048         Objective:   Vitals:   02/15/21 1621 02/15/21 2056 02/16/21 0448 02/16/21 1402  BP: (!) 169/103 (!) 148/89 (!) 144/89 124/88  Pulse: (!) 108 73 78 83  Resp: 18 17 16    Temp:   98.5 F (36.9 C) 98.3 F (36.8 C)  TempSrc:    Oral  SpO2: 100% 99% 98% 99%  Weight:      Height:        Wt Readings from Last 3 Encounters:  02/10/21 62.7 kg  01/09/21 61.2 kg  09/24/19 64.4 kg    Intake/Output Summary (Last 24 hours) at 02/16/2021 1642 Last data filed at 02/16/2021 0842 Gross per 24 hour  Intake 340 ml  Output --  Net 340 ml   Physical Exam General exam: Alert, awake, oriented x 1; intermittently following commands; Per nursing staff confused and agitated at times (especially at nighttime).  No fever, no chest pain, no nausea vomiting.Marland Kitchen Respiratory system: Good air movement bilaterally, no using accessory muscles. Cardiovascular system: RRR. No murmurs, rubs, gallops. Gastrointestinal system: Abdomen is soft, no guarding, midline staples  intact, no drainage, decreased bowel sounds appreciated on exam.  Slight tympany; mild distention. Central nervous system: No focal neurological deficits. Extremities: No cyanosis, no clubbing.  Skin: No petechiae. Psychiatry: Stable mood currently.   Data Review:   Micro Results Recent Results (from the past 240 hour(s))  SARS CORONAVIRUS 2 (TAT 6-24 HRS) Nasopharyngeal Nasopharyngeal Swab     Status: None   Collection Time: 02/10/21  9:24 PM   Specimen: Nasopharyngeal Swab  Result Value Ref Range Status   SARS Coronavirus 2 NEGATIVE NEGATIVE Final    Comment: (NOTE) SARS-CoV-2 target nucleic acids are NOT DETECTED.  The SARS-CoV-2 RNA is generally detectable in upper and lower respiratory specimens during the acute phase of infection. Negative results do not preclude  SARS-CoV-2 infection, do not rule out co-infections with other pathogens, and should not be used as the sole basis for treatment or other patient management decisions. Negative results must be combined with clinical observations, patient history, and epidemiological information. The expected result is Negative.  Fact Sheet for Patients: SugarRoll.be  Fact Sheet for Healthcare Providers: https://www.woods-mathews.com/  This test is not yet approved or cleared by the [place] FDA and  has been authorized for detection and/or diagnosis of SARS-CoV-2 by FDA under an Emergency Use Authorization (EUA). This EUA will remain  in effect (meaning this test can be used) for the duration of the COVID-19 declaration under Se ction 564(b)(1) of the Act, 21 U.S.C. section 360bbb-3(b)(1), unless the authorization is terminated or revoked sooner.  Performed at [facility], [place] [address], [place], [place] [address]   Surgical pcr screen     Status: None   Collection Time: 02/14/21  1:57 AM   Specimen: Nasal Mucosa; Nasal Swab  Result Value Ref Range Status   MRSA, PCR  NEGATIVE NEGATIVE Final   Staphylococcus aureus NEGATIVE NEGATIVE Final    Comment: (NOTE) The Xpert SA Assay (FDA approved for NASAL specimens in patients 53 years of age and older), is one component of a comprehensive surveillance program. It is not intended to diagnose infection nor to guide or monitor treatment. Performed at [facility], [address] [place], [place] [address]     Radiology Reports CT ABDOMEN PELVIS WO CONTRAST  Result Date: 02/10/2021 CLINICAL DATA:  Abdominal pain, constipation EXAM: CT ABDOMEN AND PELVIS WITHOUT CONTRAST TECHNIQUE: Multidetector CT imaging of the abdomen and pelvis was performed following the standard protocol without IV contrast. COMPARISON:  11/06/2020 FINDINGS: Lower chest: Lung bases are essentially clear. Hepatobiliary: Unenhanced liver is unremarkable. Gallbladder is unremarkable. No intrahepatic or extrahepatic ductal dilatation. Pancreas: Within normal limits. Spleen: Within normal limits. Adrenals/Urinary Tract: 19 mm left adrenal adenoma (series 2/image 19). Right adrenal gland is within normal limits. Two right renal cysts measuring up to 4.0 cm in the right lower pole (series 2/image 30). Left kidney is within normal limits. No renal calculi or hydronephrosis. Mildly thick-walled bladder, although underdistended. Stomach/Bowel: Stomach is within normal limits. No evidence of small bowel obstruction. Dilated sigmoid colon with narrowing in the central mesenteric root (series 2/image 44). This is recurrent but less severe when compared to the prior. Sigmoid colon measures up to 6.2 cm on the current study, without wall thickening or mesenteric edema. Vascular/Lymphatic: No evidence of abdominal aortic aneurysm. Atherosclerotic calcifications of the abdominal aorta and branch vessels. No suspicious abdominopelvic lymphadenopathy. Reproductive: Prostatomegaly, suggesting BPH. Other: No abdominopelvic ascites. Musculoskeletal: Degenerative changes of  the visualized thoracolumbar spine. 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 This appearance is recurrent but improved when compared to the prior. Additional ancillary findings as above. Electronically Signed   By: Julian Hy M.D.   On: 02/10/2021 19:39   DG Abd 1 View  Result Date: 02/12/2021 CLINICAL DATA:  Volvulus status post sigmoidoscopy. EXAM: ABDOMEN - 1 VIEW COMPARISON:  CT of February 10, 2021.  Radiograph of November 07, 2020. FINDINGS: Large amount of contrast and stool is seen in what appears to be the right colon. No significant bowel dilatation is noted currently. No abnormal calcifications are noted. IMPRESSION: No significant abnormal bowel dilatation is seen currently. Electronically Signed   By: Marijo Conception M.D.   On: 02/12/2021 14:53   ECHOCARDIOGRAM COMPLETE  Result Date: 02/13/2021  ECHOCARDIOGRAM REPORT   Patient Name:   MOHAMMAD GRANADE Date of Exam: 02/13/2021 Medical Rec #:  [id number]        Height:       67.0 in Accession #:    [id number]       Weight:       138.2 lb Date of Birth:  Mar 17, 1951         BSA:          1.728 m Patient Age:    65 years         BP:           148/80 mmHg Patient Gender: M                HR:           61 bpm. Exam Location:  [facility] Procedure: 2D Echo, Cardiac Doppler and Color Doppler Indications:    Atrial Flutter I48.92  History:        Patient has no prior history of Echocardiogram examinations.                 Risk Factors:Hypertension. Alcohol abuse.  Sonographer:    Alvino Chapel RCS Referring Phys: [id number] [place]  1. Left ventricular ejection fraction, by estimation, is 60 to 65%. The left ventricle has normal function. The left ventricle has no regional wall motion abnormalities. There is moderate left ventricular hypertrophy. Left ventricular diastolic parameters are indeterminate.  2. Right ventricular systolic function is normal. The right ventricular size is normal. There is normal  pulmonary artery systolic pressure. The estimated right ventricular systolic pressure is 95.1 mmHg.  3. Left atrial size was mild to moderately dilated.  4. Right atrial size was mildly dilated.  5. The mitral valve is grossly normal. Mild mitral valve regurgitation.  6. The aortic valve is tricuspid. Aortic valve regurgitation is mild. Aortic regurgitation PHT measures 719 msec.  7. The inferior vena cava is normal in size with greater than 50% respiratory variability, suggesting right atrial pressure of 3 mmHg. FINDINGS  Left Ventricle: Left ventricular ejection fraction, by estimation, is 60 to 65%. The left ventricle has normal function. The left ventricle has no regional wall motion abnormalities. The left ventricular internal cavity size was normal in size. There is  moderate left ventricular hypertrophy. Left ventricular diastolic parameters are indeterminate. Right Ventricle: The right ventricular size is normal. No increase in right ventricular wall thickness. Right ventricular systolic function is normal. There is normal pulmonary artery systolic pressure. The tricuspid regurgitant velocity is 2.45 m/s, and  with an assumed right atrial pressure of 3 mmHg, the estimated right ventricular systolic pressure is 88.4 mmHg. Left Atrium: Left atrial size was mild to moderately dilated. Right Atrium: Right atrial size was mildly dilated. Pericardium: There is no evidence of pericardial effusion. Mitral Valve: The mitral valve is grossly normal. There is mild thickening of the mitral valve leaflet(s). Mild mitral valve regurgitation. Tricuspid Valve: The tricuspid valve is grossly normal. Tricuspid valve regurgitation is mild. Aortic Valve: The aortic valve is tricuspid. Aortic valve regurgitation is mild. Aortic regurgitation PHT measures 719 msec. Pulmonic Valve: The pulmonic valve was grossly normal. Pulmonic valve regurgitation is trivial. Aorta: The aortic root is normal in size and structure. Venous: The  inferior vena cava is normal in size with greater than 50% respiratory variability, suggesting right atrial pressure of 3 mmHg. IAS/Shunts: No atrial level shunt detected by color flow Doppler.  LEFT VENTRICLE PLAX 2D LVIDd:  4.20 cm  Diastology LVIDs:         2.70 cm  LV e' medial:    8.27 cm/s LV PW:         1.50 cm  LV E/e' medial:  9.3 LV IVS:        1.30 cm  LV e' lateral:   11.40 cm/s LVOT diam:     2.00 cm  LV E/e' lateral: 6.8 LV SV:         53 LV SV Index:   31 LVOT Area:     3.14 cm  RIGHT VENTRICLE RV S prime:     12.80 cm/s TAPSE (M-mode): 2.0 cm LEFT ATRIUM             Index       RIGHT ATRIUM           Index LA diam:        3.40 cm 1.97 cm/m  RA Area:     21.80 cm LA Vol (A2C):   73.5 ml 42.52 ml/m RA Volume:   63.00 ml  36.45 ml/m LA Vol (A4C):   73.4 ml 42.47 ml/m LA Biplane Vol: 73.4 ml 42.47 ml/m  AORTIC VALVE LVOT Vmax:   86.95 cm/s LVOT Vmean:  55.500 cm/s LVOT VTI:    0.168 m AI PHT:      719 msec  AORTA Ao Root diam: 3.70 cm MITRAL VALVE               TRICUSPID VALVE MV Area (PHT): 4.80 cm    TR Peak grad:   24.0 mmHg MV Decel Time: 158 msec    TR Vmax:        245.00 cm/s MV E velocity: 77.10 cm/s MV A velocity: 55.50 cm/s  SHUNTS MV E/A ratio:  1.39        Systemic VTI:  0.17 m                            Systemic Diam: 2.00 cm Rozann Lesches MD Electronically signed by Rozann Lesches MD Signature Date/Time: 02/13/2021/12:50:35 PM    Final       CBC Recent Labs  Lab 02/10/21 1723 02/11/21 2841 02/13/21 0606 02/14/21 0617 02/15/21 0424  WBC 7.1 5.6 4.7 5.1 11.0*  HGB 16.2 14.3 13.1 13.4 13.5  HCT 49.2 42.3 38.9* 40.1 39.5  PLT 192 185 174 187 205  MCV 95.9 94.0 94.2 94.1 92.5  MCH 31.6 31.8 31.7 31.5 31.6  MCHC 32.9 33.8 33.7 33.4 34.2  RDW 13.6 13.3 13.2 13.2 13.0  LYMPHSABS 2.5  --   --   --   --   MONOABS 0.5  --   --   --   --   EOSABS 0.1  --   --   --   --   BASOSABS 0.0  --   --   --   --     Chemistries  Recent Labs  Lab 02/10/21 1723  02/10/21 2212 02/11/21 0812 02/13/21 0606 02/14/21 0617 02/15/21 0424  NA 134*  --  137 137 137 134*  K 4.3  --  3.9 4.0 3.8 4.1  CL 101  --  109 109 108 101  CO2 25  --  22 24 24 26   GLUCOSE 104*  --  105* 116* 90 128*  BUN 13  --  10 6* 7* 9  CREATININE 1.38*  --  1.00 0.90 0.92 0.97  CALCIUM 9.4  --  8.7* 8.6* 8.6* 8.8*  MG  --  2.2  --  1.9  --  1.7  AST 22  --   --  15  --   --   ALT 32  --   --  17  --   --   ALKPHOS 89  --   --  62  --   --   BILITOT 0.5  --   --  0.6  --   --      Barton Dubois M.D on 02/16/2021 at 4:42 PM  Go to [email or web address] - for contact info  [facility] - Office  [phone]

## 2021-02-16 NOTE — Progress Notes (Addendum)
Subjective:  CC: Marc Jimenez is a 70 y.o. male  Hospital stay day 6, 2 Days Post-Op sigmoidectomy for sigmoid volvulus  HPI: Report of patient confusion and agitation, again, controlled with ativan.  He is somnolent this am and unable to answer questions but opens eyes to voice only.  ROS:  Unable to obtain secondary to patient status.  Objective:   Temp:  [98.5 F (36.9 C)] 98.5 F (36.9 C) (06/12 0448) Pulse Rate:  [71-108] 78 (06/12 0448) Resp:  [16-18] 16 (06/12 0448) BP: (144-169)/(89-103) 144/89 (06/12 0448) SpO2:  [98 %-100 %] 98 % (06/12 0448)     Height: 5\' 7"  (170.2 cm) Weight: 62.7 kg BMI (Calculated): 21.64   Intake/Output this shift:   Intake/Output Summary (Last 24 hours) at 02/16/2021 0953 Last data filed at 02/15/2021 1843 Gross per 24 hour  Intake 480 ml  Output --  Net 480 ml    Constitutional :  appears stated age and no distress  Respiratory:  clear to auscultation bilaterally  Cardiovascular:  regular rate and rhythm  Gastrointestinal: Soft, no guarding, slight tympany, midline staples intact no drainage, swelling still same .   Skin: Cool and moist. Staples intact  Psychiatric: Normal affect, non-agitated, not confused       LABS:  CMP Latest Ref Rng & Units 02/15/2021 02/14/2021 02/13/2021  Glucose 70 - 99 mg/dL 128(H) 90 116(H)  BUN 8 - 23 mg/dL 9 7(L) 6(L)  Creatinine 0.61 - 1.24 mg/dL 0.97 0.92 0.90  Sodium 135 - 145 mmol/L 134(L) 137 137  Potassium 3.5 - 5.1 mmol/L 4.1 3.8 4.0  Chloride 98 - 111 mmol/L 101 108 109  CO2 22 - 32 mmol/L 26 24 24   Calcium 8.9 - 10.3 mg/dL 8.8(L) 8.6(L) 8.6(L)  Total Protein 6.5 - 8.1 g/dL - - 5.8(L)  Total Bilirubin 0.3 - 1.2 mg/dL - - 0.6  Alkaline Phos 38 - 126 U/L - - 62  AST 15 - 41 U/L - - 15  ALT 0 - 44 U/L - - 17   CBC Latest Ref Rng & Units 02/15/2021 02/14/2021 02/13/2021  WBC 4.0 - 10.5 K/uL 11.0(H) 5.1 4.7  Hemoglobin 13.0 - 17.0 g/dL 13.5 13.4 13.1  Hematocrit 39.0 - 52.0 % 39.5 40.1 38.9(L)   Platelets 150 - 400 K/uL 205 187 174    RADS: N/a Assessment:   POD#1 sigmoidectomy for sigmoid volvulus. Abdomen flat, no guarding, but is having increasing leukocytosis. Clinically no signs of infection, intra-abdominal issues at this point.  Will continue clears until BM.    Pt still at high risk of perioperative complications such as wound infection, dehiscence, anastamotic leak due to inability to cooperate with recommendations.  Continue to keep staple line covered with dressing.

## 2021-02-17 ENCOUNTER — Encounter (HOSPITAL_COMMUNITY): Payer: Self-pay | Admitting: General Surgery

## 2021-02-17 LAB — BASIC METABOLIC PANEL
Anion gap: 4 — ABNORMAL LOW (ref 5–15)
BUN: 13 mg/dL (ref 8–23)
CO2: 26 mmol/L (ref 22–32)
Calcium: 8.7 mg/dL — ABNORMAL LOW (ref 8.9–10.3)
Chloride: 107 mmol/L (ref 98–111)
Creatinine, Ser: 0.92 mg/dL (ref 0.61–1.24)
GFR, Estimated: >60 mL/min (ref >60)
Glucose, Bld: 87 mg/dL (ref 70–99)
Potassium: 3.7 mmol/L (ref 3.5–5.1)
Sodium: 137 mmol/L (ref 135–145)

## 2021-02-17 LAB — CBC
HCT: 38.5 % — ABNORMAL LOW (ref 39.0–52.0)
Hemoglobin: 13 g/dL (ref 13.0–17.0)
MCH: 31.6 pg (ref 26.0–34.0)
MCHC: 33.8 g/dL (ref 30.0–36.0)
MCV: 93.4 fL (ref 80.0–100.0)
Platelets: 208 10*3/uL (ref 150–400)
RBC: 4.12 MIL/uL — ABNORMAL LOW (ref 4.22–5.81)
RDW: 13.3 % (ref 11.5–15.5)
WBC: 6.3 10*3/uL (ref 4.0–10.5)
nRBC: 0 % (ref 0.0–0.2)

## 2021-02-17 NOTE — Progress Notes (Signed)
[facility] Progress Note  3 Days Post-Op  Subjective: Reports feeling full and having Bms. Nothing recorded but RN says he is too.  Was very lethargic yesterday per the RN. More awake and appropriate today.   Objective: Vital signs in last 24 hours: Temp:  [97.7 F (36.5 C)-98 F (36.7 C)] 98 F (36.7 C) (06/13 1259) Pulse Rate:  [70-72] 70 (06/13 1259) Resp:  [16-18] 16 (06/13 1259) BP: (112-129)/(78-86) 129/78 (06/13 1259) SpO2:  [96 %-99 %] 96 % (06/13 1259) Last BM Date: 02/13/21  Intake/Output from previous day: 06/12 0701 - 06/13 0700 In: 1422.1 [P.O.:580; I.V.:842.1] Out: 100 [Urine:100] Intake/Output this shift: Total I/O In: 360 [P.O.:360] Out: -   General appearance: alert, cooperative, and no distress Resp: normal work of breathing GI: soft, nondistended, appropriately tender, staples c/d/I without erythema or drainage  Lab Results:  Recent Labs    02/15/21 0424 02/17/21 0432  WBC 11.0* 6.3  HGB 13.5 13.0  HCT 39.5 38.5*  PLT 205 208   BMET Recent Labs    02/15/21 0424 02/17/21 0432  NA 134* 137  K 4.1 3.7  CL 101 107  CO2 26 26  GLUCOSE 128* 87  BUN 9 13  CREATININE 0.97 0.92  CALCIUM 8.8* 8.7*   PT/INR No results for input(s): LABPROT, INR in the last 72 hours.  Studies/Results: No results found.  Anti-infectives: Anti-infectives (From admission, onward)    Start     Dose/Rate Route Frequency Ordered Stop   02/14/21 0600  ertapenem (INVANZ) 1,000 mg in sodium chloride 0.9 % 100 mL IVPB        1 g 200 mL/hr over 30 Minutes Intravenous On call to O.R. 02/13/21 1819 02/14/21 2143   02/13/21 0945  neomycin (MYCIFRADIN) tablet 1,000 mg        1,000 mg Oral Every 8 hours 02/13/21 0855 02/13/21 2048   02/13/21 0945  metroNIDAZOLE (FLAGYL) tablet 500 mg        500 mg Oral Every 8 hours 02/13/21 0855 02/13/21 2048       Assessment/Plan: Mr. Ressel is a 70 yo s/p sigmoid colectomy for volvulus. Having bowel function.   Adv diet as tolerated  H&H stable, no signs of infection at this time     LOS: 7 days    Virl Cagey 02/17/2021

## 2021-02-17 NOTE — Progress Notes (Addendum)
    Subjective:  Denies SSCP, palpitations or Dyspnea Not very interactive  Taking soft solids   Objective:  Vitals:   02/16/21 0448 02/16/21 1402 02/16/21 1959 02/17/21 0531  BP: (!) 144/89 124/88 112/79 123/86  Pulse: 78 83 72 70  Resp: 16  17 18   Temp: 98.5 F (36.9 C) 98.3 F (36.8 C) 97.7 F (36.5 C) 98 F (36.7 C)  TempSrc:  Oral Oral Oral  SpO2: 98% 99% 98% 99%  Weight:      Height:        Intake/Output from previous day:  Intake/Output Summary (Last 24 hours) at 02/17/2021 1041 Last data filed at 02/17/2021 0700 Gross per 24 hour  Intake 1322.06 ml  Output 100 ml  Net 1222.06 ml    Physical Exam: Black male no distress Post abdominal surgery No murmur  Lungs clear MS:  flat affect   Lab Results: Basic Metabolic Panel: Recent Labs    02/15/21 0424 02/17/21 0432  NA 134* 137  K 4.1 3.7  CL 101 107  CO2 26 26  GLUCOSE 128* 87  BUN 9 13  CREATININE 0.97 0.92  CALCIUM 8.8* 8.7*  MG 1.7  --   PHOS 3.2  --    Liver Function Tests: No results for input(s): AST, ALT, ALKPHOS, BILITOT, PROT, ALBUMIN in the last 72 hours. No results for input(s): LIPASE, AMYLASE in the last 72 hours. CBC: Recent Labs    02/15/21 0424 02/17/21 0432  WBC 11.0* 6.3  HGB 13.5 13.0  HCT 39.5 38.5*  MCV 92.5 93.4  PLT 205 208     Imaging: No results found.  Cardiac Studies:  ECG: Chronic flutter rate 70's    Telemetry: not on   Echo: 02/13/21 EF 60-65%   Medications:    amLODipine  2.5 mg Oral Daily   Chlorhexidine Gluconate Cloth  6 each Topical Daily   enoxaparin (LOVENOX) injection  40 mg Subcutaneous J68T   folic acid  1 mg Oral Daily   multivitamin with minerals  1 tablet Oral Daily   nicotine  21 mg Transdermal Daily   QUEtiapine  12.5 mg Oral QHS   thiamine  100 mg Oral Daily   Or   thiamine  100 mg Intravenous Daily      Assessment/Plan:   Preop:  post surgery for sigmoid volvulus progressing well no cardiac complications Flutter:   chronic not on telemetry continue current meds  Bipolar:  flat affect on Quetiapine  Cardiology will sign off   Jenkins Rouge 02/17/2021, 10:41 AM

## 2021-02-17 NOTE — Progress Notes (Signed)
Patient Demographics:    Marc Jimenez, is a 71 y.o. male, DOB - [birth year], LJQ:[id number]  Admit date - 02/10/2021   Admitting Physician Ejiroghene Arlyce Dice, MD  Outpatient Primary MD for the patient is Marc Burly, MD  LOS - 7   Chief Complaint  Patient presents with   Constipation        Subjective:    Marc Jimenez is pain with movement nausea, no vomiting, no abdominal pain.  Reports bowel movements continue to pass gas.  He is cooperative to examination and following commands appropriately.   Assessment  & Plan :    Principal Problem:   Sigmoid volvulus ([place]) Active Problems:   Alcohol abuse   HTN (hypertension)   Preoperative cardiovascular examination   Typical atrial flutter [facility])  Brief Summary:- 71 y.o. male with medical history significant for alcohol abuse, narcolepsy, hypertension, heart disease who is a poor historian admitted on 02/10/2021 with recurrent sigmoid volvulus and AKI  A/p 1)Recurrent Sigmoid Colon Volvulus---Had prior colonoscopic reduction in March 2022 -- Patient previously refused surgical intervention -Status post flex sig on 02/12/2021 with reduction of volvulus -Postprocedure KUB shows no acute abnormalities -s/p Partial colectomy by general surgeon on 02/14/21 -Discussed with Dr Marc Jimenez 02/17/21; plan is to further advance diet as tolerated. Hopefully home on 02/18/21 -positive BM reported; patient passing flatus. -continue supportive care.  2)AKI----acute kidney injury --due to dehydration in the setting of poor oral intake due to volvulus and Lasix use Creatinine 1.38 >>0.9 -Overall resolved with hydration -renally adjust medications, avoid nephrotoxic agents / dehydration  / hypotension -repeat BMET in am  3)Etoh Abuse-lorazepam per CIWA protocol continue multivitamin folic acid and thiamine -currently no demonstrating active withdrawal.  4)HTN-IV  labetalol as needed for elevated BP, continue to hold Lasix, patient's norvasc restarted and so far well tolerated.   5)FEN-IV dextrose until oral intake is more reliable. -per surgery advanced to CLD.  6)Social/Ethics/hospital acquired delirium--patient is a poor historian, intermittent episodes of agitation and hallucinations reported. -Patient is a full code -Patient was cooperative and appropriate today. -continue PRN ativan and low dose of daily seroquel.  7)Persistent atrial flutter with 4-1 block--- rate appears controlled, CHA2DS2-VASc score is 3 -Not a candidate for anticoagulation due to being noncompliant. -Cardiology input appreciated  8)Generalized Weakness/Ambulatory dysfunction--- at risk of falling. -will ask PT to see patient.  Disposition/Need for in-Hospital Stay- patient unable to be discharged at this time due to -recurrent sigmoid volvulus requiring surgical intervention, -now awaiting return of bowel function and tolerance of oral intake postoperatively prior to discharge. -Patient calmer and more appropriate.   Status is: Inpatient  Remains inpatient appropriate because: Please see disposition above   Disposition: The patient is from: Home              Anticipated d/c is to: Home              Anticipated d/c date is: 1 day              Patient currently is not medically stable to d/c.  Barriers: Not Clinically Stable-   Code Status : -  Code Status: Full Code   Family Communication:    no family at bedside.  Consults  :  Gen surgery  DVT Prophylaxis  :   - SCDs  enoxaparin (LOVENOX) injection 40 mg Start: 02/15/21 1000 SCD's Start: 02/14/21 1623 SCDs Start: 02/10/21 2309   Lab Results  Component Value Date   PLT 208 02/17/2021    Inpatient Medications  Scheduled Meds:  amLODipine  2.5 mg Oral Daily   Chlorhexidine Gluconate Cloth  6 each Topical Daily   enoxaparin (LOVENOX) injection  40 mg Subcutaneous Z61W   folic acid  1 mg Oral Daily    multivitamin with minerals  1 tablet Oral Daily   nicotine  21 mg Transdermal Daily   QUEtiapine  12.5 mg Oral QHS   thiamine  100 mg Oral Daily   Or   thiamine  100 mg Intravenous Daily   Continuous Infusions:   PRN Meds:.acetaminophen **OR** acetaminophen, HYDROmorphone (DILAUDID) injection, labetalol, LORazepam, ondansetron **OR** ondansetron (ZOFRAN) IV, oxyCODONE, simethicone   Anti-infectives (From admission, onward)    Start     Dose/Rate Route Frequency Ordered Stop   02/14/21 0600  ertapenem (INVANZ) 1,000 mg in sodium chloride 0.9 % 100 mL IVPB        1 g 200 mL/hr over 30 Minutes Intravenous On call to O.R. 02/13/21 1819 02/14/21 2143   02/13/21 0945  neomycin (MYCIFRADIN) tablet 1,000 mg        1,000 mg Oral Every 8 hours 02/13/21 0855 02/13/21 2048   02/13/21 0945  metroNIDAZOLE (FLAGYL) tablet 500 mg        500 mg Oral Every 8 hours 02/13/21 0855 02/13/21 2048         Objective:   Vitals:   02/16/21 1402 02/16/21 1959 02/17/21 0531 02/17/21 1259  BP: 124/88 112/79 123/86 129/78  Pulse: 83 72 70 70  Resp:  17 18 16   Temp: 98.3 F (36.8 C) 97.7 F (36.5 C) 98 F (36.7 C) 98 F (36.7 C)  TempSrc: Oral Oral Oral Oral  SpO2: 99% 98% 99% 96%  Weight:      Height:        Wt Readings from Last 3 Encounters:  02/10/21 62.7 kg  01/09/21 61.2 kg  09/24/19 64.4 kg    Intake/Output Summary (Last 24 hours) at 02/17/2021 1658 Last data filed at 02/17/2021 1200 Gross per 24 hour  Intake 1682.06 ml  Output 100 ml  Net 1582.06 ml   Physical Exam General exam: Alert, awake, oriented x 2; in no acute distress during my evaluation.  Following commands appropriately.  Expressed no chest pain, no nausea, no vomiting, no abdominal pain.  Patient had couple bowel movements overnight passing gas and reports he is passing gas. Respiratory system: Clear to auscultation. Respiratory effort normal. Cardiovascular system:RRR. No murmurs, rubs or gallops. Gastrointestinal  system: Abdomen is nondistended, soft and nontender on palpation.  Abdomen: Without any pain on transmission; staples/wound clean dry and intact. Central nervous system: Alert and oriented. No focal neurological deficits. Extremities: No C/C/E, +pedal pulses Skin: No rashes, lesions or ulcers Psychiatry: flat affect.   Data Review:   Micro Results Recent Results (from the past 240 hour(s))  SARS CORONAVIRUS 2 (TAT 6-24 HRS) Nasopharyngeal Nasopharyngeal Swab     Status: None   Collection Time: 02/10/21  9:24 PM   Specimen: Nasopharyngeal Swab  Result Value Ref Range Status   SARS Coronavirus 2 NEGATIVE NEGATIVE Final    Comment: (NOTE) SARS-CoV-2 target nucleic acids are NOT DETECTED.  The SARS-CoV-2 RNA is generally detectable in upper and lower respiratory specimens during the acute phase  of infection. Negative results do not preclude SARS-CoV-2 infection, do not rule out co-infections with other pathogens, and should not be used as the sole basis for treatment or other patient management decisions. Negative results must be combined with clinical observations, patient history, and epidemiological information. The expected result is Negative.  Fact Sheet for Patients: SugarRoll.be  Fact Sheet for Healthcare Providers: https://www.woods-mathews.com/  This test is not yet approved or cleared by the [place] FDA and  has been authorized for detection and/or diagnosis of SARS-CoV-2 by FDA under an Emergency Use Authorization (EUA). This EUA will remain  in effect (meaning this test can be used) for the duration of the COVID-19 declaration under Se ction 564(b)(1) of the Act, 21 U.S.C. section 360bbb-3(b)(1), unless the authorization is terminated or revoked sooner.  Performed at [facility], [place] [address], [place], [place] [address]   Surgical pcr screen     Status: None   Collection Time: 02/14/21  1:57 AM   Specimen:  Nasal Mucosa; Nasal Swab  Result Value Ref Range Status   MRSA, PCR NEGATIVE NEGATIVE Final   Staphylococcus aureus NEGATIVE NEGATIVE Final    Comment: (NOTE) The Xpert SA Assay (FDA approved for NASAL specimens in patients 48 years of age and older), is one component of a comprehensive surveillance program. It is not intended to diagnose infection nor to guide or monitor treatment. Performed at [facility], [address], [place], [place] [address]     Radiology Reports CT ABDOMEN PELVIS WO CONTRAST  Result Date: 02/10/2021 CLINICAL DATA:  Abdominal pain, constipation EXAM: CT ABDOMEN AND PELVIS WITHOUT CONTRAST TECHNIQUE: Multidetector CT imaging of the abdomen and pelvis was performed following the standard protocol without IV contrast. COMPARISON:  11/06/2020 FINDINGS: Lower chest: Lung bases are essentially clear. Hepatobiliary: Unenhanced liver is unremarkable. Gallbladder is unremarkable. No intrahepatic or extrahepatic ductal dilatation. Pancreas: Within normal limits. Spleen: Within normal limits. Adrenals/Urinary Tract: 19 mm left adrenal adenoma (series 2/image 19). Right adrenal gland is within normal limits. Two right renal cysts measuring up to 4.0 cm in the right lower pole (series 2/image 30). Left kidney is within normal limits. No renal calculi or hydronephrosis. Mildly thick-walled bladder, although underdistended. Stomach/Bowel: Stomach is within normal limits. No evidence of small bowel obstruction. Dilated sigmoid colon with narrowing in the central mesenteric root (series 2/image 44). This is recurrent but less severe when compared to the prior. Sigmoid colon measures up to 6.2 cm on the current study, without wall thickening or mesenteric edema. Vascular/Lymphatic: No evidence of abdominal aortic aneurysm. Atherosclerotic calcifications of the abdominal aorta and branch vessels. No suspicious abdominopelvic lymphadenopathy. Reproductive: Prostatomegaly, suggesting BPH. Other:  No abdominopelvic ascites. Musculoskeletal: Degenerative changes of the visualized thoracolumbar spine. 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 This appearance is recurrent but improved when compared to the prior. Additional ancillary findings as above. Electronically Signed   By: Julian Hy M.D.   On: 02/10/2021 19:39   DG Abd 1 View  Result Date: 02/12/2021 CLINICAL DATA:  Volvulus status post sigmoidoscopy. EXAM: ABDOMEN - 1 VIEW COMPARISON:  CT of February 10, 2021.  Radiograph of November 07, 2020. FINDINGS: Large amount of contrast and stool is seen in what appears to be the right colon. No significant bowel dilatation is noted currently. No abnormal calcifications are noted. IMPRESSION: No significant abnormal bowel dilatation is seen currently. Electronically Signed   By: Marijo Conception M.D.   On: 02/12/2021 14:53   ECHOCARDIOGRAM  COMPLETE  Result Date: 02/13/2021    ECHOCARDIOGRAM REPORT   Patient Name:   JOAB CARDEN Date of Exam: 02/13/2021 Medical Rec #:  [id number]        Height:       67.0 in Accession #:    [id number]       Weight:       138.2 lb Date of Birth:  1951-08-05         BSA:          1.728 m Patient Age:    79 years         BP:           148/80 mmHg Patient Gender: M                HR:           61 bpm. Exam Location:  [facility] Procedure: 2D Echo, Cardiac Doppler and Color Doppler Indications:    Atrial Flutter I48.92  History:        Patient has no prior history of Echocardiogram examinations.                 Risk Factors:Hypertension. Alcohol abuse.  Sonographer:    Alvino Chapel RCS Referring Phys: [id number] [place]  1. Left ventricular ejection fraction, by estimation, is 60 to 65%. The left ventricle has normal function. The left ventricle has no regional wall motion abnormalities. There is moderate left ventricular hypertrophy. Left ventricular diastolic parameters are indeterminate.  2. Right ventricular systolic function is  normal. The right ventricular size is normal. There is normal pulmonary artery systolic pressure. The estimated right ventricular systolic pressure is 16.0 mmHg.  3. Left atrial size was mild to moderately dilated.  4. Right atrial size was mildly dilated.  5. The mitral valve is grossly normal. Mild mitral valve regurgitation.  6. The aortic valve is tricuspid. Aortic valve regurgitation is mild. Aortic regurgitation PHT measures 719 msec.  7. The inferior vena cava is normal in size with greater than 50% respiratory variability, suggesting right atrial pressure of 3 mmHg. FINDINGS  Left Ventricle: Left ventricular ejection fraction, by estimation, is 60 to 65%. The left ventricle has normal function. The left ventricle has no regional wall motion abnormalities. The left ventricular internal cavity size was normal in size. There is  moderate left ventricular hypertrophy. Left ventricular diastolic parameters are indeterminate. Right Ventricle: The right ventricular size is normal. No increase in right ventricular wall thickness. Right ventricular systolic function is normal. There is normal pulmonary artery systolic pressure. The tricuspid regurgitant velocity is 2.45 m/s, and  with an assumed right atrial pressure of 3 mmHg, the estimated right ventricular systolic pressure is 10.9 mmHg. Left Atrium: Left atrial size was mild to moderately dilated. Right Atrium: Right atrial size was mildly dilated. Pericardium: There is no evidence of pericardial effusion. Mitral Valve: The mitral valve is grossly normal. There is mild thickening of the mitral valve leaflet(s). Mild mitral valve regurgitation. Tricuspid Valve: The tricuspid valve is grossly normal. Tricuspid valve regurgitation is mild. Aortic Valve: The aortic valve is tricuspid. Aortic valve regurgitation is mild. Aortic regurgitation PHT measures 719 msec. Pulmonic Valve: The pulmonic valve was grossly normal. Pulmonic valve regurgitation is trivial. Aorta:  The aortic root is normal in size and structure. Venous: The inferior vena cava is normal in size with greater than 50% respiratory variability, suggesting right atrial pressure of 3 mmHg. IAS/Shunts: No atrial level shunt detected by color  flow Doppler.  LEFT VENTRICLE PLAX 2D LVIDd:         4.20 cm  Diastology LVIDs:         2.70 cm  LV e' medial:    8.27 cm/s LV PW:         1.50 cm  LV E/e' medial:  9.3 LV IVS:        1.30 cm  LV e' lateral:   11.40 cm/s LVOT diam:     2.00 cm  LV E/e' lateral: 6.8 LV SV:         53 LV SV Index:   31 LVOT Area:     3.14 cm  RIGHT VENTRICLE RV S prime:     12.80 cm/s TAPSE (M-mode): 2.0 cm LEFT ATRIUM             Index       RIGHT ATRIUM           Index LA diam:        3.40 cm 1.97 cm/m  RA Area:     21.80 cm LA Vol (A2C):   73.5 ml 42.52 ml/m RA Volume:   63.00 ml  36.45 ml/m LA Vol (A4C):   73.4 ml 42.47 ml/m LA Biplane Vol: 73.4 ml 42.47 ml/m  AORTIC VALVE LVOT Vmax:   86.95 cm/s LVOT Vmean:  55.500 cm/s LVOT VTI:    0.168 m AI PHT:      719 msec  AORTA Ao Root diam: 3.70 cm MITRAL VALVE               TRICUSPID VALVE MV Area (PHT): 4.80 cm    TR Peak grad:   24.0 mmHg MV Decel Time: 158 msec    TR Vmax:        245.00 cm/s MV E velocity: 77.10 cm/s MV A velocity: 55.50 cm/s  SHUNTS MV E/A ratio:  1.39        Systemic VTI:  0.17 m                            Systemic Diam: 2.00 cm Rozann Lesches MD Electronically signed by Rozann Lesches MD Signature Date/Time: 02/13/2021/12:50:35 PM    Final       CBC Recent Labs  Lab 02/10/21 1723 02/11/21 5277 02/13/21 0606 02/14/21 0617 02/15/21 0424 02/17/21 0432  WBC 7.1 5.6 4.7 5.1 11.0* 6.3  HGB 16.2 14.3 13.1 13.4 13.5 13.0  HCT 49.2 42.3 38.9* 40.1 39.5 38.5*  PLT 192 185 174 187 205 208  MCV 95.9 94.0 94.2 94.1 92.5 93.4  MCH 31.6 31.8 31.7 31.5 31.6 31.6  MCHC 32.9 33.8 33.7 33.4 34.2 33.8  RDW 13.6 13.3 13.2 13.2 13.0 13.3  LYMPHSABS 2.5  --   --   --   --   --   MONOABS 0.5  --   --   --   --   --    EOSABS 0.1  --   --   --   --   --   BASOSABS 0.0  --   --   --   --   --     Chemistries  Recent Labs  Lab 02/10/21 1723 02/10/21 2212 02/11/21 0812 02/13/21 0606 02/14/21 0617 02/15/21 0424 02/17/21 0432  NA 134*  --  137 137 137 134* 137  K 4.3  --  3.9 4.0 3.8 4.1 3.7  CL 101  --  109 109  108 101 107  CO2 25  --  22 24 24 26 26   GLUCOSE 104*  --  105* 116* 90 128* 87  BUN 13  --  10 6* 7* 9 13  CREATININE 1.38*  --  1.00 0.90 0.92 0.97 0.92  CALCIUM 9.4  --  8.7* 8.6* 8.6* 8.8* 8.7*  MG  --  2.2  --  1.9  --  1.7  --   AST 22  --   --  15  --   --   --   ALT 32  --   --  17  --   --   --   ALKPHOS 89  --   --  62  --   --   --   BILITOT 0.5  --   --  0.6  --   --   --      Barton Dubois M.D on 02/17/2021 at 4:58 PM  Go to [email or web address] - for contact info  [facility] - Office  [phone]

## 2021-02-17 NOTE — Progress Notes (Signed)
Patient more alert today.  Patient's diet was advanced to full liquids.  Patient ate very little and was frustrated that he was only getting liquid food.  Patient's nephew, [name], called to talk to the patient and he settled down.  [name] also stopped by and visited with him for a little while.  Patient refused to get up to walk.   Patient remained stable throughout the shift.

## 2021-02-18 DIAGNOSIS — Z20828 Contact with and (suspected) exposure to other viral communicable diseases: Secondary | ICD-10-CM | POA: Diagnosis not present

## 2021-02-18 DIAGNOSIS — U071 COVID-19: Secondary | ICD-10-CM | POA: Diagnosis not present

## 2021-02-18 DIAGNOSIS — F1010 Alcohol abuse, uncomplicated: Secondary | ICD-10-CM

## 2021-02-18 MED ORDER — FOLIC ACID 1 MG PO TABS: 1.0000 mg | ORAL_TABLET | Freq: Every day | ORAL | 1 refills | Status: AC

## 2021-02-18 MED ORDER — THIAMINE HCL 100 MG PO TABS: 100.0000 mg | ORAL_TABLET | Freq: Every day | ORAL | 1 refills | Status: DC

## 2021-02-18 MED ORDER — QUETIAPINE FUMARATE 25 MG PO TABS: 12.5000 mg | ORAL_TABLET | Freq: Every day | ORAL | 1 refills | Status: DC

## 2021-02-18 MED ORDER — OXYCODONE HCL 5 MG PO TABS: 5.0000 mg | ORAL_TABLET | Freq: Three times a day (TID) | ORAL | 0 refills | Status: DC | PRN

## 2021-02-18 MED ORDER — NICOTINE 21 MG/24HR TD PT24: 21.0000 mg | MEDICATED_PATCH | Freq: Every day | TRANSDERMAL | 0 refills | Status: DC

## 2021-02-18 NOTE — TOC Transition Note (Signed)
Transition of Care [facility]) - CM/SW Discharge Note   Patient Details  Name: Marc Jimenez MRN: [id number] Date of Birth: 1950-12-13  Transition of Care [facility]) CM/SW Contact:  Salome Arnt, LCSW Phone Number: 02/18/2021, 1:42 PM   Clinical Narrative:  Pt returning to [facility] today. Pt's nephew and facility aware and agreeable. LCSW discussed home health recommendation with Baker Janus at [facility]. They request [facility]. However, [facility] unable to accept due to insurance. Referred to [facility] for RN/PT. Facility notified. D/C summary and FL2 faxed. Facility will pick up pt this afternoon.      Final next level of care: Assisted Living Barriers to Discharge: Barriers Resolved   Patient Goals and CMS Choice        Discharge Placement                Patient to be transferred to facility by: [facility] staff Name of family member notified: [name]- nephew Patient and family notified of of transfer: 02/18/21  Discharge Plan and Services                DME Arranged: N/A DME Agency: NA       HH Arranged: RN, PT [place] Agency: [facility] ([place]) Date [place]: 02/18/21 Time [place]: [place] Representative spoke with at [place]: [place] ([place]) Interventions     Readmission Risk Interventions No flowsheet data found.

## 2021-02-18 NOTE — Care Management Important Message (Signed)
Important Message  Patient Details  Name: Marc Jimenez MRN: [id number] Date of Birth: August 17, 1951   Medicare Important Message Given:  Yes     Tommy Medal 02/18/2021, 12:00 PM

## 2021-02-18 NOTE — Progress Notes (Signed)
4 Days Post-Op  Subjective: Patient has no complaints.  Is eating and moving his bowels.  Objective: Vital signs in last 24 hours: Temp:  [97.6 F (36.4 C)-98.5 F (36.9 C)] 97.6 F (36.4 C) (06/14 0521) Pulse Rate:  [69-71] 69 (06/14 0521) Resp:  [16-17] 16 (06/14 0521) BP: (128-139)/(78-88) 128/88 (06/14 0521) SpO2:  [96 %-99 %] 99 % (06/14 0521) Last BM Date: 02/13/21  Intake/Output from previous day: 06/13 0701 - 06/14 0700 In: 840 [P.O.:840] Out: 200 [Urine:200] Intake/Output this shift: No intake/output data recorded.  General appearance: alert and no distress GI: Soft, incision healing well.  Bowel sounds active.  Lab Results:  Recent Labs    02/17/21 0432  WBC 6.3  HGB 13.0  HCT 38.5*  PLT 208   BMET Recent Labs    02/17/21 0432  NA 137  K 3.7  CL 107  CO2 26  GLUCOSE 87  BUN 13  CREATININE 0.92  CALCIUM 8.7*   PT/INR No results for input(s): LABPROT, INR in the last 72 hours.  Studies/Results: No results found.  Anti-infectives: Anti-infectives (From admission, onward)    Start     Dose/Rate Route Frequency Ordered Stop   02/14/21 0600  ertapenem (INVANZ) 1,000 mg in sodium chloride 0.9 % 100 mL IVPB        1 g 200 mL/hr over 30 Minutes Intravenous On call to O.R. 02/13/21 1819 02/14/21 2143   02/13/21 0945  neomycin (MYCIFRADIN) tablet 1,000 mg        1,000 mg Oral Every 8 hours 02/13/21 0855 02/13/21 2048   02/13/21 0945  metroNIDAZOLE (FLAGYL) tablet 500 mg        500 mg Oral Every 8 hours 02/13/21 0855 02/13/21 2048       Assessment/Plan: Impression: Stable on postoperative day 4.  Okay for discharge from surgery standpoint.  I would like to see the patient back in the office on 02/27/2021 for staple removal.  LOS: 8 days    [facility]iva Signs 02/18/2021

## 2021-02-18 NOTE — Progress Notes (Signed)
Nsg Discharge Note  Admit Date:  02/10/2021 Discharge date: 02/18/2021   [facility] to be D[facility]C'd Nursing Home per MD order.  AVS completed.  Copy for chart, and copy for patient signed, and dated. Patient/caregiver able to verbalize understanding.  Discharge Medication: Allergies as of 02/18/2021   No Known Allergies      Medication List     STOP taking these medications    chlordiazePOXIDE 25 MG capsule Commonly known as: LIBRIUM   sulfamethoxazole-trimethoprim 800-160 MG tablet Commonly known as: BACTRIM DS       TAKE these medications    amLODipine 5 MG tablet Commonly known as: NORVASC Take 1 tablet by mouth daily.   folic acid 1 MG tablet Commonly known as: FOLVITE Take 1 tablet (1 mg total) by mouth daily.   furosemide 20 MG tablet Commonly known as: LASIX Take 10 mg by mouth daily.   nicotine 21 mg/24hr patch Commonly known as: NICODERM CQ - dosed in mg/24 hours Place 1 patch (21 mg total) onto the skin daily.   oxyCODONE 5 MG immediate release tablet Commonly known as: Oxy IR/ROXICODONE Take 1-2 tablets (5-10 mg total) by mouth every 8 (eight) hours as needed for severe pain.   QUEtiapine 25 MG tablet Commonly known as: SEROQUEL Take 0.5 tablets (12.5 mg total) by mouth at bedtime.   tamsulosin 0.4 MG Caps capsule Commonly known as: FLOMAX Take 0.4 mg by mouth daily.   thiamine 100 MG tablet Take 1 tablet (100 mg total) by mouth daily.               Discharge Care Instructions  (From admission, onward)           Start     Ordered   02/18/21 0000  Discharge wound care:       Comments: Keep area clean and dry; staples to be removed by general surgery at follow up visit.   02/18/21 0906            Discharge Assessment: Vitals:   02/18/21 0521 02/18/21 1344  BP: 128/88 (!) 148/86  Pulse: 69 72  Resp: 16 17  Temp: 97.6 F (36.4 C) 97.8 F (36.6 C)  SpO2: 99% 100%   Skin clean, dry and intact without evidence of skin  break down, no evidence of skin tears noted. IV catheter discontinued intact. Site without signs and symptoms of complications - no redness or edema noted at insertion site, patient denies c/o pain - only slight tenderness at site.  Dressing with slight pressure applied.  D/c Instructions-Education: Discharge instructions given to patient/family with verbalized understanding. D/c education completed with patient/family including follow up instructions, medication list, d/c activities limitations if indicated, with other d/c instructions as indicated by MD - patient able to verbalize understanding, all questions fully answered. Patient instructed to return to ED, call 911, or call MD for any changes in condition.  Patient escorted via [place], and D/C home via private auto.  Dorcas Mcmurray, RN 02/18/2021 6:44 PM

## 2021-02-18 NOTE — NC FL2 (Signed)
[place] LEVEL OF CARE SCREENING TOOL     IDENTIFICATION  Patient Name: Marc Jimenez Birthdate: 06-08-1951 Sex: male Admission Date (Current Location): 02/10/2021  [facility] and [place] Number:  [employer] and Address:  [place] [address] [place]      Provider Number: [id number]  Attending Physician Name and Address:  Barton Dubois, MD  Relative Name and Phone Number:       Current Level of Care: Other (Comment) (ALF) Recommended Level of Care: Assisted Living Facility Prior Approval Number:    Date Approved/Denied:   PASRR Number:    Discharge Plan: Other (Comment) (ALF)    Current Diagnoses: Patient Active Problem List   Diagnosis Date Noted   Preoperative cardiovascular examination    Typical atrial flutter (HCC)    Sigmoid volvulus ([place]) 02/10/2021   Alcohol abuse 02/10/2021   HTN (hypertension) 02/10/2021    Orientation RESPIRATION BLADDER Height & Weight     Self, Place  Normal Continent Weight: 138 lb 3.7 oz (62.7 kg) Height:  5\' 7"  (170.2 cm)  BEHAVIORAL SYMPTOMS/MOOD NEUROLOGICAL BOWEL NUTRITION STATUS      Incontinent Diet (low sodium heart healthy)  AMBULATORY STATUS COMMUNICATION OF NEEDS Skin   Limited Assist Verbally Surgical wounds                       Personal Care Assistance Level of Assistance  Bathing, Dressing, Feeding Bathing Assistance: Limited assistance Feeding assistance: Limited assistance Dressing Assistance: Limited assistance     Functional Limitations Info  Sight, Hearing, Speech Sight Info: Impaired Hearing Info: Adequate Speech Info: Adequate    SPECIAL CARE FACTORS FREQUENCY  PT (By licensed PT)     PT Frequency: 3x weekly              Contractures      Additional Factors Info  Code Status, Allergies, Psychotropic Code Status Info: Full code Allergies Info: No known allergies Psychotropic Info: Lybrium         Current Medications  (02/18/2021):  This is the current hospital active medication list Current Facility-Administered Medications  Medication Dose Route Frequency Provider Last Rate Last Admin   acetaminophen (TYLENOL) tablet 650 mg  650 mg Oral Q6H PRN Aviva Signs, MD   650 mg at 02/16/21 1435   Or   acetaminophen (TYLENOL) suppository 650 mg  650 mg Rectal Q6H PRN Aviva Signs, MD       amLODipine (NORVASC) tablet 2.5 mg  2.5 mg Oral Daily Aviva Signs, MD   2.5 mg at 02/18/21 1016   Chlorhexidine Gluconate Cloth 2 % PADS 6 each  6 each Topical Daily Emokpae, Courage, MD   6 each at 02/18/21 1021   enoxaparin (LOVENOX) injection 40 mg  40 mg Subcutaneous Q24H Aviva Signs, MD   40 mg at 75/10/25 8527   folic acid (FOLVITE) tablet 1 mg  1 mg Oral Daily Aviva Signs, MD   1 mg at 02/18/21 1015   HYDROmorphone (DILAUDID) injection 1 mg  1 mg Intravenous Q3H PRN Aviva Signs, MD       labetalol (NORMODYNE) injection 10 mg  10 mg Intravenous Q4H PRN Denton Brick, Courage, MD   10 mg at 02/14/21 1815   LORazepam (ATIVAN) injection 1 mg  1 mg Intravenous Q4H PRN Aviva Signs, MD   1 mg at 02/16/21 0032   multivitamin with minerals tablet 1 tablet  1 tablet Oral Daily Aviva Signs, MD  1 tablet at 02/18/21 1015   nicotine (NICODERM CQ - dosed in mg/24 hours) patch 21 mg  21 mg Transdermal Daily Aviva Signs, MD   21 mg at 02/18/21 1019   ondansetron (ZOFRAN) tablet 4 mg  4 mg Oral Q6H PRN Aviva Signs, MD       Or   ondansetron [facility]) injection 4 mg  4 mg Intravenous Q6H PRN Aviva Signs, MD       oxyCODONE (Oxy IR/ROXICODONE) immediate release tablet 5-10 mg  5-10 mg Oral Q4H PRN Aviva Signs, MD   5 mg at 02/16/21 1435   QUEtiapine (SEROQUEL) tablet 12.5 mg  12.5 mg Oral QHS Barton Dubois, MD   12.5 mg at 02/17/21 2153   simethicone (MYLICON) chewable tablet 40 mg  40 mg Oral Q6H PRN Aviva Signs, MD       thiamine tablet 100 mg  100 mg Oral Daily Aviva Signs, MD   100 mg at 02/18/21 1015   Or   thiamine  (B-1) injection 100 mg  100 mg Intravenous Daily Aviva Signs, MD   100 mg at 02/12/21 1041     Discharge Medications: Medication List       STOP taking these medications     chlordiazePOXIDE 25 MG capsule Commonly known as: LIBRIUM    sulfamethoxazole-trimethoprim 800-160 MG tablet Commonly known as: BACTRIM DS           TAKE these medications     amLODipine 5 MG tablet Commonly known as: NORVASC Take 1 tablet by mouth daily.    folic acid 1 MG tablet Commonly known as: FOLVITE Take 1 tablet (1 mg total) by mouth daily.    furosemide 20 MG tablet Commonly known as: LASIX Take 10 mg by mouth daily.    nicotine 21 mg/24hr patch Commonly known as: NICODERM CQ - dosed in mg/24 hours Place 1 patch (21 mg total) onto the skin daily.    oxyCODONE 5 MG immediate release tablet Commonly known as: Oxy IR/ROXICODONE Take 1-2 tablets (5-10 mg total) by mouth every 8 (eight) hours as needed for severe pain.    QUEtiapine 25 MG tablet Commonly known as: SEROQUEL Take 0.5 tablets (12.5 mg total) by mouth at bedtime.    tamsulosin 0.4 MG Caps capsule Commonly known as: FLOMAX Take 0.4 mg by mouth daily.    thiamine 100 MG tablet Take 1 tablet (100 mg total) by mouth daily.            Relevant Imaging Results:  Relevant Lab Results:   Additional Information    Salome Arnt, LCSW

## 2021-02-18 NOTE — Discharge Summary (Signed)
Physician Discharge Summary  Marc Jimenez WEX:[id number] DOB: 1951-04-12 DOA: 02/10/2021  PCP: [name], MD  Admit date: 02/10/2021 Discharge date: 02/18/2021  Time spent: 35 minutes  Recommendations for Outpatient Follow-up:  Repeat basic metabolic panel to follow electrolytes and renal function Outpatient follow-up with general surgery for staples removal and to follow up post operative recovery.  Reassess blood pressure and adjust antihypertensive treatment as needed.   Discharge Diagnoses:  Principal Problem:   Sigmoid volvulus ([place]) Active Problems:   Alcohol abuse   HTN (hypertension)   Preoperative cardiovascular examination   Typical atrial flutter ([place])   Discharge Condition: Stable and improved.  Patient discharged with instructions to follow-up with PCP and general surgeon as an outpatient.   CODE STATUS: Full code  Diet recommendation: Heart healthy diet   Filed Weights   02/10/21 1500 02/10/21 2238  Weight: 62.6 kg 62.7 kg    History of present illness:  As per H&P written by Dr. Denton Brick on 02/10/21 Marc Jimenez is a 70 y.o. male with medical history significant for alcohol abuse, narcolepsy, hypertension, heart disease. Patient is a poor historian.  Patient presented to the ED with complaints of increasing abdominal pain over the past few days, decreased bowel movements and worsening poor appetite.  He denies vomiting.  No chest pain.  He agrees this has happened before, but he cannot give any details.   Per care everywhere - patient was admitted in March- 3/2 - 3/4 2022 this year, with similar symptoms, at [facility], CT showed sigmoid volvulus , he was promptly taken for endoscopic decompression which was successful 3/2.  Plan was for patient to undergo bowel prep followed by an open sigmoidectomy to prevent recurrence of the sigmoid volvulus.  Patient had cardiac evaluation, and was determined to be moderate to severe risk for perioperative  complications.  Patient did not take the bowel prep, and as this put him at potential risk for postop wound complications as well as anastomotic leak, surgery was canceled, and he was discharged to follow-up in 2 to 3 weeks for consideration of sigmoidectomy and possible further cardiac work-up to stratify his risk.   ED Course: Stable vitals.  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  This appearance is recurrent but improved when compared to prior. EDP talked to general surgery and gastroenterologist on-call, Dr. Arnoldo Morale and Dr. Abbey Chatters, no need for emergent decompression, recommended pain control IV fluids n.p.o. midnight.  Will see in consult.   Hospital Course:  1)Recurrent Sigmoid Colon Volvulus---Had prior colonoscopic reduction in March 2022 -- Patient previously refused surgical intervention -Status post flex sig on 02/12/2021 with reduction of volvulus -Postprocedure KUB shows no acute abnormalities. -s/p Partial colectomy by general surgery on 02/14/21 -Discussed with Dr Arnoldo Morale 02/18/21; plan is for patient to follow up as an outpatient with general surgery and to continue heart healthy diet as tolerated. Advised to maintain adequate hydration. to further advance diet as tolerated.  -positive BM reported; patient continue passing flatus.   2)AKI----acute kidney injury --due to dehydration in the setting of poor oral intake due to volvulus and Lasix use Creatinine 1.38 on admission -improved/resolved and back to normal at discharge, Cr 0.9 -repeat BMET to assess electrolytes and renal function stability.   3)Etoh Abuse -PRN lorazepam per CIWA protocol use while inpatient -cessation counseling provided  -Continue folic acid and thiamine -currently no demonstrating active withdrawal.   4)HTN-IV  -stable and well controlled -continue and resume home  antihypertensive regimen -heart healthy diet recommended.   5)FEN-IV dextrose until oral intake  is more reliable. -per surgery diet advanced as tolerated and well tolerated. -continue to maintain adequate hydration.   6)Social/Ethics/hospital acquired delirium--patient is a poor historian, intermittent episodes of agitation and hallucinations reported. -Patient is a full code -Patient was cooperative and appropriate today. -continue low dose of daily seroquel.   7)Persistent atrial flutter with 4-1 block--- rate appears controlled, CHA2DS2-VASc score is 3 -Not a candidate for anticoagulation due to being noncompliant. -Cardiology input appreciated -Continue outpatient follow-up with cardiology service.   8)Generalized Weakness/Ambulatory dysfunction--- at risk of falling. -Physical therapy has seen patient and reported minimal assistance and minimal guarding with ambulation and transfer. -Patient will be discharged back to assisted living facility with home health PT and home health RN orders.  Procedures: See below for x-ray reports.   S/P partial colectomy by general surgery on 02/14/21  Consultations: General surgery   Discharge Exam: Vitals:   02/17/21 2128 02/18/21 0521  BP: 139/88 128/88  Pulse: 71 69  Resp: 17 16  Temp: 98.5 F (36.9 C) 97.6 F (36.4 C)  SpO2: 98% 99%   General exam: Alert, awake, oriented x 3; in no acute distress during my evaluation.  Following commands appropriately and in not distress. Expressed no chest pain, no nausea, no vomiting, no abdominal pain and no SOB.  Patient continue moving his bowels and passing gas. Respiratory system: Clear to auscultation. Respiratory effort normal. Cardiovascular system:RRR. No murmurs, rubs or gallops. Gastrointestinal system: Abdomen is nondistended, soft and nontender on palpation.  Abdomen: Without any pain on transmission; staples/wound clean dry and intact. Central nervous system: Alert and oriented. No focal neurological deficits. Extremities: No C/C/E, +pedal pulses Skin: No rashes, lesions or  ulcers Psychiatry: no hallucinations, no agitation.   Discharge Instructions   Discharge Instructions     Diet - low sodium heart healthy   Complete by: As directed    Discharge instructions   Complete by: As directed    Continue to maintain adequate hydration. Outpatient follow up with general surgery as instructed. Take medications as prescribed. Follow heart healthy diet.   Discharge wound care:   Complete by: As directed    Keep area clean and dry; staples to be removed by general surgery at follow up visit.   Increase activity slowly   Complete by: As directed       Allergies as of 02/18/2021   No Known Allergies      Medication List     STOP taking these medications    chlordiazePOXIDE 25 MG capsule Commonly known as: LIBRIUM   sulfamethoxazole-trimethoprim 800-160 MG tablet Commonly known as: BACTRIM DS       TAKE these medications    amLODipine 5 MG tablet Commonly known as: NORVASC Take 1 tablet by mouth daily.   folic acid 1 MG tablet Commonly known as: FOLVITE Take 1 tablet (1 mg total) by mouth daily.   furosemide 20 MG tablet Commonly known as: LASIX Take 10 mg by mouth daily.   nicotine 21 mg/24hr patch Commonly known as: NICODERM CQ - dosed in mg/24 hours Place 1 patch (21 mg total) onto the skin daily.   oxyCODONE 5 MG immediate release tablet Commonly known as: Oxy IR/ROXICODONE Take 1-2 tablets (5-10 mg total) by mouth every 8 (eight) hours as needed for severe pain.   QUEtiapine 25 MG tablet Commonly known as: SEROQUEL Take 0.5 tablets (12.5 mg total) by mouth at bedtime.  tamsulosin 0.4 MG Caps capsule Commonly known as: FLOMAX Take 0.4 mg by mouth daily.   thiamine 100 MG tablet Take 1 tablet (100 mg total) by mouth daily.               Discharge Care Instructions  (From admission, onward)           Start     Ordered   02/18/21 0000  Discharge wound care:       Comments: Keep area clean and dry; staples  to be removed by general surgery at follow up visit.   02/18/21 0906           No Known Allergies  Follow-up Information     [name], MD. Schedule an appointment as soon as possible for a visit in 10 day(s).   Specialty: Internal Medicine Contact information: [address] [place] [address] [phone]         Aviva Signs, MD. Schedule an appointment as soon as possible for a visit today.   Specialty: General Surgery Why: office will contact you with appointment details. Contact information: [address] [place] [address] [phone]                  The results of significant diagnostics from this hospitalization (including imaging, microbiology, ancillary and laboratory) are listed below for reference.    Significant Diagnostic Studies: CT ABDOMEN PELVIS WO CONTRAST  Result Date: 02/10/2021 CLINICAL DATA:  Abdominal pain, constipation EXAM: CT ABDOMEN AND PELVIS WITHOUT CONTRAST TECHNIQUE: Multidetector CT imaging of the abdomen and pelvis was performed following the standard protocol without IV contrast. COMPARISON:  11/06/2020 FINDINGS: Lower chest: Lung bases are essentially clear. Hepatobiliary: Unenhanced liver is unremarkable. Gallbladder is unremarkable. No intrahepatic or extrahepatic ductal dilatation. Pancreas: Within normal limits. Spleen: Within normal limits. Adrenals/Urinary Tract: 19 mm left adrenal adenoma (series 2/image 19). Right adrenal gland is within normal limits. Two right renal cysts measuring up to 4.0 cm in the right lower pole (series 2/image 30). Left kidney is within normal limits. No renal calculi or hydronephrosis. Mildly thick-walled bladder, although underdistended. Stomach/Bowel: Stomach is within normal limits. No evidence of small bowel obstruction. Dilated sigmoid colon with narrowing in the central mesenteric root (series 2/image 44). This is recurrent but less severe when compared to the prior.  Sigmoid colon measures up to 6.2 cm on the current study, without wall thickening or mesenteric edema. Vascular/Lymphatic: No evidence of abdominal aortic aneurysm. Atherosclerotic calcifications of the abdominal aorta and branch vessels. No suspicious abdominopelvic lymphadenopathy. Reproductive: Prostatomegaly, suggesting BPH. Other: No abdominopelvic ascites. Musculoskeletal: Degenerative changes of the visualized thoracolumbar spine. 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 This appearance is recurrent but improved when compared to the prior. Additional ancillary findings as above. Electronically Signed   By: Julian Hy M.D.   On: 02/10/2021 19:39   DG Abd 1 View  Result Date: 02/12/2021 CLINICAL DATA:  Volvulus status post sigmoidoscopy. EXAM: ABDOMEN - 1 VIEW COMPARISON:  CT of February 10, 2021.  Radiograph of November 07, 2020. FINDINGS: Large amount of contrast and stool is seen in what appears to be the right colon. No significant bowel dilatation is noted currently. No abnormal calcifications are noted. IMPRESSION: No significant abnormal bowel dilatation is seen currently. Electronically Signed   By: Marijo Conception M.D.   On: 02/12/2021 14:53   ECHOCARDIOGRAM COMPLETE  Result Date: 02/13/2021    ECHOCARDIOGRAM REPORT   Patient Name:  Richardson Landry Date of Exam: 02/13/2021 Medical Rec #:  [id number]        Height:       67.0 in Accession #:    [id number]       Weight:       138.2 lb Date of Birth:  21-Feb-1951         BSA:          1.728 m Patient Age:    37 years         BP:           148/80 mmHg Patient Gender: M                HR:           61 bpm. Exam Location:  [facility] Procedure: 2D Echo, Cardiac Doppler and Color Doppler Indications:    Atrial Flutter I48.92  History:        Patient has no prior history of Echocardiogram examinations.                 Risk Factors:Hypertension. Alcohol abuse.  Sonographer:    Alvino Chapel RCS Referring Phys: [id number] [place]  1. Left ventricular ejection fraction, by estimation, is 60 to 65%. The left ventricle has normal function. The left ventricle has no regional wall motion abnormalities. There is moderate left ventricular hypertrophy. Left ventricular diastolic parameters are indeterminate.  2. Right ventricular systolic function is normal. The right ventricular size is normal. There is normal pulmonary artery systolic pressure. The estimated right ventricular systolic pressure is 49.6 mmHg.  3. Left atrial size was mild to moderately dilated.  4. Right atrial size was mildly dilated.  5. The mitral valve is grossly normal. Mild mitral valve regurgitation.  6. The aortic valve is tricuspid. Aortic valve regurgitation is mild. Aortic regurgitation PHT measures 719 msec.  7. The inferior vena cava is normal in size with greater than 50% respiratory variability, suggesting right atrial pressure of 3 mmHg. FINDINGS  Left Ventricle: Left ventricular ejection fraction, by estimation, is 60 to 65%. The left ventricle has normal function. The left ventricle has no regional wall motion abnormalities. The left ventricular internal cavity size was normal in size. There is  moderate left ventricular hypertrophy. Left ventricular diastolic parameters are indeterminate. Right Ventricle: The right ventricular size is normal. No increase in right ventricular wall thickness. Right ventricular systolic function is normal. There is normal pulmonary artery systolic pressure. The tricuspid regurgitant velocity is 2.45 m/s, and  with an assumed right atrial pressure of 3 mmHg, the estimated right ventricular systolic pressure is 75.9 mmHg. Left Atrium: Left atrial size was mild to moderately dilated. Right Atrium: Right atrial size was mildly dilated. Pericardium: There is no evidence of pericardial effusion. Mitral Valve: The mitral valve is grossly normal. There is mild thickening of the mitral valve leaflet(s). Mild mitral valve  regurgitation. Tricuspid Valve: The tricuspid valve is grossly normal. Tricuspid valve regurgitation is mild. Aortic Valve: The aortic valve is tricuspid. Aortic valve regurgitation is mild. Aortic regurgitation PHT measures 719 msec. Pulmonic Valve: The pulmonic valve was grossly normal. Pulmonic valve regurgitation is trivial. Aorta: The aortic root is normal in size and structure. Venous: The inferior vena cava is normal in size with greater than 50% respiratory variability, suggesting right atrial pressure of 3 mmHg. IAS/Shunts: No atrial level shunt detected by color flow Doppler.  LEFT VENTRICLE PLAX 2D LVIDd:  4.20 cm  Diastology LVIDs:         2.70 cm  LV e' medial:    8.27 cm/s LV PW:         1.50 cm  LV E/e' medial:  9.3 LV IVS:        1.30 cm  LV e' lateral:   11.40 cm/s LVOT diam:     2.00 cm  LV E/e' lateral: 6.8 LV SV:         53 LV SV Index:   31 LVOT Area:     3.14 cm  RIGHT VENTRICLE RV S prime:     12.80 cm/s TAPSE (M-mode): 2.0 cm LEFT ATRIUM             Index       RIGHT ATRIUM           Index LA diam:        3.40 cm 1.97 cm/m  RA Area:     21.80 cm LA Vol (A2C):   73.5 ml 42.52 ml/m RA Volume:   63.00 ml  36.45 ml/m LA Vol (A4C):   73.4 ml 42.47 ml/m LA Biplane Vol: 73.4 ml 42.47 ml/m  AORTIC VALVE LVOT Vmax:   86.95 cm/s LVOT Vmean:  55.500 cm/s LVOT VTI:    0.168 m AI PHT:      719 msec  AORTA Ao Root diam: 3.70 cm MITRAL VALVE               TRICUSPID VALVE MV Area (PHT): 4.80 cm    TR Peak grad:   24.0 mmHg MV Decel Time: 158 msec    TR Vmax:        245.00 cm/s MV E velocity: 77.10 cm/s MV A velocity: 55.50 cm/s  SHUNTS MV E/A ratio:  1.39        Systemic VTI:  0.17 m                            Systemic Diam: 2.00 cm Rozann Lesches MD Electronically signed by Rozann Lesches MD Signature Date/Time: 02/13/2021/12:50:35 PM    Final     Microbiology: Recent Results (from the past 240 hour(s))  SARS CORONAVIRUS 2 (TAT 6-24 HRS) Nasopharyngeal Nasopharyngeal Swab     Status: None    Collection Time: 02/10/21  9:24 PM   Specimen: Nasopharyngeal Swab  Result Value Ref Range Status   SARS Coronavirus 2 NEGATIVE NEGATIVE Final    Comment: (NOTE) SARS-CoV-2 target nucleic acids are NOT DETECTED.  The SARS-CoV-2 RNA is generally detectable in upper and lower respiratory specimens during the acute phase of infection. Negative results do not preclude SARS-CoV-2 infection, do not rule out co-infections with other pathogens, and should not be used as the sole basis for treatment or other patient management decisions. Negative results must be combined with clinical observations, patient history, and epidemiological information. The expected result is Negative.  Fact Sheet for Patients: SugarRoll.be  Fact Sheet for Healthcare Providers: https://www.woods-mathews.com/  This test is not yet approved or cleared by the [place] FDA and  has been authorized for detection and/or diagnosis of SARS-CoV-2 by FDA under an Emergency Use Authorization (EUA). This EUA will remain  in effect (meaning this test can be used) for the duration of the COVID-19 declaration under Se ction 564(b)(1) of the Act, 21 U.S.C. section 360bbb-3(b)(1), unless the authorization is terminated or revoked sooner.  Performed at [facility], [address] [place], [place] [address]   Surgical pcr screen     Status: None   Collection Time: 02/14/21  1:57 AM   Specimen: Nasal Mucosa; Nasal Swab  Result Value Ref Range Status   MRSA, PCR NEGATIVE NEGATIVE Final   Staphylococcus aureus NEGATIVE NEGATIVE Final    Comment: (NOTE) The Xpert SA Assay (FDA approved for NASAL specimens in patients 25 years of age and older), is one component of a comprehensive surveillance program. It is not intended to diagnose infection nor to guide or monitor treatment. Performed at [facility], [address] [place], [place] [address]      Labs: Basic  Metabolic Panel: Recent Labs  Lab 02/13/21 0606 02/14/21 0617 02/15/21 0424 02/17/21 0432  NA 137 137 134* 137  K 4.0 3.8 4.1 3.7  CL 109 108 101 107  CO2 24 24 26 26   GLUCOSE 116* 90 128* 87  BUN 6* 7* 9 13  CREATININE 0.90 0.92 0.97 0.92  CALCIUM 8.6* 8.6* 8.8* 8.7*  MG 1.9  --  1.7  --   PHOS 2.9  --  3.2  --    Liver Function Tests: Recent Labs  Lab 02/13/21 0606  AST 15  ALT 17  ALKPHOS 62  BILITOT 0.6  PROT 5.8*  ALBUMIN 3.3*   No results for input(s): LIPASE, AMYLASE in the last 168 hours. No results for input(s): AMMONIA in the last 168 hours. CBC: Recent Labs  Lab 02/13/21 0606 02/14/21 0617 02/15/21 0424 02/17/21 0432  WBC 4.7 5.1 11.0* 6.3  HGB 13.1 13.4 13.5 13.0  HCT 38.9* 40.1 39.5 38.5*  MCV 94.2 94.1 92.5 93.4  PLT 174 187 205 208   Cardiac Enzymes: No results for input(s): CKTOTAL, CKMB, CKMBINDEX, TROPONINI in the last 168 hours. BNP: BNP (last 3 results) No results for input(s): BNP in the last 8760 hours.  ProBNP (last 3 results) No results for input(s): PROBNP in the last 8760 hours.  CBG: No results for input(s): GLUCAP in the last 168 hours.     Signed:  Barton Dubois MD.  [facility] 02/18/2021, 9:25 AM

## 2021-02-18 NOTE — Evaluation (Addendum)
Physical Therapy Evaluation Patient Details Name: SHAHEEN MENDE MRN: [id number] DOB: [birth year] Today's Date: 02/18/2021   History of Present Illness  MYLO CHOI is a 70 y.o. male presents with c/o increasing abdominal pain, decreased bowel movements and worsening poor appetite. 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 Pt is s/p flex sigmoidectomy on 02/12/2021 with reduction of volvulus. Pt is s/p s/p sigmoid colectomy for volvulus on 6/10. PMH: alcohol abuse, narcolepsy, hypertension, heart disease.   Clinical Impression  Pt admitted with above diagnosis. Pt states he walks with a SPC to the dining hall, has roommates and "people" assist him with bathing and dressing, but unable to elaborate; per nursing pt from [facility]. Pt currently requiring min A to steady with transfers and min guard with limited ambulation, denies pain. Pt instructed on BLE strengthening exercises with good performance and educated to perform additional sets/reps while up in chair and verbalized agreement. Pt currently with functional limitations due to the deficits listed below (see PT Problem List). Pt will benefit from skilled PT to increase their independence and safety with mobility to allow discharge to the venue listed below.       Follow Up Recommendations Home health PT (back to ALF)    Equipment Recommendations  None recommended by PT    Recommendations for Other Services       Precautions / Restrictions Precautions Precautions: Fall Restrictions Weight Bearing Restrictions: No      Mobility  Bed Mobility Overal bed mobility: Needs Assistance Bed Mobility: Supine to Sit  Supine to sit: Min guard  General bed mobility comments: slow to mobilize, reliant on UEs to assist    Transfers Overall transfer level: Needs assistance Equipment used: Rolling walker (2 wheeled) Transfers: Sit to/from Stand Sit to Stand: Min assist  General transfer comment:  min A to steady with powering to stand due to posterior weight, once standing good weight shift anterior and steady on feet  Ambulation/Gait Ambulation/Gait assistance: Min assist Gait Distance (Feet): 20 Feet Assistive device: Rolling walker (2 wheeled) Gait Pattern/deviations: Step-through pattern;Decreased stride length;Narrow base of support;Trunk flexed Gait velocity: decreased   General Gait Details: pt ambulates in room with narrow BOS and trunk slightly flexed forward, slightly unsteady without LOB, denies abdominal pain  Stairs            Wheelchair Mobility    Modified Rankin (Stroke Patients Only)       Balance Overall balance assessment: Needs assistance Sitting-balance support: Feet supported Sitting balance-Leahy Scale: Fair Sitting balance - Comments: seated EOB   Standing balance support: During functional activity;Bilateral upper extremity supported Standing balance-Leahy Scale: Poor Standing balance comment: reliant on UE support          Pertinent Vitals/Pain Pain Assessment: No/denies pain    Home Living Family/patient expects to be discharged to:: Skilled nursing facility    Prior Function Level of Independence: Needs assistance   Gait / Transfers Assistance Needed: Pt reports using SPC for ambulation, denies falls  ADL's / Homemaking Assistance Needed: Pt reports facility assists        Hand Dominance        Extremity/Trunk Assessment   Upper Extremity Assessment Upper Extremity Assessment: Overall WFL for tasks assessed    Lower Extremity Assessment Lower Extremity Assessment: Generalized weakness (AROM WNL, strength grossly 4-/5, denies numbness/tingling)    Cervical / Trunk Assessment Cervical / Trunk Assessment: Kyphotic  Communication      Cognition Arousal/Alertness: Awake/alert Behavior During  Therapy: WFL for tasks assessed/performed Overall Cognitive Status: No family/caregiver present to determine baseline  cognitive functioning  General Comments: Pt oriented to self and location. Pt states situation is "waiting for my nephew to pick me up". Pt requires increased time and cues for single step commands      General Comments      Exercises General Exercises - Lower Extremity Long Arc Quad: Seated;AROM;Strengthening;Both;10 reps Hip Flexion/Marching: Seated;AROM;Strengthening;Both;10 reps   Assessment/Plan    PT Assessment Patient needs continued PT services  PT Problem List Decreased strength;Decreased activity tolerance;Decreased balance;Decreased mobility       PT Treatment Interventions DME instruction;Gait training;Functional mobility training;Therapeutic activities;Therapeutic exercise;Balance training;Patient/family education    PT Goals (Current goals can be found in the Care Plan section)  Acute Rehab PT Goals Patient Stated Goal: pt agreeable to ambulate with therapy PT Goal Formulation: With patient Time For Goal Achievement: 03/04/21 Potential to Achieve Goals: Good    Frequency Min 3X/week   Barriers to discharge        Co-evaluation               AM-PAC PT "6 Clicks" Mobility  Outcome Measure Help needed turning from your back to your side while in a flat bed without using bedrails?: A Little Help needed moving from lying on your back to sitting on the side of a flat bed without using bedrails?: A Little Help needed moving to and from a bed to a chair (including a wheelchair)?: A Little Help needed standing up from a chair using your arms (e.g., wheelchair or bedside chair)?: A Little Help needed to walk in hospital room?: A Little Help needed climbing 3-5 steps with a railing? : A Little 6 Click Score: 18    End of Session Equipment Utilized During Treatment: Gait belt Activity Tolerance: Patient tolerated treatment well Patient left: in chair;with call bell/phone within reach;with chair alarm set Nurse Communication: Mobility status PT Visit  Diagnosis: Other abnormalities of gait and mobility (R26.89);Muscle weakness (generalized) (M62.81)    Time: 3295-1884 PT Time Calculation (min) (ACUTE ONLY): 24 min   Charges:   PT Evaluation $PT Eval Low Complexity: 1 Low PT Treatments $Therapeutic Exercise: 8-22 mins         Tori Zebulin Siegel PT, DPT 02/18/21, 12:34 PM

## 2021-02-18 NOTE — Plan of Care (Signed)
  Problem: Acute Rehab PT Goals(only PT should resolve) Goal: Pt Will Go Supine/Side To Sit Outcome: Progressing Flowsheets (Taken 02/18/2021 1238) Pt will go Supine/Side to Sit: with supervision Goal: Patient Will Transfer Sit To/From Stand Outcome: Progressing Flowsheets (Taken 02/18/2021 1238) Patient will transfer sit to/from stand: with min guard assist Goal: Pt Will Ambulate Outcome: Progressing Flowsheets (Taken 02/18/2021 1238) Pt will Ambulate:  > 125 feet  with min guard assist  with rolling walker Goal: Pt/caregiver will Perform Home Exercise Program Outcome: Progressing Flowsheets (Taken 02/18/2021 1238) Pt/caregiver will Perform Home Exercise Program:  For increased strengthening  For improved balance  With Supervision, verbal cues required/provided   Talbot Grumbling PT, DPT 02/18/21, 12:38 PM

## 2021-02-19 DIAGNOSIS — I4510 Unspecified right bundle-branch block: Secondary | ICD-10-CM | POA: Diagnosis not present

## 2021-02-19 DIAGNOSIS — I483 Typical atrial flutter: Secondary | ICD-10-CM | POA: Diagnosis not present

## 2021-02-19 DIAGNOSIS — Z48815 Encounter for surgical aftercare following surgery on the digestive system: Secondary | ICD-10-CM | POA: Diagnosis not present

## 2021-02-19 DIAGNOSIS — I119 Hypertensive heart disease without heart failure: Secondary | ICD-10-CM | POA: Diagnosis not present

## 2021-02-19 DIAGNOSIS — G47411 Narcolepsy with cataplexy: Secondary | ICD-10-CM | POA: Diagnosis not present

## 2021-02-19 DIAGNOSIS — H409 Unspecified glaucoma: Secondary | ICD-10-CM | POA: Diagnosis not present

## 2021-02-19 DIAGNOSIS — R69 Illness, unspecified: Secondary | ICD-10-CM | POA: Diagnosis not present

## 2021-02-19 DIAGNOSIS — Z9049 Acquired absence of other specified parts of digestive tract: Secondary | ICD-10-CM | POA: Diagnosis not present

## 2021-02-19 DIAGNOSIS — R531 Weakness: Secondary | ICD-10-CM | POA: Diagnosis not present

## 2021-02-19 LAB — SURGICAL PATHOLOGY

## 2021-02-20 DIAGNOSIS — R531 Weakness: Secondary | ICD-10-CM | POA: Diagnosis not present

## 2021-02-20 DIAGNOSIS — R69 Illness, unspecified: Secondary | ICD-10-CM | POA: Diagnosis not present

## 2021-02-20 DIAGNOSIS — I119 Hypertensive heart disease without heart failure: Secondary | ICD-10-CM | POA: Diagnosis not present

## 2021-02-20 DIAGNOSIS — Z48815 Encounter for surgical aftercare following surgery on the digestive system: Secondary | ICD-10-CM | POA: Diagnosis not present

## 2021-02-20 DIAGNOSIS — G47411 Narcolepsy with cataplexy: Secondary | ICD-10-CM | POA: Diagnosis not present

## 2021-02-20 DIAGNOSIS — H409 Unspecified glaucoma: Secondary | ICD-10-CM | POA: Diagnosis not present

## 2021-02-20 DIAGNOSIS — I483 Typical atrial flutter: Secondary | ICD-10-CM | POA: Diagnosis not present

## 2021-02-20 DIAGNOSIS — I4510 Unspecified right bundle-branch block: Secondary | ICD-10-CM | POA: Diagnosis not present

## 2021-02-20 DIAGNOSIS — Z9049 Acquired absence of other specified parts of digestive tract: Secondary | ICD-10-CM | POA: Diagnosis not present

## 2021-02-21 DIAGNOSIS — H409 Unspecified glaucoma: Secondary | ICD-10-CM | POA: Diagnosis not present

## 2021-02-21 DIAGNOSIS — Z48815 Encounter for surgical aftercare following surgery on the digestive system: Secondary | ICD-10-CM | POA: Diagnosis not present

## 2021-02-21 DIAGNOSIS — I119 Hypertensive heart disease without heart failure: Secondary | ICD-10-CM | POA: Diagnosis not present

## 2021-02-21 DIAGNOSIS — I4510 Unspecified right bundle-branch block: Secondary | ICD-10-CM | POA: Diagnosis not present

## 2021-02-21 DIAGNOSIS — Z9049 Acquired absence of other specified parts of digestive tract: Secondary | ICD-10-CM | POA: Diagnosis not present

## 2021-02-21 DIAGNOSIS — I483 Typical atrial flutter: Secondary | ICD-10-CM | POA: Diagnosis not present

## 2021-02-21 DIAGNOSIS — R531 Weakness: Secondary | ICD-10-CM | POA: Diagnosis not present

## 2021-02-21 DIAGNOSIS — R69 Illness, unspecified: Secondary | ICD-10-CM | POA: Diagnosis not present

## 2021-02-21 DIAGNOSIS — G47411 Narcolepsy with cataplexy: Secondary | ICD-10-CM | POA: Diagnosis not present

## 2021-02-25 DIAGNOSIS — H409 Unspecified glaucoma: Secondary | ICD-10-CM | POA: Diagnosis not present

## 2021-02-25 DIAGNOSIS — Z9049 Acquired absence of other specified parts of digestive tract: Secondary | ICD-10-CM | POA: Diagnosis not present

## 2021-02-25 DIAGNOSIS — Z20828 Contact with and (suspected) exposure to other viral communicable diseases: Secondary | ICD-10-CM | POA: Diagnosis not present

## 2021-02-25 DIAGNOSIS — U071 COVID-19: Secondary | ICD-10-CM | POA: Diagnosis not present

## 2021-02-25 DIAGNOSIS — I119 Hypertensive heart disease without heart failure: Secondary | ICD-10-CM | POA: Diagnosis not present

## 2021-02-25 DIAGNOSIS — I483 Typical atrial flutter: Secondary | ICD-10-CM | POA: Diagnosis not present

## 2021-02-25 DIAGNOSIS — Z48815 Encounter for surgical aftercare following surgery on the digestive system: Secondary | ICD-10-CM | POA: Diagnosis not present

## 2021-02-25 DIAGNOSIS — G47411 Narcolepsy with cataplexy: Secondary | ICD-10-CM | POA: Diagnosis not present

## 2021-02-25 DIAGNOSIS — I4510 Unspecified right bundle-branch block: Secondary | ICD-10-CM | POA: Diagnosis not present

## 2021-02-25 DIAGNOSIS — R531 Weakness: Secondary | ICD-10-CM | POA: Diagnosis not present

## 2021-02-25 DIAGNOSIS — R69 Illness, unspecified: Secondary | ICD-10-CM | POA: Diagnosis not present

## 2021-02-26 DIAGNOSIS — H409 Unspecified glaucoma: Secondary | ICD-10-CM | POA: Diagnosis not present

## 2021-02-26 DIAGNOSIS — I4510 Unspecified right bundle-branch block: Secondary | ICD-10-CM | POA: Diagnosis not present

## 2021-02-26 DIAGNOSIS — I119 Hypertensive heart disease without heart failure: Secondary | ICD-10-CM | POA: Diagnosis not present

## 2021-02-26 DIAGNOSIS — R69 Illness, unspecified: Secondary | ICD-10-CM | POA: Diagnosis not present

## 2021-02-26 DIAGNOSIS — I483 Typical atrial flutter: Secondary | ICD-10-CM | POA: Diagnosis not present

## 2021-02-26 DIAGNOSIS — G47411 Narcolepsy with cataplexy: Secondary | ICD-10-CM | POA: Diagnosis not present

## 2021-02-26 DIAGNOSIS — R531 Weakness: Secondary | ICD-10-CM | POA: Diagnosis not present

## 2021-02-26 DIAGNOSIS — Z9049 Acquired absence of other specified parts of digestive tract: Secondary | ICD-10-CM | POA: Diagnosis not present

## 2021-02-26 DIAGNOSIS — Z48815 Encounter for surgical aftercare following surgery on the digestive system: Secondary | ICD-10-CM | POA: Diagnosis not present

## 2021-02-27 ENCOUNTER — Ambulatory Visit (INDEPENDENT_AMBULATORY_CARE_PROVIDER_SITE_OTHER): Payer: Medicare HMO | Admitting: General Surgery

## 2021-02-27 ENCOUNTER — Other Ambulatory Visit: Payer: Self-pay

## 2021-02-27 ENCOUNTER — Encounter: Payer: Self-pay | Admitting: General Surgery

## 2021-02-27 VITALS — BP 172/90 | HR 67 | Temp 98.7°F | Resp 14 | Ht 67.0 in | Wt 136.0 lb

## 2021-02-27 DIAGNOSIS — Z48815 Encounter for surgical aftercare following surgery on the digestive system: Secondary | ICD-10-CM | POA: Diagnosis not present

## 2021-02-27 DIAGNOSIS — G47411 Narcolepsy with cataplexy: Secondary | ICD-10-CM | POA: Diagnosis not present

## 2021-02-27 DIAGNOSIS — H409 Unspecified glaucoma: Secondary | ICD-10-CM | POA: Diagnosis not present

## 2021-02-27 DIAGNOSIS — R69 Illness, unspecified: Secondary | ICD-10-CM | POA: Diagnosis not present

## 2021-02-27 DIAGNOSIS — I4510 Unspecified right bundle-branch block: Secondary | ICD-10-CM | POA: Diagnosis not present

## 2021-02-27 DIAGNOSIS — I483 Typical atrial flutter: Secondary | ICD-10-CM | POA: Diagnosis not present

## 2021-02-27 DIAGNOSIS — Z9049 Acquired absence of other specified parts of digestive tract: Secondary | ICD-10-CM | POA: Diagnosis not present

## 2021-02-27 DIAGNOSIS — Z09 Encounter for follow-up examination after completed treatment for conditions other than malignant neoplasm: Secondary | ICD-10-CM

## 2021-02-27 DIAGNOSIS — R531 Weakness: Secondary | ICD-10-CM | POA: Diagnosis not present

## 2021-02-27 DIAGNOSIS — I119 Hypertensive heart disease without heart failure: Secondary | ICD-10-CM | POA: Diagnosis not present

## 2021-02-27 NOTE — Progress Notes (Signed)
Subjective:     Marc Jimenez  Here for postoperative visit, status post partial colectomy for sigmoid volvulus.  Patient is doing well.  He resides in a nursing home.  No problems have been reported. Objective:    BP (!) 172/90   Pulse 67   Temp 98.7 F (37.1 C) (Other (Comment))   Resp 14   Ht 5\' 7"  (1.702 m)   Wt 136 lb (61.7 kg)   SpO2 93%   BMI 21.30 kg/m   General:  alert, cooperative, and no distress  Ambulates with a cane. Abdomen is soft, incision healing well.  Staples removed, Steri-Strips applied.     Assessment:    Doing well postoperatively.    Plan:   Follow-up here as needed.

## 2021-02-28 DIAGNOSIS — R69 Illness, unspecified: Secondary | ICD-10-CM | POA: Diagnosis not present

## 2021-02-28 DIAGNOSIS — Z48815 Encounter for surgical aftercare following surgery on the digestive system: Secondary | ICD-10-CM | POA: Diagnosis not present

## 2021-02-28 DIAGNOSIS — Z9049 Acquired absence of other specified parts of digestive tract: Secondary | ICD-10-CM | POA: Diagnosis not present

## 2021-02-28 DIAGNOSIS — G47411 Narcolepsy with cataplexy: Secondary | ICD-10-CM | POA: Diagnosis not present

## 2021-02-28 DIAGNOSIS — I483 Typical atrial flutter: Secondary | ICD-10-CM | POA: Diagnosis not present

## 2021-02-28 DIAGNOSIS — H409 Unspecified glaucoma: Secondary | ICD-10-CM | POA: Diagnosis not present

## 2021-02-28 DIAGNOSIS — I119 Hypertensive heart disease without heart failure: Secondary | ICD-10-CM | POA: Diagnosis not present

## 2021-02-28 DIAGNOSIS — R531 Weakness: Secondary | ICD-10-CM | POA: Diagnosis not present

## 2021-02-28 DIAGNOSIS — I4510 Unspecified right bundle-branch block: Secondary | ICD-10-CM | POA: Diagnosis not present

## 2021-03-03 DIAGNOSIS — Z6820 Body mass index (BMI) 20.0-20.9, adult: Secondary | ICD-10-CM | POA: Diagnosis not present

## 2021-03-03 DIAGNOSIS — I10 Essential (primary) hypertension: Secondary | ICD-10-CM | POA: Diagnosis not present

## 2021-03-03 DIAGNOSIS — I4510 Unspecified right bundle-branch block: Secondary | ICD-10-CM | POA: Diagnosis not present

## 2021-03-03 DIAGNOSIS — G47411 Narcolepsy with cataplexy: Secondary | ICD-10-CM | POA: Diagnosis not present

## 2021-03-03 DIAGNOSIS — H409 Unspecified glaucoma: Secondary | ICD-10-CM | POA: Diagnosis not present

## 2021-03-03 DIAGNOSIS — I483 Typical atrial flutter: Secondary | ICD-10-CM | POA: Diagnosis not present

## 2021-03-03 DIAGNOSIS — Z48815 Encounter for surgical aftercare following surgery on the digestive system: Secondary | ICD-10-CM | POA: Diagnosis not present

## 2021-03-03 DIAGNOSIS — N400 Enlarged prostate without lower urinary tract symptoms: Secondary | ICD-10-CM | POA: Diagnosis not present

## 2021-03-03 DIAGNOSIS — Z9049 Acquired absence of other specified parts of digestive tract: Secondary | ICD-10-CM | POA: Diagnosis not present

## 2021-03-03 DIAGNOSIS — R69 Illness, unspecified: Secondary | ICD-10-CM | POA: Diagnosis not present

## 2021-03-03 DIAGNOSIS — R531 Weakness: Secondary | ICD-10-CM | POA: Diagnosis not present

## 2021-03-03 DIAGNOSIS — K562 Volvulus: Secondary | ICD-10-CM | POA: Diagnosis not present

## 2021-03-03 DIAGNOSIS — I119 Hypertensive heart disease without heart failure: Secondary | ICD-10-CM | POA: Diagnosis not present

## 2021-03-04 DIAGNOSIS — U071 COVID-19: Secondary | ICD-10-CM | POA: Diagnosis not present

## 2021-03-04 DIAGNOSIS — Z20828 Contact with and (suspected) exposure to other viral communicable diseases: Secondary | ICD-10-CM | POA: Diagnosis not present

## 2021-03-05 DIAGNOSIS — G47411 Narcolepsy with cataplexy: Secondary | ICD-10-CM | POA: Diagnosis not present

## 2021-03-05 DIAGNOSIS — Z9049 Acquired absence of other specified parts of digestive tract: Secondary | ICD-10-CM | POA: Diagnosis not present

## 2021-03-05 DIAGNOSIS — I119 Hypertensive heart disease without heart failure: Secondary | ICD-10-CM | POA: Diagnosis not present

## 2021-03-05 DIAGNOSIS — I483 Typical atrial flutter: Secondary | ICD-10-CM | POA: Diagnosis not present

## 2021-03-05 DIAGNOSIS — H409 Unspecified glaucoma: Secondary | ICD-10-CM | POA: Diagnosis not present

## 2021-03-05 DIAGNOSIS — R69 Illness, unspecified: Secondary | ICD-10-CM | POA: Diagnosis not present

## 2021-03-05 DIAGNOSIS — Z48815 Encounter for surgical aftercare following surgery on the digestive system: Secondary | ICD-10-CM | POA: Diagnosis not present

## 2021-03-05 DIAGNOSIS — I4510 Unspecified right bundle-branch block: Secondary | ICD-10-CM | POA: Diagnosis not present

## 2021-03-05 DIAGNOSIS — R531 Weakness: Secondary | ICD-10-CM | POA: Diagnosis not present

## 2021-03-07 DIAGNOSIS — H25813 Combined forms of age-related cataract, bilateral: Secondary | ICD-10-CM | POA: Diagnosis not present

## 2021-03-07 DIAGNOSIS — H401133 Primary open-angle glaucoma, bilateral, severe stage: Secondary | ICD-10-CM | POA: Diagnosis not present

## 2021-03-11 DIAGNOSIS — Z20828 Contact with and (suspected) exposure to other viral communicable diseases: Secondary | ICD-10-CM | POA: Diagnosis not present

## 2021-03-11 DIAGNOSIS — U071 COVID-19: Secondary | ICD-10-CM | POA: Diagnosis not present

## 2021-03-18 DIAGNOSIS — Z20828 Contact with and (suspected) exposure to other viral communicable diseases: Secondary | ICD-10-CM | POA: Diagnosis not present

## 2021-03-18 DIAGNOSIS — U071 COVID-19: Secondary | ICD-10-CM | POA: Diagnosis not present

## 2021-03-25 DIAGNOSIS — U071 COVID-19: Secondary | ICD-10-CM | POA: Diagnosis not present

## 2021-03-25 DIAGNOSIS — Z20828 Contact with and (suspected) exposure to other viral communicable diseases: Secondary | ICD-10-CM | POA: Diagnosis not present

## 2021-04-01 DIAGNOSIS — U071 COVID-19: Secondary | ICD-10-CM | POA: Diagnosis not present

## 2021-04-01 DIAGNOSIS — Z20828 Contact with and (suspected) exposure to other viral communicable diseases: Secondary | ICD-10-CM | POA: Diagnosis not present

## 2021-04-03 ENCOUNTER — Other Ambulatory Visit: Payer: Medicare Other

## 2021-04-03 ENCOUNTER — Other Ambulatory Visit: Payer: Self-pay

## 2021-04-03 DIAGNOSIS — R972 Elevated prostate specific antigen [PSA]: Secondary | ICD-10-CM

## 2021-04-04 LAB — PSA, TOTAL AND FREE
PSA, Free Pct: 8.2 %
PSA, Free: 0.49 ng/mL
Prostate Specific Ag, Serum: 6 ng/mL — ABNORMAL HIGH (ref 0.0–4.0)

## 2021-04-08 DIAGNOSIS — Z20828 Contact with and (suspected) exposure to other viral communicable diseases: Secondary | ICD-10-CM | POA: Diagnosis not present

## 2021-04-08 DIAGNOSIS — U071 COVID-19: Secondary | ICD-10-CM | POA: Diagnosis not present

## 2021-04-10 ENCOUNTER — Encounter: Payer: Self-pay | Admitting: Urology

## 2021-04-10 ENCOUNTER — Ambulatory Visit (INDEPENDENT_AMBULATORY_CARE_PROVIDER_SITE_OTHER): Payer: Medicare Other | Admitting: Urology

## 2021-04-10 ENCOUNTER — Other Ambulatory Visit: Payer: Self-pay

## 2021-04-10 VITALS — BP 145/88 | HR 82

## 2021-04-10 DIAGNOSIS — N401 Enlarged prostate with lower urinary tract symptoms: Secondary | ICD-10-CM

## 2021-04-10 DIAGNOSIS — N138 Other obstructive and reflux uropathy: Secondary | ICD-10-CM | POA: Diagnosis not present

## 2021-04-10 DIAGNOSIS — N410 Acute prostatitis: Secondary | ICD-10-CM

## 2021-04-10 DIAGNOSIS — R3915 Urgency of urination: Secondary | ICD-10-CM | POA: Diagnosis not present

## 2021-04-10 DIAGNOSIS — Z87438 Personal history of other diseases of male genital organs: Secondary | ICD-10-CM

## 2021-04-10 DIAGNOSIS — R972 Elevated prostate specific antigen [PSA]: Secondary | ICD-10-CM | POA: Diagnosis not present

## 2021-04-10 LAB — URINALYSIS, ROUTINE W REFLEX MICROSCOPIC
Bilirubin, UA: NEGATIVE
Glucose, UA: NEGATIVE
Ketones, UA: NEGATIVE
Leukocytes,UA: NEGATIVE
Nitrite, UA: NEGATIVE
Protein,UA: NEGATIVE
RBC, UA: NEGATIVE
Specific Gravity, UA: 1.02 (ref 1.005–1.030)
Urobilinogen, Ur: 0.2 mg/dL (ref 0.2–1.0)
pH, UA: 5.5 (ref 5.0–7.5)

## 2021-04-10 NOTE — Progress Notes (Signed)
Subjective: 1. Acute prostatitis   2. Elevated PSA   3. BPH with urinary obstruction   4. Urgency of urination      04/10/21: Marc Jimenez returns today in f/u for his history of an elevated PSA.  The PSA is down to 6.0 with an 8.2% f/t ratio on 04/03/21.   He is on tamsulosin for BPH with BOO.  He had e. Coli on his culture on 01/09/21 and was treated with bactrim and his urine is clear today.   Gu hx: Marc Jimenez is a 70 yo who is sent for a PSA of 7.1 on labs from 10/11/20.  He was seen in [place] in 2019 for BPH with BOO and was given tamsulosin but failed to f/u.   He remains on tamsulosin.   He has a long history of alcoholism and was not felt to be a good candidate for PSA screening at that time.   He is currently in assisted living facility.  He had the PSA checked because of LUTS.  He has some frequency and urgency and UUI.  He has some hesitancy with an intermittent stream.   He has an adequate stream.  He has had no hematuria or dysuria.  He has had dark urine with some odor.  His UA today has Pyuria and bacteriuria.   He had a clear urine on 11/06/20 at [facility].  His Cr was 1.07.  He was seen for a sigmoid volvulus at that time.  On the CT his prostate was large but he wasn't in retention.  He didn't have a foley.   PVR today is 38m. ROS:  ROS  No Known Allergies  Past Medical History:  Diagnosis Date   Alcohol abuse    Arrhythmia    Cataplexy    Glaucoma    Heart disease    Hypertension    Narcolepsy    Narcolepsy     Past Surgical History:  Procedure Laterality Date   FLEXIBLE SIGMOIDOSCOPY N/A 02/12/2021   Procedure: FLEXIBLE SIGMOIDOSCOPY;  Surgeon: CHarvel Quale MD;  Location: [facility];  Service: Gastroenterology;  Laterality: N/A;   HERNIA REPAIR     PARTIAL COLECTOMY N/A 02/14/2021   Procedure: PARTIAL COLECTOMY;  Surgeon: JAviva Signs MD;  Location: [facility] ORS;  Service: General;  Laterality: N/A;    Social History   Socioeconomic History   Marital  status: Legally Separated    Spouse name: Not on file   Number of children: Not on file   Years of education: Not on file   Highest education level: Not on file  Occupational History   Not on file  Tobacco Use   Smoking status: Every Day    Packs/day: 1.00    Years: 30.00    Pack years: 30.00    Types: Cigarettes   Smokeless tobacco: Never  Vaping Use   Vaping Use: Never used  Substance and Sexual Activity   Alcohol use: Yes    Alcohol/week: 21.0 standard drinks    Types: 21 Cans of beer per week    Comment: 12 pack of beer daily.    Drug use: Yes    Types: Marijuana   Sexual activity: Not on file  Other Topics Concern   Not on file  Social History Narrative   Not on file   Social Determinants of Health   Financial Resource Strain: Not on file  Food Insecurity: Not on file  Transportation Needs: Not on file  Physical Activity: Not on file  Stress: Not on file  Social Connections: Not on file  Intimate Partner Violence: Not on file    Family History  Family history unknown: Yes    Anti-infectives: Anti-infectives (From admission, onward)    None       Current Outpatient Medications  Medication Sig Dispense Refill   amLODipine (NORVASC) 5 MG tablet Take 1 tablet by mouth daily.     folic acid (FOLVITE) 1 MG tablet Take 1 tablet (1 mg total) by mouth daily. 30 tablet 1   furosemide (LASIX) 20 MG tablet Take 10 mg by mouth daily.     oxyCODONE (OXY IR/ROXICODONE) 5 MG immediate release tablet Take 1-2 tablets (5-10 mg total) by mouth every 8 (eight) hours as needed for severe pain. 20 tablet 0   QUEtiapine (SEROQUEL) 25 MG tablet Take 0.5 tablets (12.5 mg total) by mouth at bedtime. 30 tablet 1   tamsulosin (FLOMAX) 0.4 MG CAPS capsule Take 0.4 mg by mouth daily.     thiamine 100 MG tablet Take 1 tablet (100 mg total) by mouth daily. 30 tablet 1   No current facility-administered medications for this visit.     Objective: Vital signs in last 24  hours: BP (!) 145/88   Pulse 82   Intake/Output from previous day: No intake/output data recorded. Intake/Output this shift: '@IOTHISSHIFT'$ @   Physical Exam  Lab Results:  Recent Results (from the past 2160 hour(s))  Comprehensive metabolic panel     Status: Abnormal   Collection Time: 02/10/21  5:23 PM  Result Value Ref Range   Sodium 134 (L) 135 - 145 mmol/L   Potassium 4.3 3.5 - 5.1 mmol/L   Chloride 101 98 - 111 mmol/L   CO2 25 22 - 32 mmol/L   Glucose, Bld 104 (H) 70 - 99 mg/dL    Comment: Glucose reference range applies only to samples taken after fasting for at least 8 hours.   BUN 13 8 - 23 mg/dL   Creatinine, Ser 1.38 (H) 0.61 - 1.24 mg/dL   Calcium 9.4 8.9 - 10.3 mg/dL   Total Protein 8.2 (H) 6.5 - 8.1 g/dL   Albumin 4.8 3.5 - 5.0 g/dL   AST 22 15 - 41 U/L   ALT 32 0 - 44 U/L   Alkaline Phosphatase 89 38 - 126 U/L   Total Bilirubin 0.5 0.3 - 1.2 mg/dL   GFR, Estimated 55 (L) >60 mL/min    Comment: (NOTE) Calculated using the CKD-EPI Creatinine Equation (2021)    Anion gap 8 5 - 15    Comment: Performed at [facility], [address], [place] , [place] [address]  Lipase, blood     Status: None   Collection Time: 02/10/21  5:23 PM  Result Value Ref Range   Lipase 38 11 - 51 U/L    Comment: Performed at [facility], [address], [place], [place] [address]  CBC WITH DIFFERENTIAL     Status: None   Collection Time: 02/10/21  5:23 PM  Result Value Ref Range   WBC 7.1 4.0 - 10.5 K/uL   RBC 5.13 4.22 - 5.81 MIL/uL   Hemoglobin 16.2 13.0 - 17.0 g/dL   HCT 49.2 39.0 - 52.0 %   MCV 95.9 80.0 - 100.0 fL   MCH 31.6 26.0 - 34.0 pg   MCHC 32.9 30.0 - 36.0 g/dL   RDW 13.6 11.5 - 15.5 %   Platelets 192 150 - 400 K/uL   nRBC 0.0 0.0 - 0.2 %  Neutrophils Relative % 55 %   Neutro Abs 3.9 1.7 - 7.7 K/uL   Lymphocytes Relative 35 %   Lymphs Abs 2.5 0.7 - 4.0 K/uL   Monocytes Relative 8 %   Monocytes Absolute 0.5 0.1 - 1.0 K/uL   Eosinophils Relative 2 %    Eosinophils Absolute 0.1 0.0 - 0.5 K/uL   Basophils Relative 0 %   Basophils Absolute 0.0 0.0 - 0.1 K/uL   Immature Granulocytes 0 %   Abs Immature Granulocytes 0.02 0.00 - 0.07 K/uL    Comment: Performed at [facility], [address] [place], [place] [address]  Urinalysis, Routine w reflex microscopic Urine, Clean Catch     Status: Abnormal   Collection Time: 02/10/21  9:24 PM  Result Value Ref Range   Color, Urine STRAW (A) YELLOW   APPearance CLEAR CLEAR   Specific Gravity, Urine 1.005 1.005 - 1.030   pH 5.0 5.0 - 8.0   Glucose, UA NEGATIVE NEGATIVE mg/dL   Hgb urine dipstick NEGATIVE NEGATIVE   Bilirubin Urine NEGATIVE NEGATIVE   Ketones, ur NEGATIVE NEGATIVE mg/dL   Protein, ur NEGATIVE NEGATIVE mg/dL   Nitrite NEGATIVE NEGATIVE   Leukocytes,Ua NEGATIVE NEGATIVE    Comment: Performed at [facility], [address], [place], [place] [address]  SARS CORONAVIRUS 2 (TAT 6-24 HRS) Nasopharyngeal Nasopharyngeal Swab     Status: None   Collection Time: 02/10/21  9:24 PM   Specimen: Nasopharyngeal Swab  Result Value Ref Range   SARS Coronavirus 2 NEGATIVE NEGATIVE    Comment: (NOTE) SARS-CoV-2 target nucleic acids are NOT DETECTED.  The SARS-CoV-2 RNA is generally detectable in upper and lower respiratory specimens during the acute phase of infection. Negative results do not preclude SARS-CoV-2 infection, do not rule out co-infections with other pathogens, and should not be used as the sole basis for treatment or other patient management decisions. Negative results must be combined with clinical observations, patient history, and epidemiological information. The expected result is Negative.  Fact Sheet for Patients: SugarRoll.be  Fact Sheet for Healthcare Providers: https://www.woods-mathews.com/  This test is not yet approved or cleared by the [place] FDA and  has been authorized for detection and/or diagnosis of SARS-CoV-2  by FDA under an Emergency Use Authorization (EUA). This EUA will remain  in effect (meaning this test can be used) for the duration of the COVID-19 declaration under Se ction 564(b)(1) of the Act, 21 U.S.C. section 360bbb-3(b)(1), unless the authorization is terminated or revoked sooner.  Performed at [facility], [place] [address] [place], [place] [address]   Ethanol     Status: None   Collection Time: 02/10/21 10:12 PM  Result Value Ref Range   Alcohol, Ethyl (B) <10 <10 mg/dL    Comment: (NOTE) Lowest detectable limit for serum alcohol is 10 mg/dL.  For medical purposes only. Performed at [facility], [address], [place], [place] [address]   Magnesium     Status: None   Collection Time: 02/10/21 10:12 PM  Result Value Ref Range   Magnesium 2.2 1.7 - 2.4 mg/dL    Comment: Performed at [facility], [address], [place], [place] [address]  Phosphorus     Status: None   Collection Time: 02/10/21 10:12 PM  Result Value Ref Range   Phosphorus 3.8 2.5 - 4.6 mg/dL    Comment: Performed at [facility], [address] [place],  [address]  HIV Antibody (routine testing w rflx)     Status:  None   Collection Time: 02/11/21  8:12 AM  Result Value Ref Range   HIV Screen 4th Generation wRfx Non Reactive Non Reactive    Comment: Performed at [facility], [place] [address], [place], [place] [id number]  Basic metabolic panel     Status: Abnormal   Collection Time: 02/11/21  8:12 AM  Result Value Ref Range   Sodium 137 135 - 145 mmol/L   Potassium 3.9 3.5 - 5.1 mmol/L   Chloride 109 98 - 111 mmol/L   CO2 22 22 - 32 mmol/L   Glucose, Bld 105 (H) 70 - 99 mg/dL    Comment: Glucose reference range applies only to samples taken after fasting for at least 8 hours.   BUN 10 8 - 23 mg/dL   Creatinine, Ser 1.00 0.61 - 1.24 mg/dL   Calcium 8.7 (L) 8.9 - 10.3 mg/dL   GFR, Estimated >60 >60 mL/min    Comment: (NOTE) Calculated using the CKD-EPI Creatinine Equation (2021)     Anion gap 6 5 - 15    Comment: Performed at [facility], [address], [place], [place] [address]  CBC     Status: None   Collection Time: 02/11/21  8:12 AM  Result Value Ref Range   WBC 5.6 4.0 - 10.5 K/uL   RBC 4.50 4.22 - 5.81 MIL/uL   Hemoglobin 14.3 13.0 - 17.0 g/dL   HCT 42.3 39.0 - 52.0 %   MCV 94.0 80.0 - 100.0 fL   MCH 31.8 26.0 - 34.0 pg   MCHC 33.8 30.0 - 36.0 g/dL   RDW 13.3 11.5 - 15.5 %   Platelets 185 150 - 400 K/uL   nRBC 0.0 0.0 - 0.2 %    Comment: Performed at [facility], [address] [place], [place] [address]  Comprehensive metabolic panel     Status: Abnormal   Collection Time: 02/13/21  6:06 AM  Result Value Ref Range   Sodium 137 135 - 145 mmol/L   Potassium 4.0 3.5 - 5.1 mmol/L   Chloride 109 98 - 111 mmol/L   CO2 24 22 - 32 mmol/L   Glucose, Bld 116 (H) 70 - 99 mg/dL    Comment: Glucose reference range applies only to samples taken after fasting for at least 8 hours.   BUN 6 (L) 8 - 23 mg/dL   Creatinine, Ser 0.90 0.61 - 1.24 mg/dL   Calcium 8.6 (L) 8.9 - 10.3 mg/dL   Total Protein 5.8 (L) 6.5 - 8.1 g/dL   Albumin 3.3 (L) 3.5 - 5.0 g/dL   AST 15 15 - 41 U/L   ALT 17 0 - 44 U/L   Alkaline Phosphatase 62 38 - 126 U/L   Total Bilirubin 0.6 0.3 - 1.2 mg/dL   GFR, Estimated >60 >60 mL/min    Comment: (NOTE) Calculated using the CKD-EPI Creatinine Equation (2021)    Anion gap 4 (L) 5 - 15    Comment: Performed at [facility], [address], [place], [place] [address]  CBC     Status: Abnormal   Collection Time: 02/13/21  6:06 AM  Result Value Ref Range   WBC 4.7 4.0 - 10.5 K/uL   RBC 4.13 (L) 4.22 - 5.81 MIL/uL   Hemoglobin 13.1 13.0 - 17.0 g/dL   HCT 38.9 (L) 39.0 - 52.0 %   MCV 94.2 80.0 - 100.0 fL   MCH 31.7 26.0 - 34.0 pg   MCHC 33.7 30.0 - 36.0 g/dL  RDW 13.2 11.5 - 15.5 %   Platelets 174 150 - 400 K/uL   nRBC 0.0 0.0 - 0.2 %    Comment: Performed at [facility], [address] [place], [place] [address]  Magnesium     Status:  None   Collection Time: 02/13/21  6:06 AM  Result Value Ref Range   Magnesium 1.9 1.7 - 2.4 mg/dL    Comment: Performed at [facility], [address] [place], [place] [address]  Phosphorus     Status: None   Collection Time: 02/13/21  6:06 AM  Result Value Ref Range   Phosphorus 2.9 2.5 - 4.6 mg/dL    Comment: Performed at [facility], [address] [place], [place] [address]  ECHOCARDIOGRAM COMPLETE     Status: None   Collection Time: 02/13/21 12:23 PM  Result Value Ref Range   Weight 2,211.65 oz   Height 67 in   BP 148/80 mmHg   Area-P 1/2 4.80 cm2   S' Lateral 2.70 cm   P 1/2 time 719 msec  Surgical pcr screen     Status: None   Collection Time: 02/14/21  1:57 AM   Specimen: Nasal Mucosa; Nasal Swab  Result Value Ref Range   MRSA, PCR NEGATIVE NEGATIVE   Staphylococcus aureus NEGATIVE NEGATIVE    Comment: (NOTE) The Xpert SA Assay (FDA approved for NASAL specimens in patients 19 years of age and older), is one component of a comprehensive surveillance program. It is not intended to diagnose infection nor to guide or monitor treatment. Performed at [facility], [address], [place], [place] [address]   CBC     Status: None   Collection Time: 02/14/21  6:17 AM  Result Value Ref Range   WBC 5.1 4.0 - 10.5 K/uL   RBC 4.26 4.22 - 5.81 MIL/uL   Hemoglobin 13.4 13.0 - 17.0 g/dL   HCT 40.1 39.0 - 52.0 %   MCV 94.1 80.0 - 100.0 fL   MCH 31.5 26.0 - 34.0 pg   MCHC 33.4 30.0 - 36.0 g/dL   RDW 13.2 11.5 - 15.5 %   Platelets 187 150 - 400 K/uL   nRBC 0.0 0.0 - 0.2 %    Comment: Performed at [facility], [address], [place], [place] [id number]  Basic metabolic panel     Status: Abnormal   Collection Time: 02/14/21  6:17 AM  Result Value Ref Range   Sodium 137 135 - 145 mmol/L   Potassium 3.8 3.5 - 5.1 mmol/L   Chloride 108 98 - 111 mmol/L   CO2 24 22 - 32 mmol/L   Glucose, Bld 90 70 - 99 mg/dL    Comment: Glucose reference range applies only to samples taken after  fasting for at least 8 hours.   BUN 7 (L) 8 - 23 mg/dL   Creatinine, Ser 0.92 0.61 - 1.24 mg/dL   Calcium 8.6 (L) 8.9 - 10.3 mg/dL   GFR, Estimated >60 >60 mL/min    Comment: (NOTE) Calculated using the CKD-EPI Creatinine Equation (2021)    Anion gap 5 5 - 15    Comment: Performed at [facility], [address] [place], [place] [address]  Surgical pathology     Status: None   Collection Time: 02/14/21  1:25 PM  Result Value Ref Range   SURGICAL PATHOLOGY      SURGICAL PATHOLOGY CASE: [id number] PATIENT: Effie Shy Surgical Pathology Report     Clinical History: sigmoid volvulus   FINAL  MICROSCOPIC DIAGNOSIS:  A. COLON, SIGMOID, RESECTION: -  Portion of benign colon without significant histologic abnormality -  Viable margins   GROSS DESCRIPTION:  Received in formalin is a 37 cm segment of colon, clinically sigmoid colon.  The segment is dilated measuring up to 9 cm in diameter.  The serosa is smooth and tan.  The mucosa is glistening, tan with flattening of the intestinal folds.  An extrinsic or intrinsic obstruction is not grossly identified.  There is a separate 4 x 3 x 1.5 cm portion of intestine with multiple staple lines present.  Sections are submitted in 5 cassettes. 1, 2 = margins of resection 3, 4 = representative sections 5 = section from separate portion of intestine [facility] 02/18/2021)   Final Diagnosis performed by Thressa Sheller, MD.   Electronically signed 02/19/2021 Technical component perfor med at [facility], [place] [address], [place], [place] [address].  Professional component performed at [employer], [place] [address] [place], [place] [address].  Immunohistochemistry Technical component (if applicable) was performed at [facility]. [address], [place], [place], [place] [address].   IMMUNOHISTOCHEMISTRY DISCLAIMER (if applicable): Some of these immunohistochemical stains may have been  developed and the performance characteristics determine by [facility]. Some may not have been cleared or approved by the U.S. Food and Drug Administration. The FDA has determined that such clearance or approval is not necessary. This test is used for clinical purposes. It should not be regarded as investigational or for research. This laboratory is certified under the [employer] (CLIA-88) as qualified to perform high complexity clinical laboratory testing.  The con trols stained appropriately.   Basic metabolic panel     Status: Abnormal   Collection Time: 02/15/21  4:24 AM  Result Value Ref Range   Sodium 134 (L) 135 - 145 mmol/L   Potassium 4.1 3.5 - 5.1 mmol/L   Chloride 101 98 - 111 mmol/L   CO2 26 22 - 32 mmol/L   Glucose, Bld 128 (H) 70 - 99 mg/dL    Comment: Glucose reference range applies only to samples taken after fasting for at least 8 hours.   BUN 9 8 - 23 mg/dL   Creatinine, Ser 0.97 0.61 - 1.24 mg/dL   Calcium 8.8 (L) 8.9 - 10.3 mg/dL   GFR, Estimated >60 >60 mL/min    Comment: (NOTE) Calculated using the CKD-EPI Creatinine Equation (2021)    Anion gap 7 5 - 15    Comment: Performed at [facility], [address], [place], [place] [address]  Magnesium     Status: None   Collection Time: 02/15/21  4:24 AM  Result Value Ref Range   Magnesium 1.7 1.7 - 2.4 mg/dL    Comment: Performed at [facility], [address] [place], [place] [address]  Phosphorus     Status: None   Collection Time: 02/15/21  4:24 AM  Result Value Ref Range   Phosphorus 3.2 2.5 - 4.6 mg/dL    Comment: Performed at [facility], [address] [place], [place] [address]  CBC     Status: Abnormal   Collection Time: 02/15/21  4:24 AM  Result Value Ref Range   WBC 11.0 (H) 4.0 - 10.5 K/uL   RBC 4.27 4.22 - 5.81 MIL/uL   Hemoglobin 13.5 13.0 - 17.0 g/dL   HCT 39.5 39.0 - 52.0 %   MCV 92.5 80.0 - 100.0 fL   MCH  31.6 26.0 - 34.0 pg   MCHC  34.2 30.0 - 36.0 g/dL   RDW 13.0 11.5 - 15.5 %   Platelets 205 150 - 400 K/uL   nRBC 0.0 0.0 - 0.2 %    Comment: Performed at [facility], [address], [place], [place] [address]  CBC     Status: Abnormal   Collection Time: 02/17/21  4:32 AM  Result Value Ref Range   WBC 6.3 4.0 - 10.5 K/uL   RBC 4.12 (L) 4.22 - 5.81 MIL/uL   Hemoglobin 13.0 13.0 - 17.0 g/dL   HCT 38.5 (L) 39.0 - 52.0 %   MCV 93.4 80.0 - 100.0 fL   MCH 31.6 26.0 - 34.0 pg   MCHC 33.8 30.0 - 36.0 g/dL   RDW 13.3 11.5 - 15.5 %   Platelets 208 150 - 400 K/uL   nRBC 0.0 0.0 - 0.2 %    Comment: Performed at [facility], [address] [place], [place] [id number]  Basic metabolic panel     Status: Abnormal   Collection Time: 02/17/21  4:32 AM  Result Value Ref Range   Sodium 137 135 - 145 mmol/L   Potassium 3.7 3.5 - 5.1 mmol/L   Chloride 107 98 - 111 mmol/L   CO2 26 22 - 32 mmol/L   Glucose, Bld 87 70 - 99 mg/dL    Comment: Glucose reference range applies only to samples taken after fasting for at least 8 hours.   BUN 13 8 - 23 mg/dL   Creatinine, Ser 0.92 0.61 - 1.24 mg/dL   Calcium 8.7 (L) 8.9 - 10.3 mg/dL   GFR, Estimated >60 >60 mL/min    Comment: (NOTE) Calculated using the CKD-EPI Creatinine Equation (2021)    Anion gap 4 (L) 5 - 15    Comment: Performed at [facility], [address] [place], [place] [address]  PSA, total and free     Status: Abnormal   Collection Time: 04/03/21  2:33 PM  Result Value Ref Range   Prostate Specific Ag, Serum 6.0 (H) 0.0 - 4.0 ng/mL    Comment: Roche ECLIA methodology. According to the American Urological Association, Serum PSA should decrease and remain at undetectable levels after radical prostatectomy. The AUA defines biochemical recurrence as an initial PSA value 0.2 ng/mL or greater followed by a subsequent confirmatory PSA value 0.2 ng/mL or greater. Values obtained with different assay methods or kits cannot be used interchangeably. Results cannot be  interpreted as absolute evidence of the presence or absence of malignant disease.    PSA, Free 0.49 N/A ng/mL    Comment: Roche ECLIA methodology.   PSA, Free Pct 8.2 %    Comment: The table below lists the probability of prostate cancer for men with non-suspicious DRE results and total PSA between 4 and 10 ng/mL, by patient age Ricci Barker, [place], [id number]).                   % Free PSA       50-64 yr        65-75 yr                   0.00-10.00%        56%             55%                  10.01-15.00%        24%  35%                  15.01-20.00%        17%             23%                  20.01-25.00%        10%             20%                       >25.00%         5%              9% Please note:  Catalona et al did not make specific               recommendations regarding the use of               percent free PSA for any other population               of men.   Urinalysis, Routine w reflex microscopic     Status: None   Collection Time: 04/10/21 11:36 AM  Result Value Ref Range   Specific Gravity, UA 1.020 1.005 - 1.030   pH, UA 5.5 5.0 - 7.5   Color, UA Yellow Yellow   Appearance Ur Clear Clear   Leukocytes,UA Negative Negative   Protein,UA Negative Negative/Trace   Glucose, UA Negative Negative   Ketones, UA Negative Negative   RBC, UA Negative Negative   Bilirubin, UA Negative Negative   Urobilinogen, Ur 0.2 0.2 - 1.0 mg/dL   Nitrite, UA Negative Negative   Microscopic Examination Comment     Comment: Microscopic follows if indicated.      BMET No results for input(s): NA, K, CL, CO2, GLUCOSE, BUN, CREATININE, CALCIUM in the last 72 hours. PT/INR No results for input(s): LABPROT, INR in the last 72 hours. ABG No results for input(s): PHART, HCO3 in the last 72 hours.  Invalid input(s): PCO2, PO2 UA has pyuria and many bacteria but is nit-.  Studies/Results:    Assessment/Plan: Elevated PSA.   His PSA is down some but still elevated with  a f/t ratio of 8% but he is a very poor candidate for a biopsy so with the decline with treatment of infection and the benign exam I will just repeat a PSA in 6 months.    BPH with BOO and OAB.  He will continue the tamsulosin for now.    UTI.  He had e. Coli and was treated with bactrim.   UA is clear.   No orders of the defined types were placed in this encounter.    Orders Placed This Encounter  Procedures   Urinalysis, Routine w reflex microscopic   PSA, total and free    Standing Status:   Future    Standing Expiration Date:   04/10/2022     Return in about 6 months (around 10/11/2021) for with PSA.    CC: Dr. Stoney Bang.      Irine Seal 04/10/2021 [phone]

## 2021-04-10 NOTE — Progress Notes (Signed)

## 2021-04-15 DIAGNOSIS — Z20828 Contact with and (suspected) exposure to other viral communicable diseases: Secondary | ICD-10-CM | POA: Diagnosis not present

## 2021-04-15 DIAGNOSIS — U071 COVID-19: Secondary | ICD-10-CM | POA: Diagnosis not present

## 2021-04-22 DIAGNOSIS — U071 COVID-19: Secondary | ICD-10-CM | POA: Diagnosis not present

## 2021-04-22 DIAGNOSIS — Z20828 Contact with and (suspected) exposure to other viral communicable diseases: Secondary | ICD-10-CM | POA: Diagnosis not present

## 2021-04-30 DIAGNOSIS — H25813 Combined forms of age-related cataract, bilateral: Secondary | ICD-10-CM | POA: Diagnosis not present

## 2021-04-30 DIAGNOSIS — Z20828 Contact with and (suspected) exposure to other viral communicable diseases: Secondary | ICD-10-CM | POA: Diagnosis not present

## 2021-04-30 DIAGNOSIS — U071 COVID-19: Secondary | ICD-10-CM | POA: Diagnosis not present

## 2021-04-30 DIAGNOSIS — H401133 Primary open-angle glaucoma, bilateral, severe stage: Secondary | ICD-10-CM | POA: Diagnosis not present

## 2021-05-05 DIAGNOSIS — U071 COVID-19: Secondary | ICD-10-CM | POA: Diagnosis not present

## 2021-05-05 DIAGNOSIS — Z20828 Contact with and (suspected) exposure to other viral communicable diseases: Secondary | ICD-10-CM | POA: Diagnosis not present

## 2021-05-13 DIAGNOSIS — Z20828 Contact with and (suspected) exposure to other viral communicable diseases: Secondary | ICD-10-CM | POA: Diagnosis not present

## 2021-05-13 DIAGNOSIS — U071 COVID-19: Secondary | ICD-10-CM | POA: Diagnosis not present

## 2021-05-19 DIAGNOSIS — U071 COVID-19: Secondary | ICD-10-CM | POA: Diagnosis not present

## 2021-05-19 DIAGNOSIS — Z20828 Contact with and (suspected) exposure to other viral communicable diseases: Secondary | ICD-10-CM | POA: Diagnosis not present

## 2021-05-26 DIAGNOSIS — Z20828 Contact with and (suspected) exposure to other viral communicable diseases: Secondary | ICD-10-CM | POA: Diagnosis not present

## 2021-05-26 DIAGNOSIS — U071 COVID-19: Secondary | ICD-10-CM | POA: Diagnosis not present

## 2021-05-30 ENCOUNTER — Other Ambulatory Visit: Payer: Self-pay

## 2021-05-30 ENCOUNTER — Emergency Department (HOSPITAL_COMMUNITY): Payer: Medicare Other

## 2021-05-30 ENCOUNTER — Encounter (HOSPITAL_COMMUNITY): Payer: Self-pay

## 2021-05-30 ENCOUNTER — Emergency Department (HOSPITAL_COMMUNITY)
Admission: EM | Admit: 2021-05-30 | Discharge: 2021-05-30 | Disposition: A | Discharge status: Home / Self Care | Payer: Medicare Other | Attending: Emergency Medicine | Admitting: Emergency Medicine

## 2021-05-30 DIAGNOSIS — Z79899 Other long term (current) drug therapy: Secondary | ICD-10-CM | POA: Diagnosis not present

## 2021-05-30 DIAGNOSIS — I10 Essential (primary) hypertension: Secondary | ICD-10-CM | POA: Insufficient documentation

## 2021-05-30 DIAGNOSIS — R109 Unspecified abdominal pain: Secondary | ICD-10-CM | POA: Insufficient documentation

## 2021-05-30 DIAGNOSIS — R197 Diarrhea, unspecified: Secondary | ICD-10-CM | POA: Diagnosis not present

## 2021-05-30 DIAGNOSIS — K760 Fatty (change of) liver, not elsewhere classified: Secondary | ICD-10-CM | POA: Diagnosis not present

## 2021-05-30 DIAGNOSIS — F17210 Nicotine dependence, cigarettes, uncomplicated: Secondary | ICD-10-CM | POA: Insufficient documentation

## 2021-05-30 LAB — CBC
HCT: 43.2 % (ref 39.0–52.0)
Hemoglobin: 14.6 g/dL (ref 13.0–17.0)
MCH: 31.9 pg (ref 26.0–34.0)
MCHC: 33.8 g/dL (ref 30.0–36.0)
MCV: 94.5 fL (ref 80.0–100.0)
Platelets: 214 10*3/uL (ref 150–400)
RBC: 4.57 MIL/uL (ref 4.22–5.81)
RDW: 13.8 % (ref 11.5–15.5)
WBC: 5.6 10*3/uL (ref 4.0–10.5)
nRBC: 0 % (ref 0.0–0.2)

## 2021-05-30 LAB — COMPREHENSIVE METABOLIC PANEL
ALT: 23 U/L (ref 0–44)
AST: 20 U/L (ref 15–41)
Albumin: 4.1 g/dL (ref 3.5–5.0)
Alkaline Phosphatase: 66 U/L (ref 38–126)
Anion gap: 4 — ABNORMAL LOW (ref 5–15)
BUN: 18 mg/dL (ref 8–23)
CO2: 27 mmol/L (ref 22–32)
Calcium: 9 mg/dL (ref 8.9–10.3)
Chloride: 105 mmol/L (ref 98–111)
Creatinine, Ser: 1.01 mg/dL (ref 0.61–1.24)
GFR, Estimated: >60 mL/min (ref >60)
Glucose, Bld: 110 mg/dL — ABNORMAL HIGH (ref 70–99)
Potassium: 4 mmol/L (ref 3.5–5.1)
Sodium: 136 mmol/L (ref 135–145)
Total Bilirubin: 0.7 mg/dL (ref 0.3–1.2)
Total Protein: 7 g/dL (ref 6.5–8.1)

## 2021-05-30 LAB — LIPASE, BLOOD: Lipase: 38 U/L (ref 11–51)

## 2021-05-30 MED ORDER — IOHEXOL 300 MG/ML  SOLN
100.0000 mL | Freq: Once | INTRAMUSCULAR | Status: AC | PRN
  Administered 2021-05-30: 100 mL via INTRAVENOUS

## 2021-05-30 NOTE — Discharge Instructions (Addendum)
Drink plenty of fluid.  Return if any problems or sign of infection

## 2021-05-30 NOTE — ED Triage Notes (Signed)
Pt from [facility], presents to ED with complaints of diarrhea started this abdomen.

## 2021-05-31 NOTE — ED Provider Notes (Signed)
[facility] EMERGENCY DEPARTMENT Provider Note   CSN: [id number] Arrival date & time: 05/30/21  1413     History Chief Complaint  Patient presents with   Diarrhea    Marc Jimenez is a 70 y.o. male.  The history is provided by the patient and a relative. No language interpreter was used.  Diarrhea Quality:  Explosive Severity:  Moderate Onset quality:  Gradual Number of episodes:  1 Duration:  6 hours Timing:  Intermittent Progression:  Improving Relieved by:  Nothing Worsened by:  Nothing Associated symptoms: abdominal pain   Risk factors: no sick contacts   Pt had a partial colectomy in June of this year.  Pt had severe abdominal pain earlier today.  Pt was at his sisters funeral.  Pt reports pain has improved since having diarrhea.      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00    Pack years: 30.00    Types: Cigarettes   Smokeless tobacco: Never  Vaping Use   Vaping Use: Never used  Substance Use Topics   Alcohol use: Yes    Alcohol/week: 21.0 standard  drinks    Types: 21 Cans of beer per week    Comment: 12 pack of beer daily.    Drug use: Yes    Types: Marijuana    Home Medications Prior to Admission medications   Medication Sig Start Date End Date Taking? Authorizing Provider  amLODipine (NORVASC) 5 MG tablet Take 1 tablet by mouth daily. 01/07/21   [provider]  folic acid (FOLVITE) 1 MG tablet Take 1 tablet (1 mg total) by mouth daily. 02/18/21   Barton Dubois, MD  furosemide (LASIX) 20 MG tablet Take 10 mg by mouth daily. 01/07/21   [provider]  oxyCODONE (OXY IR/ROXICODONE) 5 MG immediate release tablet Take 1-2 tablets (5-10 mg total) by mouth every 8 (eight) hours as needed for severe pain. 02/18/21   Barton Dubois, MD  QUEtiapine (SEROQUEL) 25 MG tablet Take 0.5 tablets (12.5 mg total) by mouth at bedtime. 02/18/21   Barton Dubois, MD  tamsulosin (FLOMAX) 0.4 MG CAPS capsule Take 0.4 mg by mouth daily. 01/07/21   [provider]  thiamine 100 MG tablet Take 1 tablet (100 mg total) by mouth daily. 02/18/21   Barton Dubois, MD    Allergies    Patient has no known allergies.  Review of Systems   Review of Systems  Gastrointestinal:  Positive for abdominal pain and diarrhea.  All other systems reviewed and are negative.  Physical Exam Updated Vital Signs BP 128/81 (BP Location: Right Arm)   Pulse 76   Temp 98 F (36.7 C) (Oral)   Resp 17   SpO2 99%   Physical Exam Vitals and nursing note reviewed.  Constitutional:      Appearance: He is well-developed.  HENT:     Head: Normocephalic and atraumatic.  Eyes:     Conjunctiva/sclera: Conjunctivae normal.  Cardiovascular:     Rate and Rhythm: Normal rate and regular rhythm.     Heart sounds: No murmur heard. Pulmonary:     Effort: Pulmonary effort is normal. No respiratory distress.     Breath sounds: Normal breath sounds.  Abdominal:     Palpations: Abdomen is soft.     Tenderness: There is no abdominal tenderness.  Musculoskeletal:      Cervical back: Neck supple.  Skin:    General: Skin is warm and dry.  Neurological:     General: No focal deficit present.     Mental Status: He is alert.    ED Results / Procedures / Treatments   Labs (all labs ordered are listed, but only abnormal results are displayed) Labs Reviewed  COMPREHENSIVE METABOLIC PANEL - Abnormal; Notable for the following components:      Result Value   Glucose, Bld 110 (*)    Anion gap 4 (*)    All other components within normal limits  LIPASE, BLOOD  CBC  URINALYSIS, ROUTINE W REFLEX MICROSCOPIC    EKG None  Radiology CT ABDOMEN PELVIS W CONTRAST  Result Date: 05/30/2021 CLINICAL DATA:  Diarrhea. EXAM: CT ABDOMEN AND PELVIS WITH CONTRAST TECHNIQUE: Multidetector CT imaging of the abdomen and pelvis was performed using the standard protocol following bolus administration of intravenous contrast. CONTRAST:  120mL OMNIPAQUE IOHEXOL 300 MG/ML  SOLN COMPARISON:  February 10, 2021 FINDINGS: Lower chest: No acute abnormality. Hepatobiliary: There is diffuse fatty infiltration of the liver parenchyma. No focal liver abnormality is seen. No gallstones, gallbladder wall thickening, or biliary dilatation. Pancreas: Unremarkable. No pancreatic ductal dilatation or surrounding inflammatory changes. Spleen: Normal in size without focal abnormality. Adrenals/Urinary Tract: The right adrenal gland is normal in size and appearance. A 2.0 cm x 1.5 cm isodense (approximately 113.0 Hounsfield units) left adrenal mass is noted. Kidneys are normal in size, without renal calculi or hydronephrosis. Multiple stable bilateral simple renal cysts are seen. Bladder is unremarkable. Stomach/Bowel: Diffuse gastric wall thickening is seen with a mild amount of fluid noted along the gastric wall. Appendix appears normal. Surgically anastomosed bowel is seen along the proximal to mid sigmoid colon. No evidence of bowel dilatation. Very mild thickening of the distal sigmoid colon is seen.  This segment of sigmoid colon is also poorly distended Vascular/Lymphatic: Aortic atherosclerosis. No enlarged abdominal or pelvic lymph nodes. Reproductive: There is moderate to marked severity prostate gland enlargement. Other: A small, stable umbilical hernia is seen. No abdominopelvic ascites. Musculoskeletal: Degenerative changes seen throughout the lumbar spine, most notably at the level of L5-S1. IMPRESSION: 1. Findings suggestive of moderate severity gastritis. 2. Hepatic steatosis. 3. Stable bilateral simple renal cysts. 4. Stable left adrenal mass which may represent an adrenal adenoma. 5. Small, stable umbilical hernia. 6. Moderate to marked severity prostate gland enlargement. 7. Aortic atherosclerosis. Aortic Atherosclerosis (ICD10-I70.0). Electronically Signed   By: Virgina Norfolk M.D.   On: 05/30/2021 19:55    Procedures Procedures   Medications Ordered  in ED Medications  iohexol (OMNIPAQUE) 300 MG/ML solution 100 mL (100 mLs Intravenous Contrast Given 05/30/21 1910)    ED Course  I have reviewed the triage vital signs and the nursing notes.  Pertinent labs & imaging results that were available during my care of the patient were reviewed by me and considered in my medical decision making (see chart for details).    MDM Rules/Calculators/A&P                           MDM:  Pt's sister is here. [facility] RN)  Ct scan obtained and reviewed,  No evidence of obstructive process.  Pt encouraged to drink fluids and tolerated well  Final Clinical Impression(s) / ED Diagnoses Final diagnoses:  Diarrhea, unspecified type    Rx / DC Orders ED Discharge Orders     None     An After Visit Summary was printed and given to the patient.    Sidney Ace 05/31/21 Serita Butcher, MD 06/02/21 [phone]

## 2021-06-02 DIAGNOSIS — Z20828 Contact with and (suspected) exposure to other viral communicable diseases: Secondary | ICD-10-CM | POA: Diagnosis not present

## 2021-06-02 DIAGNOSIS — U071 COVID-19: Secondary | ICD-10-CM | POA: Diagnosis not present

## 2021-06-10 DIAGNOSIS — U071 COVID-19: Secondary | ICD-10-CM | POA: Diagnosis not present

## 2021-06-10 DIAGNOSIS — Z20828 Contact with and (suspected) exposure to other viral communicable diseases: Secondary | ICD-10-CM | POA: Diagnosis not present

## 2021-06-18 DIAGNOSIS — Z23 Encounter for immunization: Secondary | ICD-10-CM | POA: Diagnosis not present

## 2021-06-19 DIAGNOSIS — Z79899 Other long term (current) drug therapy: Secondary | ICD-10-CM | POA: Diagnosis not present

## 2021-06-19 DIAGNOSIS — H401133 Primary open-angle glaucoma, bilateral, severe stage: Secondary | ICD-10-CM | POA: Diagnosis not present

## 2021-06-19 DIAGNOSIS — H548 Legal blindness, as defined in USA: Secondary | ICD-10-CM | POA: Diagnosis not present

## 2021-06-23 DIAGNOSIS — U071 COVID-19: Secondary | ICD-10-CM | POA: Diagnosis not present

## 2021-06-23 DIAGNOSIS — Z20828 Contact with and (suspected) exposure to other viral communicable diseases: Secondary | ICD-10-CM | POA: Diagnosis not present

## 2021-07-01 DIAGNOSIS — M79674 Pain in right toe(s): Secondary | ICD-10-CM | POA: Diagnosis not present

## 2021-07-01 DIAGNOSIS — B351 Tinea unguium: Secondary | ICD-10-CM | POA: Diagnosis not present

## 2021-07-01 DIAGNOSIS — M79675 Pain in left toe(s): Secondary | ICD-10-CM | POA: Diagnosis not present

## 2021-07-02 DIAGNOSIS — U071 COVID-19: Secondary | ICD-10-CM | POA: Diagnosis not present

## 2021-07-02 DIAGNOSIS — Z20828 Contact with and (suspected) exposure to other viral communicable diseases: Secondary | ICD-10-CM | POA: Diagnosis not present

## 2021-07-09 DIAGNOSIS — U071 COVID-19: Secondary | ICD-10-CM | POA: Diagnosis not present

## 2021-07-09 DIAGNOSIS — Z20828 Contact with and (suspected) exposure to other viral communicable diseases: Secondary | ICD-10-CM | POA: Diagnosis not present

## 2021-07-16 DIAGNOSIS — U071 COVID-19: Secondary | ICD-10-CM | POA: Diagnosis not present

## 2021-07-16 DIAGNOSIS — Z20828 Contact with and (suspected) exposure to other viral communicable diseases: Secondary | ICD-10-CM | POA: Diagnosis not present

## 2021-07-22 DIAGNOSIS — U071 COVID-19: Secondary | ICD-10-CM | POA: Diagnosis not present

## 2021-07-22 DIAGNOSIS — Z20828 Contact with and (suspected) exposure to other viral communicable diseases: Secondary | ICD-10-CM | POA: Diagnosis not present

## 2021-07-28 DIAGNOSIS — Z20828 Contact with and (suspected) exposure to other viral communicable diseases: Secondary | ICD-10-CM | POA: Diagnosis not present

## 2021-07-28 DIAGNOSIS — U071 COVID-19: Secondary | ICD-10-CM | POA: Diagnosis not present

## 2021-08-05 DIAGNOSIS — Z20828 Contact with and (suspected) exposure to other viral communicable diseases: Secondary | ICD-10-CM | POA: Diagnosis not present

## 2021-08-05 DIAGNOSIS — U071 COVID-19: Secondary | ICD-10-CM | POA: Diagnosis not present

## 2021-09-02 DIAGNOSIS — M79675 Pain in left toe(s): Secondary | ICD-10-CM | POA: Diagnosis not present

## 2021-09-02 DIAGNOSIS — B351 Tinea unguium: Secondary | ICD-10-CM | POA: Diagnosis not present

## 2021-09-02 DIAGNOSIS — M79674 Pain in right toe(s): Secondary | ICD-10-CM | POA: Diagnosis not present

## 2021-10-02 ENCOUNTER — Other Ambulatory Visit: Payer: Medicare Other

## 2021-10-02 ENCOUNTER — Other Ambulatory Visit: Payer: Self-pay

## 2021-10-02 DIAGNOSIS — R972 Elevated prostate specific antigen [PSA]: Secondary | ICD-10-CM | POA: Diagnosis not present

## 2021-10-03 LAB — PSA, TOTAL AND FREE
PSA, Free Pct: 9.5 %
PSA, Free: 0.59 ng/mL
Prostate Specific Ag, Serum: 6.2 ng/mL — ABNORMAL HIGH (ref 0.0–4.0)

## 2021-10-09 ENCOUNTER — Other Ambulatory Visit: Payer: Self-pay

## 2021-10-09 ENCOUNTER — Encounter: Payer: Self-pay | Admitting: Urology

## 2021-10-09 ENCOUNTER — Ambulatory Visit (INDEPENDENT_AMBULATORY_CARE_PROVIDER_SITE_OTHER): Payer: Medicare Other | Admitting: Urology

## 2021-10-09 VITALS — BP 123/81 | HR 53 | Wt 136.0 lb

## 2021-10-09 DIAGNOSIS — N401 Enlarged prostate with lower urinary tract symptoms: Secondary | ICD-10-CM | POA: Diagnosis not present

## 2021-10-09 DIAGNOSIS — N138 Other obstructive and reflux uropathy: Secondary | ICD-10-CM | POA: Diagnosis not present

## 2021-10-09 DIAGNOSIS — R972 Elevated prostate specific antigen [PSA]: Secondary | ICD-10-CM | POA: Diagnosis not present

## 2021-10-09 DIAGNOSIS — R3915 Urgency of urination: Secondary | ICD-10-CM

## 2021-10-09 DIAGNOSIS — Z87438 Personal history of other diseases of male genital organs: Secondary | ICD-10-CM | POA: Diagnosis not present

## 2021-10-09 NOTE — Progress Notes (Signed)
Urological Symptom Review  Patient is experiencing the following symptoms: Weak stream   Review of Systems  Gastrointestinal (upper)  : Negative for upper GI symptoms  Gastrointestinal (lower) : Negative for lower GI symptoms  Constitutional : Negative for symptoms  Skin: Negative for skin symptoms  Eyes: Negative for eye symptoms  Ear/Nose/Throat : Negative for Ear/Nose/Throat symptoms  Hematologic/Lymphatic: Negative for Hematologic/Lymphatic symptoms  Cardiovascular : Negative for cardiovascular symptoms  Respiratory : Negative for respiratory symptoms  Endocrine: Negative for endocrine symptoms  Musculoskeletal: Negative for musculoskeletal symptoms  Neurological: Negative for neurological symptoms  Psychologic: Negative for psychiatric symptoms  

## 2021-10-09 NOTE — Progress Notes (Signed)
Subjective: 1. Elevated PSA   2. BPH with urinary obstruction   3. Urgency of urination   4. History of prostatitis      10/09/21: Marc Jimenez returns today in f/u.  His PSA prior to this visit is 6.2 with a 9.5% f/t ratio.  This is stable over the past year.  He had a CT in September that showed prostate enlargement and right renal cysts.  He remains on tamsulosin.   He has a history of UTI's.  He has no new voiding complaints.  He has no hematuria.  His UA is clear.    04/10/21: Marc Jimenez returns today in f/u for his history of an elevated PSA.  The PSA is down to 6.0 with an 8.2% f/t ratio on 04/03/21.   He is on tamsulosin for BPH with BOO.  He had e. Coli on his culture on 01/09/21 and was treated with bactrim and his urine is clear today.   Gu hx: Marc Jimenez is a 71 yo who is sent for a PSA of 7.1 on labs from 10/11/20.  He was seen in [place] in 2019 for BPH with BOO and was given tamsulosin but failed to f/u.   He remains on tamsulosin.   He has a long history of alcoholism and was not felt to be a good candidate for PSA screening at that time.   He is currently in assisted living facility.  He had the PSA checked because of LUTS.  He has some frequency and urgency and UUI.  He has some hesitancy with an intermittent stream.   He has an adequate stream.  He has had no hematuria or dysuria.  He has had dark urine with some odor.  His UA today has Pyuria and bacteriuria.   He had a clear urine on 11/06/20 at [facility].  His Cr was 1.07.  He was seen for a sigmoid volvulus at that time.  On the CT his prostate was large but he wasn't in retention.  He didn't have a foley.   PVR today is 93ml. ROS:  ROS  No Known Allergies  Past Medical History:  Diagnosis Date   Alcohol abuse    Arrhythmia    Cataplexy    Glaucoma    Heart disease    Hypertension    Narcolepsy    Narcolepsy     Past Surgical History:  Procedure Laterality Date   FLEXIBLE SIGMOIDOSCOPY N/A 02/12/2021   Procedure: FLEXIBLE  SIGMOIDOSCOPY;  Surgeon: Harvel Quale, MD;  Location: [facility];  Service: Gastroenterology;  Laterality: N/A;   HERNIA REPAIR     PARTIAL COLECTOMY N/A 02/14/2021   Procedure: PARTIAL COLECTOMY;  Surgeon: Aviva Signs, MD;  Location: [facility] ORS;  Service: General;  Laterality: N/A;    Social History   Socioeconomic History   Marital status: Legally Separated    Spouse name: Not on file   Number of children: Not on file   Years of education: Not on file   Highest education level: Not on file  Occupational History   Not on file  Tobacco Use   Smoking status: Every Day    Packs/day: 1.00    Years: 30.00    Pack years: 30.00    Types: Cigarettes   Smokeless tobacco: Never  Vaping Use   Vaping Use: Never used  Substance and Sexual Activity   Alcohol use: Yes    Alcohol/week: 21.0 standard drinks    Types: 21 Cans of beer per  week    Comment: 12 pack of beer daily.    Drug use: Yes    Types: Marijuana   Sexual activity: Not on file  Other Topics Concern   Not on file  Social History Narrative   Not on file   Social Determinants of Health   Financial Resource Strain: Not on file  Food Insecurity: Not on file  Transportation Needs: Not on file  Physical Activity: Not on file  Stress: Not on file  Social Connections: Not on file  Intimate Partner Violence: Not on file    Family History  Family history unknown: Yes    Anti-infectives: Anti-infectives (From admission, onward)    None       Current Outpatient Medications  Medication Sig Dispense Refill   amLODipine (NORVASC) 5 MG tablet Take 1 tablet by mouth daily.     COMBIGAN 0.2-0.5 % ophthalmic solution Apply to eye.     folic acid (FOLVITE) 1 MG tablet Take 1 tablet (1 mg total) by mouth daily. 30 tablet 1   furosemide (LASIX) 20 MG tablet Take 10 mg by mouth daily.     gabapentin (NEURONTIN) 300 MG capsule Take 300 mg by mouth at bedtime.     LUMIGAN 0.01 % SOLN SMARTSIG:In Eye(s)      rosuvastatin (CRESTOR) 10 MG tablet Take 10 mg by mouth at bedtime.     tamsulosin (FLOMAX) 0.4 MG CAPS capsule Take 0.4 mg by mouth daily.     thiamine (VITAMIN B-1) 100 MG tablet Take 100 mg by mouth daily.     thiamine 100 MG tablet Take 1 tablet (100 mg total) by mouth daily. 30 tablet 1   tiZANidine (ZANAFLEX) 2 MG tablet Take 2 mg by mouth at bedtime.     No current facility-administered medications for this visit.     Objective: Vital signs in last 24 hours: BP 123/81    Pulse (!) 53    Wt 136 lb (61.7 kg)    BMI 21.30 kg/m   Intake/Output from previous day: No intake/output data recorded. Intake/Output this shift: @IOTHISSHIFT @   Physical Exam  Lab Results:  Recent Results (from the past 2160 hour(s))  PSA, total and free     Status: Abnormal   Collection Time: 10/02/21  2:03 PM  Result Value Ref Range   Prostate Specific Ag, Serum 6.2 (H) 0.0 - 4.0 ng/mL    Comment: Roche ECLIA methodology. According to the American Urological Association, Serum PSA should decrease and remain at undetectable levels after radical prostatectomy. The AUA defines biochemical recurrence as an initial PSA value 0.2 ng/mL or greater followed by a subsequent confirmatory PSA value 0.2 ng/mL or greater. Values obtained with different assay methods or kits cannot be used interchangeably. Results cannot be interpreted as absolute evidence of the presence or absence of malignant disease.    PSA, Free 0.59 N/A ng/mL    Comment: Roche ECLIA methodology.   PSA, Free Pct 9.5 %    Comment: The table below lists the probability of prostate cancer for men with non-suspicious DRE results and total PSA between 4 and 10 ng/mL, by patient age Ricci Barker, [place], [id number]).                   % Free PSA       50-64 yr        55-75 yr  0.00-10.00%        56%             55%                  10.01-15.00%        24%             35%                  15.01-20.00%        17%              23%                  20.01-25.00%        10%             20%                       >25.00%         5%              9% Please note:  Catalona et al did not make specific               recommendations regarding the use of               percent free PSA for any other population               of men.   Urinalysis, Routine w reflex microscopic     Status: None   Collection Time: 10/09/21  4:14 PM  Result Value Ref Range   Specific Gravity, UA 1.020 1.005 - 1.030   pH, UA 6.5 5.0 - 7.5   Color, UA Yellow Yellow   Appearance Ur Clear Clear   Leukocytes,UA Negative Negative   Protein,UA Negative Negative/Trace   Glucose, UA Negative Negative   Ketones, UA Negative Negative   RBC, UA Negative Negative   Bilirubin, UA Negative Negative   Urobilinogen, Ur 0.2 0.2 - 1.0 mg/dL   Nitrite, UA Negative Negative   Microscopic Examination Comment     Comment: Microscopic follows if indicated.      BMET No results for input(s): NA, K, CL, CO2, GLUCOSE, BUN, CREATININE, CALCIUM in the last 72 hours. PT/INR No results for input(s): LABPROT, INR in the last 72 hours. ABG No results for input(s): PHART, HCO3 in the last 72 hours.  Invalid input(s): PCO2, PO2 UA has pyuria and many bacteria but is nit-.  Studies/Results:    Assessment/Plan: Elevated PSA.   His PSA is stable with a f/t ratio of 9.5% but he is a very poor candidate for a biopsy so with the stability over the past year I will just repeat a PSA in a year    BPH with BOO and OAB.  He will continue the tamsulosin.    History of UTI.    UA is clear.   No orders of the defined types were placed in this encounter.     Orders Placed This Encounter  Procedures   PSA, total and free    Standing Status:   Future    Standing Expiration Date:   10/09/2022   Urinalysis, Routine w reflex microscopic     Return in about 1 year (around 10/09/2022) for with PSA.    CC: Dr. Stoney Bang.      Irine Seal 10/10/2021 [phone] ID: Marc Jimenez, male   DOB: 1950/11/27, 71 y.o.  MRN: [id number]

## 2021-10-10 LAB — URINALYSIS, ROUTINE W REFLEX MICROSCOPIC
Bilirubin, UA: NEGATIVE
Glucose, UA: NEGATIVE
Ketones, UA: NEGATIVE
Leukocytes,UA: NEGATIVE
Nitrite, UA: NEGATIVE
Protein,UA: NEGATIVE
RBC, UA: NEGATIVE
Specific Gravity, UA: 1.02 (ref 1.005–1.030)
Urobilinogen, Ur: 0.2 mg/dL (ref 0.2–1.0)
pH, UA: 6.5 (ref 5.0–7.5)

## 2021-10-28 DIAGNOSIS — N400 Enlarged prostate without lower urinary tract symptoms: Secondary | ICD-10-CM | POA: Diagnosis not present

## 2021-10-28 DIAGNOSIS — I10 Essential (primary) hypertension: Secondary | ICD-10-CM | POA: Diagnosis not present

## 2021-10-28 DIAGNOSIS — G3184 Mild cognitive impairment, so stated: Secondary | ICD-10-CM | POA: Diagnosis not present

## 2021-11-04 DIAGNOSIS — N400 Enlarged prostate without lower urinary tract symptoms: Secondary | ICD-10-CM | POA: Diagnosis not present

## 2021-11-04 DIAGNOSIS — I700 Atherosclerosis of aorta: Secondary | ICD-10-CM | POA: Diagnosis not present

## 2021-11-04 DIAGNOSIS — I10 Essential (primary) hypertension: Secondary | ICD-10-CM | POA: Diagnosis not present

## 2021-11-04 DIAGNOSIS — Z136 Encounter for screening for cardiovascular disorders: Secondary | ICD-10-CM | POA: Diagnosis not present

## 2021-11-04 DIAGNOSIS — F17210 Nicotine dependence, cigarettes, uncomplicated: Secondary | ICD-10-CM | POA: Diagnosis not present

## 2021-11-04 DIAGNOSIS — G3184 Mild cognitive impairment, so stated: Secondary | ICD-10-CM | POA: Diagnosis not present

## 2021-11-04 DIAGNOSIS — Z87891 Personal history of nicotine dependence: Secondary | ICD-10-CM | POA: Diagnosis not present

## 2021-11-06 DIAGNOSIS — N400 Enlarged prostate without lower urinary tract symptoms: Secondary | ICD-10-CM | POA: Diagnosis not present

## 2021-11-06 DIAGNOSIS — G3184 Mild cognitive impairment, so stated: Secondary | ICD-10-CM | POA: Diagnosis not present

## 2021-11-06 DIAGNOSIS — I10 Essential (primary) hypertension: Secondary | ICD-10-CM | POA: Diagnosis not present

## 2021-11-10 DIAGNOSIS — N400 Enlarged prostate without lower urinary tract symptoms: Secondary | ICD-10-CM | POA: Diagnosis not present

## 2021-11-10 DIAGNOSIS — G3184 Mild cognitive impairment, so stated: Secondary | ICD-10-CM | POA: Diagnosis not present

## 2021-11-10 DIAGNOSIS — I10 Essential (primary) hypertension: Secondary | ICD-10-CM | POA: Diagnosis not present

## 2021-11-13 DIAGNOSIS — I10 Essential (primary) hypertension: Secondary | ICD-10-CM | POA: Diagnosis not present

## 2021-11-13 DIAGNOSIS — N400 Enlarged prostate without lower urinary tract symptoms: Secondary | ICD-10-CM | POA: Diagnosis not present

## 2021-11-13 DIAGNOSIS — G3184 Mild cognitive impairment, so stated: Secondary | ICD-10-CM | POA: Diagnosis not present

## 2021-11-17 DIAGNOSIS — N400 Enlarged prostate without lower urinary tract symptoms: Secondary | ICD-10-CM | POA: Diagnosis not present

## 2021-11-17 DIAGNOSIS — M79674 Pain in right toe(s): Secondary | ICD-10-CM | POA: Diagnosis not present

## 2021-11-17 DIAGNOSIS — G3184 Mild cognitive impairment, so stated: Secondary | ICD-10-CM | POA: Diagnosis not present

## 2021-11-17 DIAGNOSIS — M79675 Pain in left toe(s): Secondary | ICD-10-CM | POA: Diagnosis not present

## 2021-11-17 DIAGNOSIS — B351 Tinea unguium: Secondary | ICD-10-CM | POA: Diagnosis not present

## 2021-11-17 DIAGNOSIS — I10 Essential (primary) hypertension: Secondary | ICD-10-CM | POA: Diagnosis not present

## 2021-11-19 DIAGNOSIS — N400 Enlarged prostate without lower urinary tract symptoms: Secondary | ICD-10-CM | POA: Diagnosis not present

## 2021-11-19 DIAGNOSIS — G3184 Mild cognitive impairment, so stated: Secondary | ICD-10-CM | POA: Diagnosis not present

## 2021-11-19 DIAGNOSIS — I10 Essential (primary) hypertension: Secondary | ICD-10-CM | POA: Diagnosis not present

## 2022-01-20 DIAGNOSIS — M79675 Pain in left toe(s): Secondary | ICD-10-CM | POA: Diagnosis not present

## 2022-01-20 DIAGNOSIS — B351 Tinea unguium: Secondary | ICD-10-CM | POA: Diagnosis not present

## 2022-01-20 DIAGNOSIS — M79674 Pain in right toe(s): Secondary | ICD-10-CM | POA: Diagnosis not present

## 2022-02-26 DIAGNOSIS — H548 Legal blindness, as defined in USA: Secondary | ICD-10-CM | POA: Diagnosis not present

## 2022-02-26 DIAGNOSIS — H401133 Primary open-angle glaucoma, bilateral, severe stage: Secondary | ICD-10-CM | POA: Diagnosis not present

## 2022-03-17 ENCOUNTER — Emergency Department (HOSPITAL_COMMUNITY): Payer: Medicare Other

## 2022-03-17 ENCOUNTER — Inpatient Hospital Stay (HOSPITAL_COMMUNITY)
Admission: EM | Admit: 2022-03-17 | Discharge: 2022-03-21 | DRG: 871 | Disposition: A | Discharge status: Home Health | Payer: Medicare Other | Source: Skilled Nursing Facility | Attending: Internal Medicine | Admitting: Internal Medicine

## 2022-03-17 ENCOUNTER — Other Ambulatory Visit: Payer: Self-pay

## 2022-03-17 ENCOUNTER — Encounter (HOSPITAL_COMMUNITY): Payer: Self-pay

## 2022-03-17 DIAGNOSIS — N3001 Acute cystitis with hematuria: Secondary | ICD-10-CM | POA: Diagnosis not present

## 2022-03-17 DIAGNOSIS — Z79899 Other long term (current) drug therapy: Secondary | ICD-10-CM

## 2022-03-17 DIAGNOSIS — Z9049 Acquired absence of other specified parts of digestive tract: Secondary | ICD-10-CM

## 2022-03-17 DIAGNOSIS — A419 Sepsis, unspecified organism: Secondary | ICD-10-CM | POA: Diagnosis present

## 2022-03-17 DIAGNOSIS — R4182 Altered mental status, unspecified: Principal | ICD-10-CM

## 2022-03-17 DIAGNOSIS — I4891 Unspecified atrial fibrillation: Secondary | ICD-10-CM | POA: Diagnosis present

## 2022-03-17 DIAGNOSIS — I4892 Unspecified atrial flutter: Secondary | ICD-10-CM | POA: Diagnosis not present

## 2022-03-17 DIAGNOSIS — A4151 Sepsis due to Escherichia coli [E. coli]: Secondary | ICD-10-CM | POA: Diagnosis not present

## 2022-03-17 DIAGNOSIS — M549 Dorsalgia, unspecified: Secondary | ICD-10-CM | POA: Diagnosis not present

## 2022-03-17 DIAGNOSIS — I6381 Other cerebral infarction due to occlusion or stenosis of small artery: Secondary | ICD-10-CM | POA: Diagnosis not present

## 2022-03-17 DIAGNOSIS — N390 Urinary tract infection, site not specified: Secondary | ICD-10-CM | POA: Diagnosis present

## 2022-03-17 DIAGNOSIS — I10 Essential (primary) hypertension: Secondary | ICD-10-CM | POA: Diagnosis present

## 2022-03-17 DIAGNOSIS — Z1611 Resistance to penicillins: Secondary | ICD-10-CM | POA: Diagnosis not present

## 2022-03-17 DIAGNOSIS — N3000 Acute cystitis without hematuria: Secondary | ICD-10-CM

## 2022-03-17 DIAGNOSIS — G9341 Metabolic encephalopathy: Secondary | ICD-10-CM | POA: Diagnosis present

## 2022-03-17 DIAGNOSIS — F17210 Nicotine dependence, cigarettes, uncomplicated: Secondary | ICD-10-CM | POA: Diagnosis present

## 2022-03-17 DIAGNOSIS — M545 Low back pain, unspecified: Secondary | ICD-10-CM | POA: Diagnosis not present

## 2022-03-17 LAB — URINALYSIS, ROUTINE W REFLEX MICROSCOPIC
Bilirubin Urine: NEGATIVE
Glucose, UA: NEGATIVE mg/dL
Ketones, ur: 5 mg/dL — AB
Nitrite: POSITIVE — AB
Protein, ur: 30 mg/dL — AB
Specific Gravity, Urine: 1.02 (ref 1.005–1.030)
pH: 7 (ref 5.0–8.0)

## 2022-03-17 LAB — CBC WITH DIFFERENTIAL/PLATELET
Abs Immature Granulocytes: 0.06 10*3/uL (ref 0.00–0.07)
Basophils Absolute: 0 10*3/uL (ref 0.0–0.1)
Basophils Relative: 0 %
Eosinophils Absolute: 0 10*3/uL (ref 0.0–0.5)
Eosinophils Relative: 0 %
HCT: 43.8 % (ref 39.0–52.0)
Hemoglobin: 14.4 g/dL (ref 13.0–17.0)
Immature Granulocytes: 0 %
Lymphocytes Relative: 11 %
Lymphs Abs: 1.6 10*3/uL (ref 0.7–4.0)
MCH: 29.9 pg (ref 26.0–34.0)
MCHC: 32.9 g/dL (ref 30.0–36.0)
MCV: 90.9 fL (ref 80.0–100.0)
Monocytes Absolute: 0.9 10*3/uL (ref 0.1–1.0)
Monocytes Relative: 6 %
Neutro Abs: 11.8 10*3/uL — ABNORMAL HIGH (ref 1.7–7.7)
Neutrophils Relative %: 83 %
Platelets: 193 10*3/uL (ref 150–400)
RBC: 4.82 MIL/uL (ref 4.22–5.81)
RDW: 14.3 % (ref 11.5–15.5)
WBC: 14.4 10*3/uL — ABNORMAL HIGH (ref 4.0–10.5)
nRBC: 0 % (ref 0.0–0.2)

## 2022-03-17 LAB — BASIC METABOLIC PANEL
Anion gap: 9 (ref 5–15)
BUN: 16 mg/dL (ref 8–23)
CO2: 23 mmol/L (ref 22–32)
Calcium: 9.7 mg/dL (ref 8.9–10.3)
Chloride: 108 mmol/L (ref 98–111)
Creatinine, Ser: 1.06 mg/dL (ref 0.61–1.24)
GFR, Estimated: >60 mL/min (ref >60)
Glucose, Bld: 114 mg/dL — ABNORMAL HIGH (ref 70–99)
Potassium: 4 mmol/L (ref 3.5–5.1)
Sodium: 140 mmol/L (ref 135–145)

## 2022-03-17 LAB — LACTIC ACID, PLASMA
Lactic Acid, Venous: 1.4 mmol/L (ref 0.5–1.9)
Lactic Acid, Venous: 1.2 mmol/L (ref 0.5–1.9)

## 2022-03-17 LAB — TROPONIN I (HIGH SENSITIVITY): Troponin I (High Sensitivity): 14 ng/L (ref <18)

## 2022-03-17 MED ORDER — TIMOLOL MALEATE 0.5 % OP SOLN
1.0000 [drp] | Freq: Two times a day (BID) | OPHTHALMIC | Status: DC
  Administered 2022-03-17 – 2022-03-21 (×8): 1 [drp] via OPHTHALMIC
  Filled 2022-03-17: qty 5

## 2022-03-17 MED ORDER — ACETAMINOPHEN 650 MG RE SUPP: 650.0000 mg | Freq: Four times a day (QID) | RECTAL | Status: DC | PRN

## 2022-03-17 MED ORDER — CEFTRIAXONE SODIUM 1 G IJ SOLR
1.0000 g | Freq: Once | INTRAMUSCULAR | Status: AC
Start: 2022-03-17 — End: 2022-03-17
  Administered 2022-03-17: 1 g via INTRAVENOUS
  Filled 2022-03-17: qty 10

## 2022-03-17 MED ORDER — BRIMONIDINE TARTRATE-TIMOLOL 0.2-0.5 % OP SOLN: 1.0000 [drp] | Freq: Two times a day (BID) | OPHTHALMIC | Status: DC

## 2022-03-17 MED ORDER — ONDANSETRON HCL 4 MG PO TABS: 4.0000 mg | ORAL_TABLET | Freq: Four times a day (QID) | ORAL | Status: DC | PRN

## 2022-03-17 MED ORDER — SODIUM CHLORIDE 0.9 % IV SOLN
1.0000 g | INTRAVENOUS | Status: DC
Start: 2022-03-18 — End: 2022-03-19
  Administered 2022-03-18: 1 g via INTRAVENOUS
  Filled 2022-03-17: qty 10

## 2022-03-17 MED ORDER — BRIMONIDINE TARTRATE 0.2 % OP SOLN
1.0000 [drp] | Freq: Two times a day (BID) | OPHTHALMIC | Status: DC
  Administered 2022-03-17 – 2022-03-21 (×8): 1 [drp] via OPHTHALMIC
  Filled 2022-03-17: qty 5

## 2022-03-17 MED ORDER — ENOXAPARIN SODIUM 40 MG/0.4ML IJ SOSY
40.0000 mg | PREFILLED_SYRINGE | INTRAMUSCULAR | Status: DC
Start: 2022-03-17 — End: 2022-03-20
  Administered 2022-03-17 – 2022-03-19 (×3): 40 mg via SUBCUTANEOUS
  Filled 2022-03-17 (×3): qty 0.4

## 2022-03-17 MED ORDER — POLYETHYLENE GLYCOL 3350 17 G PO PACK: 17.0000 g | PACK | Freq: Every day | ORAL | Status: DC | PRN

## 2022-03-17 MED ORDER — TAMSULOSIN HCL 0.4 MG PO CAPS
0.4000 mg | ORAL_CAPSULE | Freq: Every day | ORAL | Status: DC
  Administered 2022-03-18 – 2022-03-21 (×4): 0.4 mg via ORAL
  Filled 2022-03-17 (×4): qty 1

## 2022-03-17 MED ORDER — ONDANSETRON HCL 4 MG/2ML IJ SOLN: 4.0000 mg | Freq: Four times a day (QID) | INTRAMUSCULAR | Status: DC | PRN

## 2022-03-17 MED ORDER — AMLODIPINE BESYLATE 5 MG PO TABS
5.0000 mg | ORAL_TABLET | Freq: Every day | ORAL | Status: DC
  Administered 2022-03-18 – 2022-03-21 (×4): 5 mg via ORAL
  Filled 2022-03-17 (×4): qty 1

## 2022-03-17 MED ORDER — LACTATED RINGERS IV SOLN
INTRAVENOUS | Status: AC
Start: 2022-03-17 — End: 2022-03-18

## 2022-03-17 MED ORDER — ASPIRIN 81 MG PO CHEW
81.0000 mg | CHEWABLE_TABLET | Freq: Once | ORAL | Status: AC
  Administered 2022-03-17: 81 mg via ORAL
  Filled 2022-03-17: qty 1

## 2022-03-17 MED ORDER — QUETIAPINE FUMARATE 25 MG PO TABS
12.5000 mg | ORAL_TABLET | Freq: Every day | ORAL | Status: DC
  Administered 2022-03-18 – 2022-03-20 (×3): 12.5 mg via ORAL
  Filled 2022-03-17 (×4): qty 1

## 2022-03-17 MED ORDER — ACETAMINOPHEN 325 MG PO TABS
650.0000 mg | ORAL_TABLET | Freq: Four times a day (QID) | ORAL | Status: DC | PRN
  Administered 2022-03-18: 650 mg via ORAL
  Filled 2022-03-17: qty 2
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
## 2022-03-17 NOTE — Assessment & Plan Note (Signed)
UA suggestive of UTI, urine cultures.  Tmax of 100.1.  Leukocytosis of 14.4.  Respiratory rate of 18-24.  Meeting sepsis criteria.  Lactic acid 1.4 > 1.2.  Last urine culture 02/2021, grew pansensitive E. coli.   Per nursing home patient was treated with Keflex 6/23 to 6/28 for UTI -IV ceftriaxone 1 g daily -Follow-up blood and urine cultures

## 2022-03-17 NOTE — ED Provider Notes (Signed)
[facility] EMERGENCY DEPARTMENT Provider Note   CSN: [id number] Arrival date & time: 03/17/22  1313     History Chief Complaint  Patient presents with   Back Pain    Marc Jimenez is a 71 y.o. male patient with history of heart disease, hypertension who presents to the emergency department with lower back pain.  Patient unable to provide a lot of history as he is very slow to respond and replies with one-word answers.  I spoke with the sister which is highlighted in the ED course.  She was unable to tell me his overall baseline status.  Per EMS, patient was alert and oriented x3 and he was being treated for a urinary tract infection at his facility.   Back Pain      Home Medications Prior to Admission medications   Medication Sig Start Date End Date Taking? Authorizing Provider  amLODipine (NORVASC) 5 MG tablet Take 1 tablet by mouth daily. 01/07/21  Yes [provider]  COMBIGAN 0.2-0.5 % ophthalmic solution Place 1 drop into both eyes every 12 (twelve) hours. 09/18/21  Yes [provider]  folic acid (FOLVITE) 1 MG tablet Take 1 tablet (1 mg total) by mouth daily. 02/18/21  Yes Barton Dubois, MD  furosemide (LASIX) 20 MG tablet Take 10 mg by mouth daily. 01/07/21  Yes [provider]  LUMIGAN 0.01 % SOLN Place 1 drop into both eyes at bedtime. 09/18/21  Yes [provider]  QUEtiapine (SEROQUEL) 25 MG tablet Take 12.5 mg by mouth at bedtime. 03/13/22  Yes [provider]  rosuvastatin (CRESTOR) 10 MG tablet Take 10 mg by mouth at bedtime. 09/11/21  Yes [provider]  tamsulosin (FLOMAX) 0.4 MG CAPS capsule Take 0.4 mg by mouth daily. 01/07/21  Yes [provider]  thiamine (VITAMIN B-1) 100 MG tablet Take 100 mg by mouth daily. 04/16/21  Yes [provider]  tiZANidine (ZANAFLEX) 2 MG tablet Take 2 mg by mouth at bedtime. 06/04/21  Yes [provider]      Allergies    Patient has no known allergies.     Review of Systems   Review of Systems  Musculoskeletal:  Positive for back pain.  All other systems reviewed and are negative.   Physical Exam Updated Vital Signs BP (!) 147/80   Pulse 65   Temp 100.1 F (37.8 C) (Rectal)   Resp (!) 21   Ht '5\' 7"'$  (1.702 m)   Wt 59 kg   SpO2 100%   BMI 20.36 kg/m  Physical Exam Vitals and nursing note reviewed.  Constitutional:      General: He is not in acute distress.    Appearance: Normal appearance.  HENT:     Head: Normocephalic and atraumatic.  Eyes:     General:        Right eye: No discharge.        Left eye: No discharge.  Cardiovascular:     Comments: Regular rate and rhythm.  S1/S2 are distinct without any evidence of murmur, rubs, or gallops.  Radial pulses are 2+ bilaterally.  Dorsalis pedis pulses are 2+ bilaterally.  No evidence of pedal edema. Pulmonary:     Comments: Clear to auscultation bilaterally.  Normal effort.  No respiratory distress.  No evidence of wheezes, rales, or rhonchi heard throughout. Abdominal:     General: Abdomen is flat. Bowel sounds are normal. There is no distension.     Tenderness: There is no abdominal tenderness. There  is no guarding or rebound.  Musculoskeletal:        General: Normal range of motion.     Cervical back: Neck supple.  Skin:    General: Skin is warm and dry.     Findings: No rash.  Neurological:     General: No focal deficit present.     Mental Status: He is alert. He is confused.  Psychiatric:        Mood and Affect: Mood normal.        Behavior: Behavior normal.     ED Results / Procedures / Treatments   Labs (all labs ordered are listed, but only abnormal results are displayed) Labs Reviewed  URINALYSIS, ROUTINE W REFLEX MICROSCOPIC - Abnormal; Notable for the following components:      Result Value   APPearance HAZY (*)    Hgb urine dipstick SMALL (*)    Ketones, ur 5 (*)    Protein, ur 30 (*)    Nitrite POSITIVE (*)    Leukocytes,Ua SMALL (*)     Bacteria, UA MANY (*)    All other components within normal limits  CBC WITH DIFFERENTIAL/PLATELET - Abnormal; Notable for the following components:   WBC 14.4 (*)    Neutro Abs 11.8 (*)    All other components within normal limits  BASIC METABOLIC PANEL - Abnormal; Notable for the following components:   Glucose, Bld 114 (*)    All other components within normal limits  CULTURE, BLOOD (ROUTINE X 2)  CULTURE, BLOOD (ROUTINE X 2)  URINE CULTURE  LACTIC ACID, PLASMA  LACTIC ACID, PLASMA    EKG None  Radiology CT Head Wo Contrast  Result Date: 03/17/2022 CLINICAL DATA:  Mental status.  Recent urinary tract infection. EXAM: CT HEAD WITHOUT CONTRAST TECHNIQUE: Contiguous axial images were obtained from the base of the skull through the vertex without intravenous contrast. RADIATION DOSE REDUCTION: This exam was performed according to the departmental dose-optimization program which includes automated exposure control, adjustment of the mA and/or kV according to patient size and/or use of iterative reconstruction technique. COMPARISON:  CT head without contrast 12/31/2017 at [facility] [place]. FINDINGS: Brain: Moderate generalized atrophy and white matter hypoattenuation is present bilaterally. A remote lacunar infarct involves the anterior limb of the left internal capsule and caudate head. Basal ganglia are otherwise within normal limits. No acute infarct, hemorrhage, or mass lesion is present. The ventricles are proportionate to the degree of atrophy. No significant extraaxial fluid collection is present. Vascular: A vascular calcifications are present within the cavernous internal carotid arteries. No hyperdense vessel is present. Skull: Calvarium is intact. No focal lytic or blastic lesions are present. No significant extracranial soft tissue lesion is present. Sinuses/Orbits: The paranasal sinuses and mastoid air cells are clear. The globes and orbits are within normal limits. IMPRESSION: 1. No  acute intracranial abnormality. 2. Moderate generalized atrophy and white matter disease likely reflects the sequela of chronic microvascular ischemia. 3. 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 Electronically Signed   By: San Morelle M.D.   On: 03/17/2022 14:47    Procedures Procedures    Medications Ordered in ED Medications  cefTRIAXone (ROCEPHIN) 1 g in sodium chloride 0.9 % 100 mL IVPB (0 g Intravenous Stopped 03/17/22 1751)  aspirin chewable tablet 81 mg (81 mg Oral Given 03/17/22 1757)    ED Course/ Medical Decision Making/ A&P Clinical Course as of 03/17/22 1821  Tue Mar 17, 2022  1407 I spoke with the sister who states that the patient has been increasingly confused over the last 3 to 4 weeks.  As far she knows, he has been treated for UTI but does not know the extent of that treatment. [CF]  3299 I spoke with [facility]. Keflex from June 23rd-28th.  They were stating that the staff was stating that the patient has been having very strong odorous urine and been confused for several weeks.  Today he was complaining of some low back pain in addition to chest pain.  Patient did not elicit any chest pain to me. [CF]  1703 Urinalysis, Routine w reflex microscopic Urine, Clean Catch(!) There is definitely evidence of urinary tract infection with a lot of pyuria. [CF]  2426 Basic metabolic panel(!) Elevated glucose but no other abnormalities. [CF]  1708 CBC with Differential(!) There is evidence of leukocytosis.  No other abnormalities. [CF]  1708 Lactic acid, plasma Initial lactic normal. [CF]  1708 Culture, blood (routine x 2) Blood cultures obtained and pending. [CF]  1708 CT Head Wo Contrast I personally reviewed the CT head.  No signs of intracranial hemorrhage.  Radiologist is calling N/A lacunar infarct.  Patient does not have a history of stroke nor is he on any goal-directed therapy for  stroke.  Family agrees that the patient has no history of stroke as well. [CF]  1748 I spoke with Dr. Cheral Marker with neurology about the lacunar infarct on CT imaging.  Since this is not acute he recommends 81 mg aspirin and follow-up in the outpatient setting.  Likely not the cause of his confusion. [CF]  1820 I spoke with Dr. Denton Brick with [facility] hospitalist who agrees to admit the patient. [CF]    Clinical Course User Index [CF] Hendricks Limes, PA-C                           Medical Decision Making CLINTON DRAGONE is a 71 y.o. male patient who presents to the emergency department for further evaluation of lower back pain and increasing confusion.  Patient only responding with one-word answers.  Is difficult to assess if this is the patient's baseline after talking with family.  Patient's only complaining of lower back pain.  Ongoing to get sepsis labs in addition to a rectal temperature as he does feel slightly warm.  On reevaluation, patient is about the same.  Given the findings on labs and imaging I do feel the patient would likely benefit from further evaluation in the hospital considering that the patient is already been on antibiotics and still has a recurrent urinary tract infection.  He would likely benefit from IV antibiotic therapy.  I will work on getting him admitted to the hospitalist service.  Amount and/or Complexity of Data Reviewed Labs: ordered. Decision-making details documented in ED Course. Radiology: ordered. Decision-making details documented in ED Course.  Risk OTC drugs. Decision regarding hospitalization.   Final Clinical Impression(s) / ED Diagnoses Final diagnoses:  Altered mental status, unspecified altered mental status type  Acute cystitis with hematuria    Rx / DC Orders ED Discharge Orders     None         Hendricks Limes, [place] 03/17/22 1821    Fredia Sorrow, MD 03/20/22 1601

## 2022-03-17 NOTE — Assessment & Plan Note (Signed)
Resume Norvasc 5 mg daily

## 2022-03-17 NOTE — Assessment & Plan Note (Addendum)
Reported confused speech.  Patient answering select few questions on my evaluation.  Head CT without acute abnormality, shows chronic lacunar infarct, and generalized atrophy.  Likely etiology UTI, but question baseline onset of dementia. -Will resume Seroquel -Initially reported chest pain at nursing home, patient subsequently denied, will check troponins x 2 and EKG.

## 2022-03-17 NOTE — ED Triage Notes (Signed)
Patient brought in by [facility] for lower back pain.  Staff states that the patient was diagnosed with a UTI a week ago and treated with antibiotics and they don't feel it is helping.   Originally called out for chest pain but patient denies any chest pain at this time.

## 2022-03-18 DIAGNOSIS — R4182 Altered mental status, unspecified: Secondary | ICD-10-CM

## 2022-03-18 DIAGNOSIS — G9341 Metabolic encephalopathy: Secondary | ICD-10-CM | POA: Diagnosis present

## 2022-03-18 DIAGNOSIS — I4891 Unspecified atrial fibrillation: Secondary | ICD-10-CM | POA: Diagnosis present

## 2022-03-18 DIAGNOSIS — F17210 Nicotine dependence, cigarettes, uncomplicated: Secondary | ICD-10-CM | POA: Diagnosis present

## 2022-03-18 DIAGNOSIS — A419 Sepsis, unspecified organism: Secondary | ICD-10-CM | POA: Diagnosis not present

## 2022-03-18 DIAGNOSIS — I4892 Unspecified atrial flutter: Secondary | ICD-10-CM | POA: Diagnosis present

## 2022-03-18 DIAGNOSIS — N390 Urinary tract infection, site not specified: Secondary | ICD-10-CM | POA: Diagnosis not present

## 2022-03-18 DIAGNOSIS — N3001 Acute cystitis with hematuria: Secondary | ICD-10-CM | POA: Diagnosis present

## 2022-03-18 DIAGNOSIS — Z79899 Other long term (current) drug therapy: Secondary | ICD-10-CM | POA: Diagnosis not present

## 2022-03-18 DIAGNOSIS — K760 Fatty (change of) liver, not elsewhere classified: Secondary | ICD-10-CM | POA: Diagnosis not present

## 2022-03-18 DIAGNOSIS — Z1611 Resistance to penicillins: Secondary | ICD-10-CM | POA: Diagnosis present

## 2022-03-18 DIAGNOSIS — Z9049 Acquired absence of other specified parts of digestive tract: Secondary | ICD-10-CM | POA: Diagnosis not present

## 2022-03-18 DIAGNOSIS — I10 Essential (primary) hypertension: Secondary | ICD-10-CM | POA: Diagnosis present

## 2022-03-18 DIAGNOSIS — N289 Disorder of kidney and ureter, unspecified: Secondary | ICD-10-CM | POA: Diagnosis not present

## 2022-03-18 DIAGNOSIS — A4151 Sepsis due to Escherichia coli [E. coli]: Secondary | ICD-10-CM | POA: Diagnosis present

## 2022-03-18 LAB — BASIC METABOLIC PANEL
Anion gap: 8 (ref 5–15)
BUN: 14 mg/dL (ref 8–23)
CO2: 22 mmol/L (ref 22–32)
Calcium: 8.9 mg/dL (ref 8.9–10.3)
Chloride: 109 mmol/L (ref 98–111)
Creatinine, Ser: 0.88 mg/dL (ref 0.61–1.24)
GFR, Estimated: >60 mL/min (ref >60)
Glucose, Bld: 108 mg/dL — ABNORMAL HIGH (ref 70–99)
Potassium: 3.6 mmol/L (ref 3.5–5.1)
Sodium: 139 mmol/L (ref 135–145)

## 2022-03-18 LAB — CBC
HCT: 37.6 % — ABNORMAL LOW (ref 39.0–52.0)
Hemoglobin: 12.6 g/dL — ABNORMAL LOW (ref 13.0–17.0)
MCH: 29.7 pg (ref 26.0–34.0)
MCHC: 33.5 g/dL (ref 30.0–36.0)
MCV: 88.7 fL (ref 80.0–100.0)
Platelets: 184 10*3/uL (ref 150–400)
RBC: 4.24 MIL/uL (ref 4.22–5.81)
RDW: 14.3 % (ref 11.5–15.5)
WBC: 13.5 10*3/uL — ABNORMAL HIGH (ref 4.0–10.5)
nRBC: 0 % (ref 0.0–0.2)

## 2022-03-18 LAB — HIV ANTIBODY (ROUTINE TESTING W REFLEX): HIV Screen 4th Generation wRfx: NONREACTIVE

## 2022-03-18 LAB — GLUCOSE, CAPILLARY: Glucose-Capillary: 122 mg/dL — ABNORMAL HIGH (ref 70–99)

## 2022-03-18 MED ORDER — ADULT MULTIVITAMIN W/MINERALS CH
1.0000 | ORAL_TABLET | Freq: Every day | ORAL | Status: DC
  Administered 2022-03-18 – 2022-03-21 (×4): 1 via ORAL
  Filled 2022-03-18 (×4): qty 1

## 2022-03-18 MED ORDER — ENSURE ENLIVE PO LIQD
237.0000 mL | Freq: Two times a day (BID) | ORAL | Status: DC
  Administered 2022-03-18 – 2022-03-21 (×3): 237 mL via ORAL

## 2022-03-18 NOTE — Progress Notes (Signed)
Pt newly admitted to room 332 prior to this nurse arrival. Admission info obtained from pt's nephew/listed contact-Charles Hairston. Pt answering yes and no at times, at other times pt mute. Delayed responses noted.

## 2022-03-18 NOTE — Progress Notes (Signed)
Initial Nutrition Assessment  DOCUMENTATION CODES:      INTERVENTION:  Ensure Enlive po BID, each supplement provides 350 kcal and 20 grams of protein.   Assist with feeding all meals  NUTRITION DIAGNOSIS:   Inadequate oral intake related to lethargy/confusion, acute illness as evidenced by meal completion < 25%.  GOAL:  Patient will meet greater than or equal to 90% of their needs  MONITOR:  PO intake, Supplement acceptance, Labs, Weight trends  REASON FOR ASSESSMENT:   Malnutrition Screening Tool    ASSESSMENT: Patient is a 71 yo male with hx of hypertension, narcolepsy, glaucoma, heart disease. Presents from [facility] with altered mental status.  Patient refused breakfast but drank and Ensure per nursing. Patient minimally responsive to RD questions. His lunch tray is here and offered to provide set up but patient says he is sleepy.  History of weights-stable between 61-63 kg the past year.  Medications: norvasc, seroquel, flomax.      Latest Ref Rng & Units 03/18/2022    6:05 AM 03/17/2022    2:13 PM 05/30/2021    2:41 PM  BMP  Glucose 70 - 99 mg/dL 108  114  110   BUN 8 - 23 mg/dL '14  16  18   '$ Creatinine 0.61 - 1.24 mg/dL 0.88  1.06  1.01   Sodium 135 - 145 mmol/L 139  140  136   Potassium 3.5 - 5.1 mmol/L 3.6  4.0  4.0   Chloride 98 - 111 mmol/L 109  108  105   CO2 22 - 32 mmol/L '22  23  27   '$ Calcium 8.9 - 10.3 mg/dL 8.9  9.7  9.0       NUTRITION - FOCUSED PHYSICAL EXAM: Nutrition-Focused physical exam completed. Findings are mild orbital fat depletion, mild clavicle and acromion bone region muscle depletion, and no edema.     Diet Order:   Diet Order             Diet Heart Room service appropriate? Yes; Fluid consistency: Thin  Diet effective now                   EDUCATION NEEDS:  Not appropriate for education at this time  Skin:  Skin Assessment: Reviewed RN Assessment  Last BM:  PTA  Height:   Ht Readings from Last 1 Encounters:   03/17/22 '5\' 7"'$  (1.702 m)    Weight:   Wt Readings from Last 1 Encounters:  03/17/22 62.6 kg    Ideal Body Weight:   67 kg  BMI:  Body mass index is 21.62 kg/m.  Estimated Nutritional Needs:   Kcal:  1900-2000  Protein:  82-88 gr  Fluid:  2 liters daily  Colman Cater MS,RD,CSG,LDN Contact: Shea Evans

## 2022-03-18 NOTE — Progress Notes (Signed)
Patient is refusing to eat lunch. Will offer an Ensure.

## 2022-03-18 NOTE — Progress Notes (Signed)
  Progress Note   Patient: Marc Jimenez XFG:[id number] DOB: 03-19-1951 DOA: 03/17/2022     0 DOS: the patient was seen and examined on 03/18/2022   Brief hospital course: 71 y.o. male with medical history significant for hypertension, alcohol abuse, atrial flutter. Patient was brought to the ED from nursing home with reports of confusion. Pt was found to have UTI  Assessment and Plan: * Acute metabolic encephalopathy -Reported confused speech. -Head CT without acute abnormality, shows chronic lacunar infarct, and generalized atrophy.   -Likely etiology UTI, -continued Seroquel  UTI (urinary tract infection) with sepsis present on admit - UA suggestive of UTI, urine cultures.  Tmax of 100.1.  Leukocytosis of 14.4.  Respiratory rate of 18-24.  Meeting sepsis criteria.  Lactic acid 1.4 > 1.2.   -Last urine culture 02/2021, grew pansensitive E. coli.   Per nursing home patient was treated with Keflex 6/23 to 6/28 for UTI -IV ceftriaxone 1 g daily -Follow-up blood and urine cultures -WBC trending down  HTN (hypertension) -Resumed Norvasc 5 mg daily -BP stable      Subjective: Difficult to assess given confusion  Physical Exam: Vitals:   03/17/22 1922 03/17/22 2221 03/18/22 0435 03/18/22 0910  BP: 134/83 135/60 (!) 141/79 138/76  Pulse: 61 70 70   Resp: '18 20 17   '$ Temp: 98.9 F (37.2 C) (!) 97.4 F (36.3 C) 98.5 F (36.9 C)   TempSrc: Rectal Oral    SpO2: 100% 99% 96%   Weight: 62.6 kg     Height: '5\' 7"'$  (1.702 m)      General exam: Awake, laying in bed, in nad Respiratory system: Normal respiratory effort, no wheezing Cardiovascular system: regular rate, s1, s2 Gastrointestinal system: Soft, nondistended, positive BS Central nervous system: CN2-12 grossly intact, strength intact Extremities: Perfused, no clubbing Skin: Normal skin turgor, no notable skin lesions seen Psychiatry: Difficult to assess given mentation   Data Reviewed:  Labs reviewed: Na 139, Cr 0.88,  WBC 13.5  Family Communication: Pt in room, family not at bedside  Disposition: Status is: Observation The patient will require care spanning > 2 midnights and should be moved to inpatient because: Severity of illness  Planned Discharge Destination: Home    Author: Marylu Lund, MD 03/18/2022 2:13 PM  For on call review [email or web address].

## 2022-03-18 NOTE — Hospital Course (Signed)
71 y.o. male with medical history significant for hypertension, alcohol abuse, atrial flutter. Patient was brought to the ED from nursing home with reports of confusion. Pt was found to have UTI

## 2022-03-18 NOTE — Progress Notes (Addendum)
Patient states that his abdomen is hurting and is an 8 on a scale of 0-10. Patient states this is the reason he is not eating. Patient was given tylenol for the pain. Sister was wondering if he could get a CT of his abdomen to see what is going on. MD Wyline Copas made aware.

## 2022-03-18 NOTE — Progress Notes (Signed)
  Transition of Care [facility]) Screening Note   Patient Details  Name: TAJE LITTLER Date of Birth: 1951/05/05   Transition of Care [facility]) CM/SW Contact:    Ihor Gully, LCSW Phone Number: 03/18/2022, 9:20 AM    Transition of Care Department [facility]) has reviewed patient and no TOC needs have been identified at this time. We will continue to monitor patient advancement through interdisciplinary progression rounds. If new patient transition needs arise, please place a TOC consult.

## 2022-03-18 NOTE — Progress Notes (Signed)
Sister states that this is not normal for the patient to not answer the questions when spoke to. Patient will answer my questions about 50/50. Patient is also delayed when answering. Nephew states that this is normal for the patient. MD Wyline Copas made aware.

## 2022-03-18 NOTE — Progress Notes (Signed)
Attempted to feed patient breakfast. Patient refused. This LPN did get patient to drink a chocolate ensure.

## 2022-03-19 ENCOUNTER — Inpatient Hospital Stay (HOSPITAL_COMMUNITY): Payer: Medicare Other

## 2022-03-19 DIAGNOSIS — N390 Urinary tract infection, site not specified: Secondary | ICD-10-CM | POA: Diagnosis not present

## 2022-03-19 DIAGNOSIS — A419 Sepsis, unspecified organism: Secondary | ICD-10-CM | POA: Diagnosis not present

## 2022-03-19 DIAGNOSIS — R4182 Altered mental status, unspecified: Secondary | ICD-10-CM | POA: Diagnosis not present

## 2022-03-19 DIAGNOSIS — G9341 Metabolic encephalopathy: Secondary | ICD-10-CM | POA: Diagnosis not present

## 2022-03-19 LAB — CBC
HCT: 36.8 % — ABNORMAL LOW (ref 39.0–52.0)
Hemoglobin: 12.4 g/dL — ABNORMAL LOW (ref 13.0–17.0)
MCH: 30.3 pg (ref 26.0–34.0)
MCHC: 33.7 g/dL (ref 30.0–36.0)
MCV: 90 fL (ref 80.0–100.0)
Platelets: 165 10*3/uL (ref 150–400)
RBC: 4.09 MIL/uL — ABNORMAL LOW (ref 4.22–5.81)
RDW: 14.3 % (ref 11.5–15.5)
WBC: 10.9 10*3/uL — ABNORMAL HIGH (ref 4.0–10.5)
nRBC: 0 % (ref 0.0–0.2)

## 2022-03-19 LAB — COMPREHENSIVE METABOLIC PANEL
ALT: 29 U/L (ref 0–44)
AST: 17 U/L (ref 15–41)
Albumin: 3.1 g/dL — ABNORMAL LOW (ref 3.5–5.0)
Alkaline Phosphatase: 48 U/L (ref 38–126)
Anion gap: 6 (ref 5–15)
BUN: 18 mg/dL (ref 8–23)
CO2: 23 mmol/L (ref 22–32)
Calcium: 8.6 mg/dL — ABNORMAL LOW (ref 8.9–10.3)
Chloride: 111 mmol/L (ref 98–111)
Creatinine, Ser: 0.91 mg/dL (ref 0.61–1.24)
GFR, Estimated: >60 mL/min (ref >60)
Glucose, Bld: 92 mg/dL (ref 70–99)
Potassium: 3.7 mmol/L (ref 3.5–5.1)
Sodium: 140 mmol/L (ref 135–145)
Total Bilirubin: 0.6 mg/dL (ref 0.3–1.2)
Total Protein: 6 g/dL — ABNORMAL LOW (ref 6.5–8.1)

## 2022-03-19 LAB — URINE CULTURE: Culture: 50000 — AB

## 2022-03-19 LAB — MAGNESIUM: Magnesium: 2.2 mg/dL (ref 1.7–2.4)

## 2022-03-19 MED ORDER — SODIUM CHLORIDE 0.9 % IV SOLN: INTRAVENOUS | Status: AC

## 2022-03-19 MED ORDER — POTASSIUM CHLORIDE CRYS ER 20 MEQ PO TBCR
60.0000 meq | EXTENDED_RELEASE_TABLET | Freq: Once | ORAL | Status: AC
  Administered 2022-03-19: 60 meq via ORAL
  Filled 2022-03-19: qty 3

## 2022-03-19 MED ORDER — CEFAZOLIN SODIUM-DEXTROSE 1-4 GM/50ML-% IV SOLN
1.0000 g | Freq: Three times a day (TID) | INTRAVENOUS | Status: DC
  Administered 2022-03-19 – 2022-03-20 (×3): 1 g via INTRAVENOUS
  Filled 2022-03-19 (×8): qty 50

## 2022-03-19 MED ORDER — IOHEXOL 300 MG/ML  SOLN
100.0000 mL | Freq: Once | INTRAMUSCULAR | Status: AC | PRN
  Administered 2022-03-19: 100 mL via INTRAVENOUS

## 2022-03-19 NOTE — Evaluation (Signed)
Occupational Therapy Evaluation Patient Details Name: Marc Jimenez MRN: [id number] DOB: December 27, 1950 Today's Date: 03/19/2022   History of Present Illness Marc Jimenez is a 71 y.o. male with medical history significant for hypertension, alcohol abuse, atrial flutter.  Patient was brought to the ED from nursing home with reports of confusion.  Patient's sister reported that this has been ongoing and worsening over the past 3 to 4 weeks.  ED provider called nursing home Monday reported that patient was on Keflex for UTI from 6/23 to 6/28.  At the time of my evaluation patient is awake and alert, he is able to tell me his name otherwise and that he needs to pee, he confirms that it hurts when he pees (per MD)   Clinical Impression   Pt agreeable to OT evaluation. Pt selectively communicating. Unsure of pt's baseline cognitive status. Pt did not answer orientation questions. Assist needed for bed mobility. Pt generally weak with labored movement. Pt was able to stand and transfer to chair with min to mod A via time and assist to manage RW. Pt generally limited by weakness for ADL's as seen by partial completion of donning sock after doffing with pt having difficulty reaching R foot. Pt left in chair with call bell within reach, chair alarm on, and nursing notified. Pt will benefit from continued OT in the hospital and recommended venue below to increase strength, balance, and endurance for safe ADL's.        Recommendations for follow up therapy are one component of a multi-disciplinary discharge planning process, led by the attending physician.  Recommendations may be updated based on patient status, additional functional criteria and insurance authorization.   Follow Up Recommendations  Skilled nursing-short term rehab (<3 hours/day)    Assistance Recommended at Discharge Frequent or constant Supervision/Assistance  Patient can return home with the following A lot of help with walking and/or  transfers;A lot of help with bathing/dressing/bathroom;Assistance with cooking/housework;Assist for transportation;Direct supervision/assist for medications management    Functional Status Assessment  Patient has had a recent decline in their functional status and demonstrates the ability to make significant improvements in function in a reasonable and predictable amount of time.  Equipment Recommendations  None recommended by OT    Recommendations for Other Services       Precautions / Restrictions Precautions Precautions: Fall Restrictions Weight Bearing Restrictions: No      Mobility Bed Mobility Overal bed mobility: Needs Assistance Bed Mobility: Supine to Sit     Supine to sit: Min assist, Mod assist, HOB elevated     General bed mobility comments: Slow labored movement with HOB raised. Assist to move B LE to edge of bed and pull to sit.    Transfers Overall transfer level: Needs assistance Equipment used: Rolling walker (2 wheels) Transfers: Sit to/from Stand, Bed to chair/wheelchair/BSC Sit to Stand: Min assist     Step pivot transfers: Min assist, Mod assist     General transfer comment: Labored movement with assist to guide RW as pt pivoted to chair.      Balance Overall balance assessment: Needs assistance Sitting-balance support: Bilateral upper extremity supported, Feet supported Sitting balance-Leahy Scale: Fair Sitting balance - Comments: fair to good seated EOB   Standing balance support: Bilateral upper extremity supported, During functional activity, Reliant on assistive device for balance Standing balance-Leahy Scale: Poor Standing balance comment: using RW  ADL either performed or assessed with clinical judgement   ADL Overall ADL's : Needs assistance/impaired Eating/Feeding: Set up;Sitting   Grooming: Supervision/safety;Applying deodorant;Sitting Grooming Details (indicate cue type and reason):  Supervision assist needed to accruately place deodorant in armpit while seated in recliner. Upper Body Bathing: Minimal assistance;Sitting   Lower Body Bathing: Moderate assistance;Maximal assistance;Sitting/lateral leans   Upper Body Dressing : Min guard;Minimal assistance;Sitting   Lower Body Dressing: Moderate assistance;Sitting/lateral leans Lower Body Dressing Details (indicate cue type and reason): Pt able to doff R sock but needing assist to don sock while seated in recliner. Toilet Transfer: Minimal assistance;Moderate assistance;Rolling walker (2 wheels);Stand-pivot Armed forces technical officer Details (indicate cue type and reason): Simulated via EOB to chair transfer. Toileting- Clothing Manipulation and Hygiene: Maximal assistance;Total assistance;Sitting/lateral lean Toileting - Clothing Manipulation Details (indicate cue type and reason): Pt wearing condom catheter.     Functional mobility during ADLs: Moderate assistance;Rolling walker (2 wheels)       Vision Baseline Vision/History:  ("Can't see out of eyes good.") Ability to See in Adequate Light: 1 Impaired Patient Visual Report: Other (comment) (Unsure of pt baseline vision.) Vision Assessment?: Yes Additional Comments: Difficulty tracking in R upper quadrant. L lateral strabismus of R eye noted. Slow tracking in all planes.     Perception     Praxis      Pertinent Vitals/Pain Pain Assessment Pain Assessment: No/denies pain     Hand Dominance Right   Extremity/Trunk Assessment Upper Extremity Assessment Upper Extremity Assessment: Generalized weakness (2+/5 shoulder flexion. WFL P/ROM for abduction. ~75% available range for shoulder flexion P/ROM . Generally weak otherwise.)   Lower Extremity Assessment Lower Extremity Assessment: Defer to PT evaluation   Cervical / Trunk Assessment Cervical / Trunk Assessment: Kyphotic   Communication Communication Communication: Other (comment) (Pt selective in verbal  communication. Sometimes would respond but slowly.)   Cognition Arousal/Alertness: Awake/alert Behavior During Therapy: WFL for tasks assessed/performed Overall Cognitive Status: No family/caregiver present to determine baseline cognitive functioning                                                        Home Living Family/patient expects to be discharged to:: Skilled nursing facility                                        Prior Functioning/Environment Prior Level of Function : Patient poor historian/Family not available (Pt selective in report of prior living.)             Mobility Comments: Pt selecitve in communication today. Pt reported using RW for mobility with assist at SNF. ADLs Comments: Assisted by SNF staff.        OT Problem List: Decreased strength;Decreased range of motion;Decreased activity tolerance;Impaired balance (sitting and/or standing);Decreased coordination;Impaired vision/perception      OT Treatment/Interventions: Self-care/ADL training;Therapeutic exercise;Therapeutic activities;Patient/family education;Balance training;Visual/perceptual remediation/compensation;Cognitive remediation/compensation    OT Goals(Current goals can be found in the care plan section) Acute Rehab OT Goals Patient Stated Goal: Open to return to rehab. OT Goal Formulation: With patient Time For Goal Achievement: 04/02/22 Potential to Achieve Goals: Fair  OT Frequency: Min 2X/week  End of Session Equipment Utilized During Treatment: Rolling walker (2 wheels);Gait belt Nurse Communication: Mobility status  Activity Tolerance: Patient tolerated treatment well Patient left: in chair;with chair alarm set;with call bell/phone within reach  OT Visit Diagnosis: Unsteadiness on feet (R26.81);Other abnormalities of gait and mobility (R26.89);Muscle weakness (generalized) (M62.81)                 Time: 4461-9012 OT Time Calculation (min): 33 min Charges:  OT General Charges $OT Visit: 1 Visit OT Evaluation $OT Eval Low Complexity: 1 Low  Ticia Virgo OT, MOT  Larey Seat 03/19/2022, 3:09 PM

## 2022-03-19 NOTE — Progress Notes (Signed)
Patient heart rate on monitor ranging from 50's-150's, patient sleeping comfortably. EKG obtained, resulted Atrial flutter with AV block. Notified Dr. Josephine Cables of results, no new orders at this time.
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
## 2022-03-19 NOTE — Plan of Care (Signed)
  Problem: Acute Rehab PT Goals(only PT should resolve) Goal: Pt Will Go Supine/Side To Sit Outcome: Progressing Flowsheets (Taken 03/19/2022 1603) Pt will go Supine/Side to Sit: with min guard assist Goal: Patient Will Transfer Sit To/From Stand Outcome: Progressing Flowsheets (Taken 03/19/2022 1603) Patient will transfer sit to/from stand: with min guard assist Goal: Pt Will Transfer Bed To Chair/Chair To Bed Outcome: Progressing Flowsheets (Taken 03/19/2022 1603) Pt will Transfer Bed to Chair/Chair to Bed: min guard assist Goal: Pt Will Ambulate Outcome: Progressing Flowsheets (Taken 03/19/2022 1603) Pt will Ambulate:  100 feet  with rolling walker  with min guard assist

## 2022-03-19 NOTE — Plan of Care (Signed)
  Problem: Acute Rehab OT Goals (only OT should resolve) Goal: Pt. Will Perform Grooming Flowsheets (Taken 03/19/2022 1512) Pt Will Perform Grooming:  with modified independence  sitting Goal: Pt. Will Perform Upper Body Dressing Flowsheets (Taken 03/19/2022 1512) Pt Will Perform Upper Body Dressing:  with modified independence  sitting Goal: Pt. Will Perform Lower Body Dressing Flowsheets (Taken 03/19/2022 1512) Pt Will Perform Lower Body Dressing:  with supervision  sitting/lateral leans Goal: Pt. Will Transfer To Toilet Flowsheets (Taken 03/19/2022 1512) Pt Will Transfer to Toilet:  with modified independence  stand pivot transfer  with supervision Goal: Pt/Caregiver Will Perform Home Exercise Program Flowsheets (Taken 03/19/2022 1512) Pt/caregiver will Perform Home Exercise Program:  Increased ROM  Increased strength  Both right and left upper extremity  With Supervision  Eymi Lipuma OT, MOT

## 2022-03-19 NOTE — Progress Notes (Signed)
  Progress Note   Patient: Marc Jimenez OTR:[id number] DOB: [birth year] DOA: 03/17/2022     1 DOS: the patient was seen and examined on 03/19/2022   Brief hospital course: 71 y.o. male with medical history significant for hypertension, alcohol abuse, atrial flutter. Patient was brought to the ED from nursing home with reports of confusion. Pt was found to have UTI  Assessment and Plan: * Acute metabolic encephalopathy -Reported confused speech at presentation. -Head CT without acute abnormality, shows chronic lacunar infarct, and generalized atrophy.   -Likely etiology UTI, -continued Seroquel  Ecoli UTI (urinary tract infection) with sepsis present on admit - UA suggestive of UTI, urine cultures.  Tmax of 100.1.  Leukocytosis of 14.4.  Respiratory rate of 18-24.  Meeting sepsis criteria.  Lactic acid 1.4 > 1.2.   -Last urine culture 02/2021, grew pansensitive E. coli.   Per nursing home patient was treated with Keflex 6/23 to 6/28 for UTI -Had been on IV ceftriaxone 1 g daily -Urine cx pos for ecoli resistant to ampicillin, unasyn, cipro, bactrim -Transitioned to ancef -WBC trending down  HTN (hypertension) -Continue Norvasc 5 mg daily -BP stable      Subjective: Does reports some lower quadrant abd discomfort. Denies nausea  Physical Exam: Vitals:   03/18/22 1448 03/18/22 1900 03/19/22 0300 03/19/22 1337  BP: 129/78 118/70 140/83 138/88  Pulse: 68 66 (!) 54 70  Resp: '17 18 18 18  '$ Temp: 99.1 F (37.3 C) 98.2 F (36.8 C) (!) 97.2 F (36.2 C) 98 F (36.7 C)  TempSrc:  Axillary Axillary Oral  SpO2: 96% 100% 100% 98%  Weight:      Height:       General exam: Conversant, in no acute distress Respiratory system: normal chest rise, clear, no audible wheezing Cardiovascular system: regular rhythm, s1-s2 Gastrointestinal system: Nondistended, mild grimacing over palpation over bladder, pos BS Central nervous system: No seizures, no tremors Extremities: No cyanosis, no  joint deformities Skin: No rashes, no pallor Psychiatry: Affect normal // no auditory hallucinations   Data Reviewed:  Labs reviewed: Na 140, Cr 0.91, WBC 10.9  Family Communication: Pt in room, family at bedside  Disposition: Status is: Inpatient Continue inpatient stay because: Severity of illness  Planned Discharge Destination: Home    Author: Marylu Lund, MD 03/19/2022 4:26 PM  For on call review [email or web address].

## 2022-03-19 NOTE — Progress Notes (Signed)
Patient had 14 beat run of v-tach on telemetry monitor. Patient asymptomatic and resting, denies discomfort at this time. Dr. Josephine Cables notified and ordered to continue monitoring.

## 2022-03-20 ENCOUNTER — Inpatient Hospital Stay (HOSPITAL_COMMUNITY): Payer: Medicare Other

## 2022-03-20 DIAGNOSIS — A419 Sepsis, unspecified organism: Secondary | ICD-10-CM | POA: Diagnosis not present

## 2022-03-20 DIAGNOSIS — R4182 Altered mental status, unspecified: Secondary | ICD-10-CM | POA: Diagnosis not present

## 2022-03-20 DIAGNOSIS — N390 Urinary tract infection, site not specified: Secondary | ICD-10-CM | POA: Diagnosis not present

## 2022-03-20 DIAGNOSIS — G9341 Metabolic encephalopathy: Secondary | ICD-10-CM | POA: Diagnosis not present

## 2022-03-20 LAB — CBC
HCT: 37.2 % — ABNORMAL LOW (ref 39.0–52.0)
Hemoglobin: 12.3 g/dL — ABNORMAL LOW (ref 13.0–17.0)
MCH: 29.9 pg (ref 26.0–34.0)
MCHC: 33.1 g/dL (ref 30.0–36.0)
MCV: 90.5 fL (ref 80.0–100.0)
Platelets: 183 10*3/uL (ref 150–400)
RBC: 4.11 MIL/uL — ABNORMAL LOW (ref 4.22–5.81)
RDW: 14.2 % (ref 11.5–15.5)
WBC: 6.9 10*3/uL (ref 4.0–10.5)
nRBC: 0 % (ref 0.0–0.2)

## 2022-03-20 LAB — COMPREHENSIVE METABOLIC PANEL
ALT: 28 U/L (ref 0–44)
AST: 17 U/L (ref 15–41)
Albumin: 3.1 g/dL — ABNORMAL LOW (ref 3.5–5.0)
Alkaline Phosphatase: 48 U/L (ref 38–126)
Anion gap: 5 (ref 5–15)
BUN: 15 mg/dL (ref 8–23)
CO2: 23 mmol/L (ref 22–32)
Calcium: 8.3 mg/dL — ABNORMAL LOW (ref 8.9–10.3)
Chloride: 109 mmol/L (ref 98–111)
Creatinine, Ser: 1 mg/dL (ref 0.61–1.24)
GFR, Estimated: >60 mL/min (ref >60)
Glucose, Bld: 86 mg/dL (ref 70–99)
Potassium: 4.1 mmol/L (ref 3.5–5.1)
Sodium: 137 mmol/L (ref 135–145)
Total Bilirubin: 0.5 mg/dL (ref 0.3–1.2)
Total Protein: 6 g/dL — ABNORMAL LOW (ref 6.5–8.1)

## 2022-03-20 MED ORDER — CEPHALEXIN 500 MG PO CAPS
500.0000 mg | ORAL_CAPSULE | Freq: Two times a day (BID) | ORAL | 0 refills | Status: DC
Start: 2022-03-20 — End: 2022-03-20

## 2022-03-20 MED ORDER — CEPHALEXIN 500 MG PO CAPS
500.0000 mg | ORAL_CAPSULE | Freq: Two times a day (BID) | ORAL | Status: DC
  Administered 2022-03-20 – 2022-03-21 (×3): 500 mg via ORAL
  Filled 2022-03-20 (×7): qty 1

## 2022-03-20 MED ORDER — CEPHALEXIN 500 MG PO CAPS: 500.0000 mg | ORAL_CAPSULE | Freq: Two times a day (BID) | ORAL | 0 refills | Status: AC

## 2022-03-20 MED ORDER — APIXABAN 5 MG PO TABS
5.0000 mg | ORAL_TABLET | Freq: Two times a day (BID) | ORAL | Status: DC
  Administered 2022-03-20 – 2022-03-21 (×3): 5 mg via ORAL
  Filled 2022-03-20 (×4): qty 1

## 2022-03-20 NOTE — Progress Notes (Signed)
  Progress Note   Patient: Marc Jimenez SLH:[id number] DOB: [birth year] DOA: 03/17/2022     2 DOS: the patient was seen and examined on 03/20/2022   Brief hospital course: 71 y.o. male with medical history significant for hypertension, alcohol abuse, atrial flutter. Patient was brought to the ED from nursing home with reports of confusion. Pt was found to have UTI  Assessment and Plan: * Acute metabolic encephalopathy -Reported confused speech at presentation -Head CT without acute abnormality, shows chronic lacunar infarct, and generalized atrophy.   -Likely etiology UTI, -continued Seroquel -Mentation improving  Ecoli UTI (urinary tract infection) with sepsis present on admit - UA suggestive of UTI, urine cultures.  Tmax of 100.1.  Leukocytosis of 14.4.  Respiratory rate of 18-24.  Meeting sepsis criteria.  Lactic acid 1.4 > 1.2.   -Last urine culture 02/2021, grew pansensitive E. coli.   Per nursing home patient was treated with Keflex 6/23 to 6/28 for UTI -Had been on IV ceftriaxone 1 g daily -Urine cx pos for ecoli resistant to ampicillin, unasyn, cipro, bactrim -Transitioned to ancef and now keflex -WBC trended down to normal  HTN (hypertension) -Continue Norvasc 5 mg daily -BP stable  Afib/flutter -Known chronic history of aflutter  -Currently rate controlled -Chart reviewed. Pt with CHADS-VASc 2 of 3 -Previously, anticoagulation was not recommended given noncompliance to care. However, pt now resides at facility where he is more compliant with regimen. Discussed with family who agrees that anticoagulation would be more appropriate now as a result. -Have ordered eliquis      Subjective: In good spirits. Without complaints. Family reports pt feeling better  Physical Exam: Vitals:   03/19/22 2101 03/19/22 2152 03/20/22 0431 03/20/22 1300  BP: 122/81  117/86 136/78  Pulse: 72 65 60 (!) 58  Resp: '18 14 17 18  '$ Temp: 98.7 F (37.1 C)  98.6 F (37 C) 98.4 F (36.9 C)   TempSrc: Oral   Oral  SpO2: 99% 96% 98% 100%  Weight:      Height:       General exam: Awake, laying in bed, in nad Respiratory system: Normal respiratory effort, no wheezing Cardiovascular system: regular rate, s1, s2 Gastrointestinal system: Soft, nondistended, positive BS Central nervous system: CN2-12 grossly intact, strength intact Extremities: Perfused, no clubbing Skin: Normal skin turgor, no notable skin lesions seen Psychiatry: Mood normal // no visual hallucinations   Data Reviewed:  Labs reviewed: Na 137, Cr 1.00, WBC 6.9  Family Communication: Pt in room, family at bedside  Disposition: Status is: Inpatient Continue inpatient stay because: Severity of illness  Planned Discharge Destination: Home    Author: Marylu Lund, MD 03/20/2022 6:22 PM  For on call review [email or web address].

## 2022-03-20 NOTE — TOC Initial Note (Signed)
Transition of Care [facility]) - Initial/Assessment Note    Patient Details  Name: Marc Jimenez MRN: [id number] Date of Birth: 1950-10-08  Transition of [facility]) CM/SW Contact:    Salome Arnt, [place] Phone Number: 03/20/2022, 9:54 AM  Clinical Narrative:  Pt admitted due to acute metabolic encephalopathy and UTI. Pt is resident at [facility]. LCSW spoke with pt's sister, Daphyne who requests return to [facility]. She is aware PT recommends HHPT and OT recommended SNF. LCSW also discussed with Sunday Spillers at [facility] who feels pt is at baseline. All are agreeable to HHPT/OT with [facility]. Referral sent to [facility] with [facility]. Per Sunday Spillers, pt already has cane and rolling walker. Pt may be ready over weekend. Sunday Spillers is agreeable as long as any new medications are sent with pt through weekend. FL2 started. TOC will continue to follow.                 Expected Discharge Plan: Assisted Living Barriers to Discharge: Continued Medical Work up   Patient Goals and CMS Choice Patient states their goals for this hospitalization and ongoing recovery are:: return to ALF   Choice offered to / list presented to : Sibling  Expected Discharge Plan and Services Expected Discharge Plan: Assisted Living In-house Referral: Clinical Social Work   Post Acute Care Choice: Resumption of Svcs/PTA Provider Living arrangements for the past 2 months: Assisted Living Facility                           HH Arranged: PT, OT [facility] Agency: [place] Date [place]: 03/20/22 Time [place]: [phone] Representative spoke with at [place]: Lattie Haw  Prior Living Arrangements/Services Living arrangements for the past 2 months: [place] Lives with:: Facility Resident Patient language and need for interpreter reviewed:: Yes Do you feel safe going back to the place where you live?: Yes      Need for Family Participation in Patient Care: Yes (Comment) Care giver support  system in place?: Yes (comment) Current home services: DME (walker, cane) Criminal Activity/Legal Involvement Pertinent to Current Situation/Hospitalization: No - Comment as needed  Activities of Daily Living Home Assistive Devices/Equipment: Cane (specify quad or straight) ADL Screening (condition at time of admission) Patient's cognitive ability adequate to safely complete daily activities?: No Is the patient deaf or have difficulty hearing?: No Does the patient have difficulty seeing, even when wearing glasses/contacts?: Yes Does the patient have difficulty concentrating, remembering, or making decisions?: Yes Patient able to express need for assistance with ADLs?: No Does the patient have difficulty dressing or bathing?: Yes Independently performs ADLs?: No Does the patient have difficulty walking or climbing stairs?: Yes Weakness of Legs: Both Weakness of Arms/Hands: Both  Permission Sought/Granted   Permission granted to share information with : Yes, Verbal Permission Granted     Permission granted to share info w AGENCY: [facility]  Permission granted to share info w Relationship: ALF     Emotional Assessment   Attitude/Demeanor/Rapport: Unable to Assess Affect (typically observed): Unable to Assess Orientation: : Oriented to Self Alcohol / Substance Use: Not Applicable Psych Involvement: No (comment)  Admission diagnosis:  Acute cystitis with hematuria [N30.01] Altered mental status, unspecified altered mental status type [F16.38] Acute metabolic encephalopathy [G66.59] Sepsis secondary to UTI ([place]) [A41.9, N39.0] Patient Active Problem List   Diagnosis Date Noted   Sepsis secondary to UTI (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

## 2022-03-20 NOTE — NC FL2 (Signed)
[place] LEVEL OF CARE SCREENING TOOL     IDENTIFICATION  Patient Name: Marc Jimenez Birthdate: [birth year] Sex: male Admission Date (Current Location): 03/17/2022  [place] and [place] Number:  [place] [id number] [place] and Address:  [place] [address] [place]      Provider Number: [id number]  Attending Physician Name and Address:  Donne Hazel, MD  Relative Name and Phone Number:       Current Level of Care: Hospital Recommended Level of Care: [place] Prior Approval Number:    Date Approved/Denied:   PASRR Number: [id number]  Discharge Plan: SNF    Current Diagnoses: Patient Active Problem List   Diagnosis Date Noted   Sepsis secondary to UTI ([place]) 57/32/2025   Acute metabolic encephalopathy 42/70/6237   UTI (urinary tract infection) 03/17/2022   Preoperative cardiovascular examination    Typical atrial flutter (HCC)    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 See d/c summary for updates.)  AMBULATORY STATUS COMMUNICATION OF NEEDS Skin   Extensive Assist Verbally Other (Comment) (Redness to penis)                       Personal Care Assistance Level of Assistance  Bathing, Dressing, Feeding Bathing Assistance: Maximum assistance Feeding assistance: Limited assistance Dressing Assistance: Maximum assistance     Functional Limitations Info  Sight, Hearing, Speech Sight Info: Adequate Hearing Info: Adequate Speech Info: Impaired    SPECIAL CARE FACTORS FREQUENCY  PT (By licensed PT), OT (By licensed OT)                    Contractures      Additional Factors Info   Code Status, Allergies, Psychotropic Code Status Info: Full code Allergies Info: No known allergies Psychotropic Info: Seroquel         Current Medications (03/20/2022):  This is the current hospital active medication list Current Facility-Administered Medications  Medication Dose Route Frequency Provider Last Rate Last Admin   0.9 %  sodium chloride infusion   Intravenous Continuous Donne Hazel, MD 75 mL/hr at 03/20/22 0631 Infusion Verify at 03/20/22 0631   acetaminophen (TYLENOL) tablet 650 mg  650 mg Oral Q6H PRN Emokpae, Ejiroghene E, MD   650 mg at 03/18/22 1537   Or   acetaminophen (TYLENOL) suppository 650 mg  650 mg Rectal Q6H PRN Emokpae, Ejiroghene E, MD       amLODipine (NORVASC) tablet 5 mg  5 mg Oral Daily Emokpae, Ejiroghene E, MD   5 mg at 03/20/22 0830   brimonidine (ALPHAGAN) 0.2 % ophthalmic solution 1 drop  1 drop Both Eyes Q12H Pierce, Dwayne A, RPH   1 drop at 03/20/22 0831   And   timolol (TIMOPTIC) 0.5 % ophthalmic solution 1 drop  1 drop Both Eyes Q12H Pierce, Dwayne A, RPH   1 drop at 03/20/22 0830   ceFAZolin (ANCEF) IVPB 1 g/50 mL premix  1 g Intravenous Q8H Donne Hazel, MD   Stopped at 03/20/22 0619   enoxaparin (LOVENOX) injection 40 mg  40 mg Subcutaneous Q24H Emokpae, Leanne Chang, MD  40 mg at 03/19/22 1935   feeding supplement (ENSURE ENLIVE / ENSURE PLUS) liquid 237 mL  237 mL Oral BID BM Emokpae, Ejiroghene E, MD   237 mL at 03/18/22 0912   multivitamin with minerals tablet 1 tablet  1 tablet Oral Daily Donne Hazel, MD   1 tablet at 03/20/22 0830   ondansetron (ZOFRAN) tablet 4 mg  4 mg Oral Q6H PRN Emokpae, Ejiroghene E, MD       Or   ondansetron (ZOFRAN) injection 4 mg  4 mg Intravenous Q6H PRN Emokpae, Ejiroghene E, MD       polyethylene glycol (MIRALAX / GLYCOLAX) packet 17 g  17 g Oral Daily PRN Emokpae, Ejiroghene E, MD       QUEtiapine (SEROQUEL) tablet 12.5 mg  12.5 mg Oral QHS Emokpae, Ejiroghene E, MD   12.5 mg at 03/19/22 2117    tamsulosin (FLOMAX) capsule 0.4 mg  0.4 mg Oral Daily Emokpae, Ejiroghene E, MD   0.4 mg at 03/20/22 0830     Discharge Medications: Please see discharge summary for a list of discharge medications.  Relevant Imaging Results:  Relevant Lab Results:   Additional Information SSN: [id number]  Salome Arnt, LCSW

## 2022-03-20 NOTE — Progress Notes (Signed)
ANTICOAGULATION CONSULT NOTE - Initial Consult  Pharmacy Consult for apixaban dosing Indication: atrial fibrillation  No Known Allergies  Patient Measurements: Height: '5\' 7"'$  (170.2 cm) Weight: 62.6 kg (138 lb 0.1 oz) IBW/kg (Calculated) : 66.1   Vital Signs: Temp: 98.6 F (37 C) (07/14 0431) BP: 117/86 (07/14 0431) Pulse Rate: 60 (07/14 0431)  Labs: Recent Labs    03/18/22 0605 03/19/22 0524 03/20/22 0525  HGB 12.6* 12.4* 12.3*  HCT 37.6* 36.8* 37.2*  PLT 184 165 183  CREATININE 0.88 0.91 1.00    Estimated Creatinine Clearance: 60.9 mL/min (by C-G formula based on SCr of 1 mg/dL). Lab Results  Component Value Date   CREATININE 1.00 03/20/2022   CREATININE 0.91 03/19/2022   CREATININE 0.88 03/18/2022   Filed Weights   03/17/22 1319 03/17/22 1922  Weight: 59 kg (130 lb) 62.6 kg (138 lb 0.1 oz)     Medical History: Past Medical History:  Diagnosis Date   Alcohol abuse    Arrhythmia    Cataplexy    Glaucoma    Heart disease    Hypertension    Narcolepsy    Narcolepsy     Medications:  Medications Prior to Admission  Medication Sig Dispense Refill Last Dose   amLODipine (NORVASC) 5 MG tablet Take 1 tablet by mouth daily.   03/17/2022   COMBIGAN 0.2-0.5 % ophthalmic solution Place 1 drop into both eyes every 12 (twelve) hours.   3/87/5643   folic acid (FOLVITE) 1 MG tablet Take 1 tablet (1 mg total) by mouth daily. 30 tablet 1 03/17/2022   furosemide (LASIX) 20 MG tablet Take 10 mg by mouth daily.   03/17/2022   LUMIGAN 0.01 % SOLN Place 1 drop into both eyes at bedtime.   03/16/2022   QUEtiapine (SEROQUEL) 25 MG tablet Take 12.5 mg by mouth at bedtime.   03/17/2022   rosuvastatin (CRESTOR) 10 MG tablet Take 10 mg by mouth at bedtime.   03/16/2022   tamsulosin (FLOMAX) 0.4 MG CAPS capsule Take 0.4 mg by mouth daily.   03/17/2022   thiamine (VITAMIN B-1) 100 MG tablet Take 100 mg by mouth daily.   03/17/2022   tiZANidine (ZANAFLEX) 2 MG tablet Take 2 mg by mouth  at bedtime.   unknown   Scheduled:   amLODipine  5 mg Oral Daily   apixaban  5 mg Oral BID   brimonidine  1 drop Both Eyes Q12H   And   timolol  1 drop Both Eyes Q12H   cephALEXin  500 mg Oral Q12H   feeding supplement  237 mL Oral BID BM   multivitamin with minerals  1 tablet Oral Daily   QUEtiapine  12.5 mg Oral QHS   tamsulosin  0.4 mg Oral Daily   Infusions:  PRN: acetaminophen **OR** acetaminophen, ondansetron **OR** ondansetron (ZOFRAN) IV, polyethylene glycol Anti-infectives (From admission, onward)    Start     Dose/Rate Route Frequency Ordered Stop   03/20/22 1100  cephALEXin (KEFLEX) capsule 500 mg        500 mg Oral Every 12 hours 03/20/22 1010 03/24/22 0959   03/20/22 0000  cephALEXin (KEFLEX) 500 MG capsule  Status:  Discontinued        500 mg Oral Every 12 hours 03/20/22 1012 03/20/22    03/20/22 0000  cephALEXin (KEFLEX) 500 MG capsule        500 mg Oral Every 12 hours 03/20/22 1018 03/24/22 2359   03/19/22 1400  ceFAZolin (ANCEF) IVPB 1 g/50  mL premix  Status:  Discontinued        1 g 100 mL/hr over 30 Minutes Intravenous Every 8 hours 03/19/22 1212 03/20/22 1010   03/18/22 1600  cefTRIAXone (ROCEPHIN) 1 g in sodium chloride 0.9 % 100 mL IVPB  Status:  Discontinued        1 g 200 mL/hr over 30 Minutes Intravenous Every 24 hours 03/17/22 1901 03/19/22 1212   03/17/22 1645  cefTRIAXone (ROCEPHIN) 1 g in sodium chloride 0.9 % 100 mL IVPB        1 g 200 mL/hr over 30 Minutes Intravenous  Once 03/17/22 1638 03/17/22 1751       Assessment: Marc Jimenez a 71 y.o. male requires anticoagulation with apixaban for the indication of  atrial fibrillation. Apixban will be started following pharmacy protocol per pharmacy consult.   Goal of Therapy:  Patient to be properly anticoagulated with apixaban Monitor platelets by anticoagulation protocol: yes   Plan:   Apixaban '5mg'$  po BID CBC MWF Monitor for signs and symptoms of bleeding   Thomasenia Sales,  PharmD Clinical Pharmacist 03/20/2022,11:45 AM
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
## 2022-03-20 NOTE — Care Management Important Message (Signed)
Important Message  Patient Details  Name: Marc Jimenez MRN: [id number] Date of Birth: 01/22/1951   Medicare Important Message Given:  N/A - LOS <3 / Initial given by admissions     Dannette Barbara 03/20/2022, 2:46 PM

## 2022-03-20 NOTE — Progress Notes (Signed)
Patient took mask of CPAP back off , did not like it.

## 2022-03-20 NOTE — Progress Notes (Signed)
Patient working with PT at [phone] unable to obtain "routine ordered" EKG at this time.

## 2022-03-21 DIAGNOSIS — G9341 Metabolic encephalopathy: Secondary | ICD-10-CM | POA: Diagnosis not present

## 2022-03-21 DIAGNOSIS — R4182 Altered mental status, unspecified: Secondary | ICD-10-CM | POA: Diagnosis not present

## 2022-03-21 LAB — CBC
HCT: 39.2 % (ref 39.0–52.0)
Hemoglobin: 13.2 g/dL (ref 13.0–17.0)
MCH: 29.9 pg (ref 26.0–34.0)
MCHC: 33.7 g/dL (ref 30.0–36.0)
MCV: 88.7 fL (ref 80.0–100.0)
Platelets: 214 10*3/uL (ref 150–400)
RBC: 4.42 MIL/uL (ref 4.22–5.81)
RDW: 13.8 % (ref 11.5–15.5)
WBC: 5.8 10*3/uL (ref 4.0–10.5)
nRBC: 0 % (ref 0.0–0.2)

## 2022-03-21 LAB — COMPREHENSIVE METABOLIC PANEL
ALT: 29 U/L (ref 0–44)
AST: 19 U/L (ref 15–41)
Albumin: 3.3 g/dL — ABNORMAL LOW (ref 3.5–5.0)
Alkaline Phosphatase: 57 U/L (ref 38–126)
Anion gap: 6 (ref 5–15)
BUN: 12 mg/dL (ref 8–23)
CO2: 23 mmol/L (ref 22–32)
Calcium: 8.8 mg/dL — ABNORMAL LOW (ref 8.9–10.3)
Chloride: 108 mmol/L (ref 98–111)
Creatinine, Ser: 0.94 mg/dL (ref 0.61–1.24)
GFR, Estimated: >60 mL/min (ref >60)
Glucose, Bld: 95 mg/dL (ref 70–99)
Potassium: 3.9 mmol/L (ref 3.5–5.1)
Sodium: 137 mmol/L (ref 135–145)
Total Bilirubin: 0.6 mg/dL (ref 0.3–1.2)
Total Protein: 6.4 g/dL — ABNORMAL LOW (ref 6.5–8.1)

## 2022-03-21 MED ORDER — APIXABAN 5 MG PO TABS
5.0000 mg | ORAL_TABLET | Freq: Two times a day (BID) | ORAL | 0 refills | Status: AC
Start: 2022-03-21 — End: 2024-02-24

## 2022-03-21 MED ORDER — LABETALOL HCL 5 MG/ML IV SOLN
5.0000 mg | INTRAVENOUS | Status: DC | PRN
  Filled 2022-03-21: qty 4

## 2022-03-21 NOTE — Plan of Care (Signed)

## 2022-03-21 NOTE — Discharge Summary (Signed)
Physician Discharge Summary   Patient: Marc Jimenez MRN: [id number] DOB: 01-Sep-1951  Admit date:     03/17/2022  Discharge date: 03/21/22  Discharge Physician: Marylu Lund   PCP: [name], MD   Recommendations at discharge:    Follow up with PCP in 1-2 weeks Recommend follow up with Cardiology Recommend referral for outpatient sleep study  Discharge Diagnoses: Principal Problem:   Acute metabolic encephalopathy Active Problems:   HTN (hypertension)   UTI (urinary tract infection)   Sepsis secondary to UTI [facility])  Resolved Problems:   * No resolved hospital problems. *  Hospital Course: 71 y.o. male with medical history significant for hypertension, alcohol abuse, atrial flutter. Patient was brought to the ED from nursing home with reports of confusion. Pt was found to have UTI  Assessment and Plan: * Acute metabolic encephalopathy -Reported confused speech at presentation -Head CT without acute abnormality, shows chronic lacunar infarct, and generalized atrophy.   -Likely etiology UTI, -continued Seroquel -Mentation improving   Ecoli UTI (urinary tract infection) with sepsis present on admit - UA suggestive of UTI, urine cultures.  Tmax of 100.1.  Leukocytosis of 14.4.  Respiratory rate of 18-24.  Meeting sepsis criteria.  Lactic acid 1.4 > 1.2.   -Last urine culture 02/2021, grew pansensitive E. coli.   Per nursing home patient was treated with Keflex 6/23 to 6/28 for UTI -Had initially been on IV ceftriaxone 1 g daily -Urine cx pos for ecoli resistant to ampicillin, unasyn, cipro, bactrim -Transitioned to ancef and now keflex -WBC trended down to normal   HTN (hypertension) -Continue Norvasc 5 mg daily -BP stable   Afib/flutter -Known chronic history of aflutter  -Currently rate controlled -Chart reviewed. Pt with CHADS-VASc 2 of 3 -Previously, anticoagulation was not recommended given noncompliance to care. However, pt now resides at facility where he  is more compliant with regimen. Discussed with family who agrees that anticoagulation would be more appropriate now as a result. -Started eliquis, tolerating        Consultants:  Procedures performed:   Disposition: Assisted living Diet recommendation:  Regular diet DISCHARGE MEDICATION: Allergies as of 03/21/2022   No Known Allergies      Medication List     STOP taking these medications    tiZANidine 2 MG tablet Commonly known as: ZANAFLEX       TAKE these medications    amLODipine 5 MG tablet Commonly known as: NORVASC Take 1 tablet by mouth daily.   apixaban 5 MG Tabs tablet Commonly known as: ELIQUIS Take 1 tablet (5 mg total) by mouth 2 (two) times daily.   cephALEXin 500 MG capsule Commonly known as: KEFLEX Take 1 capsule (500 mg total) by mouth every 12 (twelve) hours for 4 days.   Combigan 0.2-0.5 % ophthalmic solution Generic drug: brimonidine-timolol Place 1 drop into both eyes every 12 (twelve) hours.   folic acid 1 MG tablet Commonly known as: FOLVITE Take 1 tablet (1 mg total) by mouth daily.   furosemide 20 MG tablet Commonly known as: LASIX Take 10 mg by mouth daily.   Lumigan 0.01 % Soln Generic drug: bimatoprost Place 1 drop into both eyes at bedtime.   QUEtiapine 25 MG tablet Commonly known as: SEROQUEL Take 12.5 mg by mouth at bedtime.   rosuvastatin 10 MG tablet Commonly known as: CRESTOR Take 10 mg by mouth at bedtime.   tamsulosin 0.4 MG Caps capsule Commonly known as: FLOMAX Take 0.4 mg by mouth daily.  thiamine 100 MG tablet Commonly known as: VITAMIN B-1 Take 100 mg by mouth daily.               Durable Medical Equipment  (From admission, onward)           Start     Ordered   03/19/22 1606  For home use only DME Walker rolling  Once       Question Answer Comment  Walker: With 5 Inch Wheels   Patient needs a walker to treat with the following condition Weakness generalized   Patient needs a walker  to treat with the following condition Abnormality of gait and mobility      03/19/22 1606            Contact information for after-discharge care     Destination     [facility] .   Service: Assisted Living Contact information: [address] [place] [phone]                    Discharge Exam: Danley Danker Weights   03/17/22 1319 03/17/22 1922  Weight: 59 kg 62.6 kg   General exam: Awake, laying in bed, in nad Respiratory system: Normal respiratory effort, no wheezing Cardiovascular system: regular rate, s1, s2 Gastrointestinal system: Soft, nondistended, positive BS Central nervous system: CN2-12 grossly intact, strength intact Extremities: Perfused, no clubbing Skin: Normal skin turgor, no notable skin lesions seen Psychiatry: Mood normal // no visual hallucinations   Condition at discharge: fair  The results of significant diagnostics from this hospitalization (including imaging, microbiology, ancillary and laboratory) are listed below for reference.   Imaging Studies: US RENAL  Result Date: 03/20/2022 CLINICAL DATA:  Right renal lesion follow-up. EXAM: RENAL / URINARY TRACT ULTRASOUND COMPLETE COMPARISON:  CT abdomen pelvis from yesterday. FINDINGS: Right Kidney: Renal measurements: 11.1 x 4.5 x 5.8 cm = volume: 152 mL. Echogenicity within normal limits. No mass or hydronephrosis visualized. 3.9 cm simple cyst in the lower pole. No follow-up imaging is recommended. 1.6 cm homogeneously hypoechoic lesion with posterior acoustic enhancement and no internal vascularity in the upper pole, consistent with complex cyst. Left Kidney: Renal measurements: 9.8 x 6.5 x 6.0 cm = volume: 198 mL. Echogenicity within normal limits. No mass or hydronephrosis visualized. Bladder: Appears normal for degree of bladder distention. Other: None. IMPRESSION: 1. 1.6 cm right upper pole renal lesion seen on CT corresponds to a  complex cyst. No follow-up imaging is recommended. Electronically Signed   By: Titus Dubin M.D.   On: 03/20/2022 10:10   CT ABDOMEN PELVIS W CONTRAST  Result Date: 03/19/2022 CLINICAL DATA:  Abdominal pain, decreased oral intake EXAM: CT ABDOMEN AND PELVIS WITH CONTRAST TECHNIQUE: Multidetector CT imaging of the abdomen and pelvis was performed using the standard protocol following bolus administration of intravenous contrast. RADIATION DOSE REDUCTION: This exam was performed according to the departmental dose-optimization program which includes automated exposure control, adjustment of the mA and/or kV according to patient size and/or use of iterative reconstruction technique. CONTRAST:  158m OMNIPAQUE IOHEXOL 300 MG/ML  SOLN COMPARISON:  05/30/2021 FINDINGS: Lower chest: There are small linear densities in the lower lung fields suggesting scarring or subsegmental atelectasis. Heart is enlarged in size. Coronary artery calcifications are seen. Small pericardial effusion is present. Hepatobiliary: There is fatty infiltration in the liver. There is no dilation of bile ducts. Gallbladder is unremarkable. Pancreas: There is prominence of pancreatic duct. No focal abnormalities are  seen. Spleen: Unremarkable. Adrenals/Urinary Tract: Right adrenal is unremarkable. There is 1.8 cm nodule in left adrenal which has not changed, possibly suggesting adenoma. There is no hydronephrosis. There are no renal or ureteral stones. There are a few smooth marginated low-density lesions in both kidneys largest measuring 4.2 cm in size in the right kidney suggesting renal cysts. In image 33 of series 2, there is 1.6 cm smoothly marginated lesion with density measurements higher than usual simple cyst suggesting possible hemorrhagic cyst or solid nodule. Urinary bladder is not distended. Stomach/Bowel: Stomach is unremarkable. Small bowel loops are not dilated. Appendix is not dilated. There is no significant wall thickening in  colon. There is no pericolic stranding. Surgical staples are seen in sigmoid colon. Moderate amount of stool is seen in rectum. Vascular/Lymphatic: Atherosclerotic plaques and calcifications are seen in aorta and its major branches. Reproductive: There is inhomogeneous enhancement in enlarged prostate. Other: There is no ascites or pneumoperitoneum. Linear densities in subcutaneous plane in the periumbilical region has not changed. There is diastasis between the rectus muscles at the level of umbilicus. Musculoskeletal: Degenerative changes are noted in lumbar spine, particularly severe at L4-L5 and L5-S1 levels with encroachment of neural foramina. IMPRESSION: There is no evidence of intestinal obstruction or pneumoperitoneum. Appendix is not dilated. There is no hydronephrosis. 1.8 cm left adrenal nodule is stable, possibly adenoma. There are multiple bilateral renal cysts. There is 1.6 cm smoothly marginated lesion in the posterior upper pole of right kidney with density measurements higher than usual for simple cysts. This may suggest a hemorrhagic cyst or solid lesion. Follow-up renal sonogram may be considered. Enlarged prostate with inhomogeneous patchy enhancement. Coronary artery calcifications are seen. Other findings as described in the body of the report. Electronically Signed   By: Elmer Picker M.D.   On: 03/19/2022 17:48   CT Head Wo Contrast  Result Date: 03/17/2022 CLINICAL DATA:  Mental status.  Recent urinary tract infection. EXAM: CT HEAD WITHOUT CONTRAST TECHNIQUE: Contiguous axial images were obtained from the base of the skull through the vertex without intravenous contrast. RADIATION DOSE REDUCTION: This exam was performed according to the departmental dose-optimization program which includes automated exposure control, adjustment of the mA and/or kV according to patient size and/or use of iterative reconstruction technique. COMPARISON:  CT head without contrast 12/31/2017 at [facility]  [place]. FINDINGS: Brain: Moderate generalized atrophy and white matter hypoattenuation is present bilaterally. A remote lacunar infarct involves the anterior limb of the left internal capsule and caudate head. Basal ganglia are otherwise within normal limits. No acute infarct, hemorrhage, or mass lesion is present. The ventricles are proportionate to the degree of atrophy. No significant extraaxial fluid collection is present. Vascular: A vascular calcifications are present within the cavernous internal carotid arteries. No hyperdense vessel is present. Skull: Calvarium is intact. No focal lytic or blastic lesions are present. No significant extracranial soft tissue lesion is present. Sinuses/Orbits: The paranasal sinuses and mastoid air cells are clear. The globes and orbits are within normal limits. IMPRESSION: 1. No acute intracranial abnormality. 2. Moderate generalized atrophy and white matter disease likely reflects the sequela of chronic microvascular ischemia. 3. 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 Electronically Signed   By: San Morelle M.D.   On: 03/17/2022 14:47    Microbiology: Results for orders placed or performed during the hospital encounter of 03/17/22  Urine Culture     Status: Abnormal   Collection Time: 03/17/22  1:36 PM   Specimen: Urine, Clean Catch  Result Value Ref Range Status   Specimen Description   Final    URINE, CLEAN CATCH Performed at [facility], [address], [place], [place] [address]    Special Requests   Final    NONE Performed at [facility], [address], [place], [place] [address]    Culture 50,000 COLONIES/mL ESCHERICHIA COLI (A)  Final   Report Status 03/19/2022 FINAL  Final   Organism ID, Bacteria ESCHERICHIA COLI (A)  Final      Susceptibility   Escherichia coli - MIC*    AMPICILLIN >=32 RESISTANT Resistant     CEFAZOLIN 8 SENSITIVE Sensitive     CEFEPIME <=0.12 SENSITIVE  Sensitive     CEFTRIAXONE <=0.25 SENSITIVE Sensitive     CIPROFLOXACIN >=4 RESISTANT Resistant     GENTAMICIN <=1 SENSITIVE Sensitive     IMIPENEM <=0.25 SENSITIVE Sensitive     NITROFURANTOIN <=16 SENSITIVE Sensitive     TRIMETH/SULFA >=320 RESISTANT Resistant     AMPICILLIN/SULBACTAM >=32 RESISTANT Resistant     PIP/TAZO <=4 SENSITIVE Sensitive     * 50,000 COLONIES/mL ESCHERICHIA COLI  Culture, blood (routine x 2)     Status: None (Preliminary result)   Collection Time: 03/17/22  2:14 PM   Specimen: BLOOD LEFT ARM  Result Value Ref Range Status   Specimen Description BLOOD LEFT ARM  Final   Special Requests   Final    BOTTLES DRAWN AEROBIC AND ANAEROBIC Blood Culture results may not be optimal due to an excessive volume of blood received in culture bottles   Culture   Final    NO GROWTH 4 DAYS Performed at [facility], [address] [place], [place] [address]    Report Status PENDING  Incomplete  Culture, blood (routine x 2)     Status: None (Preliminary result)   Collection Time: 03/17/22  2:23 PM   Specimen: Right Antecubital; Blood  Result Value Ref Range Status   Specimen Description RIGHT ANTECUBITAL  Final   Special Requests   Final    BOTTLES DRAWN AEROBIC AND ANAEROBIC Blood Culture results may not be optimal due to an excessive volume of blood received in culture bottles   Culture   Final    NO GROWTH 4 DAYS Performed at [facility], [address], [place], [place] [address]    Report Status PENDING  Incomplete    Labs: CBC: Recent Labs  Lab 03/17/22 1413 03/18/22 0605 03/19/22 0524 03/20/22 0525 03/21/22 0511  WBC 14.4* 13.5* 10.9* 6.9 5.8  NEUTROABS 11.8*  --   --   --   --   HGB 14.4 12.6* 12.4* 12.3* 13.2  HCT 43.8 37.6* 36.8* 37.2* 39.2  MCV 90.9 88.7 90.0 90.5 88.7  PLT 193 184 165 183 476   Basic Metabolic Panel: Recent Labs  Lab 03/17/22 1413 03/18/22 0605 03/19/22 0524 03/19/22 0545 03/20/22 0525 03/21/22 0511  NA 140 139 140  --   137 137  K 4.0 3.6 3.7  --  4.1 3.9  CL 108 109 111  --  109 108  CO2 '23 22 23  '$ --  23 23  GLUCOSE 114* 108* 92  --  86 95  BUN '16 14 18  '$ --  15 12  CREATININE 1.06 0.88 0.91  --  1.00 0.94  CALCIUM 9.7 8.9 8.6*  --  8.3* 8.8*  MG  --   --   --  2.2  --   --  Liver Function Tests: Recent Labs  Lab 03/19/22 0524 03/20/22 0525 03/21/22 0511  AST '17 17 19  '$ ALT '29 28 29  '$ ALKPHOS 48 48 57  BILITOT 0.6 0.5 0.6  PROT 6.0* 6.0* 6.4*  ALBUMIN 3.1* 3.1* 3.3*   CBG: Recent Labs  Lab 03/18/22 1545  GLUCAP 122*    Discharge time spent: less than 30 minutes.  Signed: Marylu Lund, MD [facility] 03/21/2022

## 2022-03-21 NOTE — TOC Transition Note (Signed)
Transition of Care [facility]) - CM/SW Discharge Note   Patient Details  Name: QUANG THORPE MRN: [id number] Date of Birth: 1951-07-01  Transition of Care [facility]) CM/SW Contact:  Illene Regulus, LCSW Phone Number: 03/21/2022, 1:37 PM   Clinical Narrative:    CSW spoke with Tammy from [facility], they are requesting a new FL2 stating the assisted living level of care, faxed and hard copy with pt, pt's prescriptions E-scribed with hard copies with pt, and any new medications to be with pt to get him through the weekend as they will not have access to their pharmacy until Monday. The facility will proved pt transportation for pt to return. TOC sign off.      Barriers to Discharge: Continued Medical Work up   Patient Goals and CMS Choice Patient states their goals for this hospitalization and ongoing recovery are:: return to ALF   Choice offered to / list presented to : Sibling  Discharge Placement                       Discharge Plan and Services In-house Referral: Clinical Social Work   Post Acute Care Choice: Resumption of Svcs/PTA Provider                    HH Arranged: PT, OT HH Agency: [place] Date [place]: 03/20/22 Time [place]: [phone] Representative spoke with at [place]: [place] Determinants of Health ([place]) Interventions     Readmission Risk Interventions     No data to display

## 2022-03-21 NOTE — NC FL2 (Signed)
[place] LEVEL OF CARE SCREENING TOOL     IDENTIFICATION  Patient Name: Marc Jimenez Birthdate: Jun 26, 1951 Sex: male Admission Date (Current Location): 03/17/2022  [place] and [place] Number:  [place] [id number] [place] and Address:  [place] [address] [place]      Provider Number: [id number]  Attending Physician Name and Address:  Donne Hazel, MD  Relative Name and Phone Number:       Current Level of Care: Hospital Recommended Level of Care: [place] Prior Approval Number:    Date Approved/Denied:   PASRR Number: [id number]  Discharge Plan: Other (Comment) (ALF)    Current Diagnoses: Patient Active Problem List   Diagnosis Date Noted   Sepsis secondary to UTI ([place]) 53/74/8270   Acute metabolic encephalopathy 78/67/5449   UTI (urinary tract infection) 03/17/2022   Preoperative cardiovascular examination    Typical atrial flutter (H[facility])    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   Alcohol abuse 02/10/2021   HTN (hypertension) 02/10/2021    Orientation RESPIRATION BLADDER Height & Weight     Self  Normal External catheter Weight: 138 lb 0.1 oz (62.6 kg) Height:  '5\' 7"'$  (170.2 cm)  BEHAVIORAL SYMPTOMS/MOOD NEUROLOGICAL BOWEL NUTRITION STATUS      Incontinent Diet (regular)  AMBULATORY STATUS COMMUNICATION OF NEEDS Skin   Extensive Assist Verbally Normal                       Personal Care Assistance Level of Assistance  Bathing, Dressing, Feeding Bathing Assistance: Limited assistance Feeding assistance: Limited assistance Dressing Assistance: Limited assistance     Functional Limitations Info  Sight, Hearing, Speech Sight Info: Adequate Hearing Info: Adequate Speech Info: Impaired    SPECIAL CARE FACTORS FREQUENCY  PT (By licensed PT), OT (By licensed OT)     PT Frequency: home health OT Frequency: home health            Contractures      Additional Factors Info   Code Status, Allergies, Psychotropic Code Status Info: full Allergies Info: no known  allergies Psychotropic Info: Seroquel         Current Medications (03/21/2022):  This is the current hospital active medication list Current Facility-Administered Medications  Medication Dose Route Frequency Provider Last Rate Last Admin   acetaminophen (TYLENOL) tablet 650 mg  650 mg Oral Q6H PRN Emokpae, Ejiroghene E, MD   650 mg at 03/18/22 1537   Or   acetaminophen (TYLENOL) suppository 650 mg  650 mg Rectal Q6H PRN Emokpae, Ejiroghene E, MD       amLODipine (NORVASC) tablet 5 mg  5 mg Oral Daily Emokpae, Ejiroghene E, MD   5 mg at 03/21/22 0943   apixaban (ELIQUIS) tablet 5 mg  5 mg Oral BID Coffee, Garrett, RPH   5 mg at 03/21/22 0942   brimonidine (ALPHAGAN) 0.2 % ophthalmic solution 1 drop  1 drop Both Eyes Q12H Pierce, Dwayne A, RPH   1 drop at 03/21/22 0943   And   timolol (TIMOPTIC) 0.5 % ophthalmic solution 1 drop  1 drop Both Eyes Q12H Pierce, Dwayne A, RPH   1 drop at 03/21/22 0943   cephALEXin (KEFLEX) capsule 500 mg  500 mg Oral Q12H Donne Hazel, MD   500 mg at 03/21/22 0942   feeding supplement (ENSURE ENLIVE / ENSURE PLUS) liquid 237 mL  237 mL Oral BID BM Emokpae, Ejiroghene E, MD   237 mL at  03/21/22 0943   labetalol (NORMODYNE) injection 5 mg  5 mg Intravenous Q2H PRN Donne Hazel, MD       multivitamin with minerals tablet 1 tablet  1 tablet Oral Daily Donne Hazel, MD   1 tablet at 03/21/22 0942   ondansetron (ZOFRAN) tablet 4 mg  4 mg Oral Q6H PRN Emokpae, Ejiroghene E, MD       Or   ondansetron (ZOFRAN) injection 4 mg  4 mg Intravenous Q6H PRN Emokpae, Ejiroghene E, MD       polyethylene glycol (MIRALAX / GLYCOLAX) packet 17 g  17 g Oral Daily PRN Emokpae, Ejiroghene E, MD       QUEtiapine (SEROQUEL) tablet 12.5 mg  12.5 mg Oral QHS Emokpae, Ejiroghene E, MD   12.5 mg at 03/20/22 2031   tamsulosin (FLOMAX) capsule 0.4 mg  0.4 mg Oral Daily Emokpae, Ejiroghene E, MD    0.4 mg at 03/21/22 1025     Discharge Medications: Please see discharge summary for a list of discharge medications.  Relevant Imaging Results:  Relevant Lab Results:   Additional Information SSN- [id number]  Arlie Solomons Treyshaun Keatts, LCSW

## 2022-03-22 LAB — CULTURE, BLOOD (ROUTINE X 2)
Culture: NO GROWTH
Culture: NO GROWTH

## 2022-03-27 NOTE — Progress Notes (Unsigned)
Cardiology Office Note:    Date:  03/30/2022   ID:  Marc Jimenez, DOB 08-05-1951, MRN [id number]  PCP:  [name], MD   [facility] Providers Cardiologist:  None {  Referring MD: [name], MD    History of Present Illness:    Marc Jimenez is a 71 y.o. male with a hx of HTN, alcohol abuse, tobacco abuse, and Aflutter who was referred by Dr. Sherrie Sport for further evaluation of Aflutter.   Patient with recent hospitalization in 03/2022 for acute metabolic encephalopathy found to have E.Coli UTI. Has known history of Aflutter and was previously determined to be too high risk for [facility] due to noncompliance. Now that he resides at a facility where medications are administered to him, he was deemed to be a better candidate and started on apixaban. He was also referred by Cardiology for further management.   Today, the patient is accompanied by a home health nurse who provides the majority of the history. The patient has been better since being out of the hospital. No chest pain. Has been having dyspnea on exertion that has been slowly progressing. Used to be able to walk with a cane but now needing the wheelchair more which they attribute to both dyspnea on exertion and gait instability. Blood pressure has been well controlled. HR controlled as well. No orthopnea , PND or LE edema. Tolerating apixaban without bleeding issues. No falls.   Past Medical History:  Diagnosis Date   Alcohol abuse    Arrhythmia    Cataplexy    Glaucoma    Heart disease    Hypertension    Narcolepsy    Narcolepsy     Past Surgical History:  Procedure Laterality Date   FLEXIBLE SIGMOIDOSCOPY N/A 02/12/2021   Procedure: FLEXIBLE SIGMOIDOSCOPY;  Surgeon: Harvel Quale, MD;  Location: [facility];  Service: Gastroenterology;  Laterality: N/A;   HERNIA REPAIR     PARTIAL COLECTOMY N/A 02/14/2021   Procedure: PARTIAL COLECTOMY;  Surgeon: Aviva Signs, MD;  Location: [facility];   Service: General;  Laterality: N/A;    Current Medications: Current Meds  Medication Sig   amLODipine (NORVASC) 5 MG tablet Take 1 tablet by mouth daily.   apixaban (ELIQUIS) 5 MG TABS tablet Take 1 tablet (5 mg total) by mouth 2 (two) times daily.   cephALEXin (KEFLEX) 500 MG capsule Take 500 mg by mouth 2 (two) times daily.   COMBIGAN 0.2-0.5 % ophthalmic solution Place 1 drop into both eyes every 12 (twelve) hours.   folic acid (FOLVITE) 1 MG tablet Take 1 tablet (1 mg total) by mouth daily.   furosemide (LASIX) 20 MG tablet Take 10 mg by mouth daily.   LUMIGAN 0.01 % SOLN Place 1 drop into both eyes at bedtime.   QUEtiapine (SEROQUEL) 25 MG tablet Take 12.5 mg by mouth at bedtime.   rosuvastatin (CRESTOR) 10 MG tablet Take 10 mg by mouth at bedtime.   tamsulosin (FLOMAX) 0.4 MG CAPS capsule Take 0.4 mg by mouth daily.   thiamine (VITAMIN B-1) 100 MG tablet Take 100 mg by mouth daily.     Allergies:   Patient has no known allergies.   Social History   Socioeconomic History   Marital status: Legally Separated    Spouse name: Not on file   Number of children: Not on file   Years of education: Not on file   Highest education level: Not on file  Occupational History  Not on file  Tobacco Use   Smoking status: Every Day    Packs/day: 1.00    Years: 30.00    Total pack years: 30.00    Types: Cigarettes   Smokeless tobacco: Never  Vaping Use   Vaping Use: Never used  Substance and Sexual Activity   Alcohol use: Yes    Alcohol/week: 21.0 standard drinks of alcohol    Types: 21 Cans of beer per week    Comment: 12 pack of beer daily.    Drug use: Yes    Types: Marijuana   Sexual activity: Not on file  Other Topics Concern   Not on file  Social History Narrative   Not on file   Social Determinants of Health   Financial Resource Strain: Not on file  Food Insecurity: Not on file  Transportation Needs: Not on file  Physical Activity: Not on file  Stress: Not on file   Social Connections: Not on file     Family History: The patient's Family history is unknown by patient.  ROS:   Please see the history of present illness.     All other systems reviewed and are negative.  EKGs/Labs/Other Studies Reviewed:    The following studies were reviewed today: TTE 2021-02-26: IMPRESSIONS     1. Left ventricular ejection fraction, by estimation, is 60 to 65%. The  left ventricle has normal function. The left ventricle has no regional  wall motion abnormalities. There is moderate left ventricular hypertrophy.  Left ventricular diastolic  parameters are indeterminate.   2. Right ventricular systolic function is normal. The right ventricular  size is normal. There is normal pulmonary artery systolic pressure. The  estimated right ventricular systolic pressure is 85.4 mmHg.   3. Left atrial size was mild to moderately dilated.   4. Right atrial size was mildly dilated.   5. The mitral valve is grossly normal. Mild mitral valve regurgitation.   6. The aortic valve is tricuspid. Aortic valve regurgitation is mild.  Aortic regurgitation PHT measures 719 msec.   7. The inferior vena cava is normal in size with greater than 50%  respiratory variability, suggesting right atrial pressure of 3 mmHg.   EKG:  EKG 03/21/22: Aflutter with variable block  Recent Labs: 03/19/2022: Magnesium 2.2 03/21/2022: ALT 29; BUN 12; Creatinine, Ser 0.94; Hemoglobin 13.2; Platelets 214; Potassium 3.9; Sodium 137  Recent Lipid Panel No results found for: "CHOL", "TRIG", "HDL", "CHOLHDL", "VLDL", "LDLCALC", "LDLDIRECT"   Risk Assessment/Calculations:    CHA2DS2-VASc Score = 3   This indicates a 3.2% annual risk of stroke. The patient's score is based upon: CHF History: 0 HTN History: 1 Diabetes History: 0 Stroke History: 0 Vascular Disease History: 1 Age Score: 1 Gender Score: 0         Physical Exam:    VS:  BP 100/70   Pulse 77   Ht '5\' 7"'$  (1.702 m)   Wt 139 lb 3.2  oz (63.1 kg)   SpO2 97%   BMI 21.80 kg/m     Wt Readings from Last 3 Encounters:  03/30/22 139 lb 3.2 oz (63.1 kg)  03/17/22 138 lb 0.1 oz (62.6 kg)  10/09/21 136 lb (61.7 kg)     GEN: Somnolent but arousable to voice. Sitting in a wheel chair.  HEENT: Normal NECK: No JVD; No carotid bruits CARDIAC: Irregular, no murmurs RESPIRATORY:  Diminished but clear ABDOMEN: Soft, non-tender, non-distended MUSCULOSKELETAL:  No edema; No deformity  SKIN: Warm and dry NEUROLOGIC:  Minimally conversant. Able to follow simple commands. PSYCHIATRIC:  Calm  ASSESSMENT:    1. Typical atrial flutter ([place])   2. Pure hypercholesterolemia   3. Primary hypertension    PLAN:    In order of problems listed above:  #Aflutter: CHADs-vasc 3. TTE with normal BiV function, mild MR/AI. Recently started on apixaban now that his compliance has improved with residing in a facility. Not on nodal agents and HR controlled. No bleeding issues. Having some gait instability but no falls. -Continue apixaban '5mg'$  BID -HR controlled not on nodal agents  #HLD: -Continue crestor '10mg'$  daily -LDL 76  #HTN: -Continue amlodipine '5mg'$  daily -Monitored closely by home health and is well controlled at home  #Mild MR: #Mild AI: -Serial monitoring with TTEs        Medication Adjustments/Labs and Tests Ordered: Current medicines are reviewed at length with the patient today.  Concerns regarding medicines are outlined above.  No orders of the defined types were placed in this encounter.  No orders of the defined types were placed in this encounter.   Patient Instructions  Medication Instructions:   Your physician recommends that you continue on your current medications as directed. Please refer to the Current Medication list given to you today.  *If you need a refill on your cardiac medications before your next appointment, please call your pharmacy*   Follow-Up: At [facility], you and your health  needs are our priority.  As part of our continuing mission to provide you with exceptional heart care, we have created designated Provider Care Teams.  These Care Teams include your primary Cardiologist (physician) and Advanced Practice Providers (APPs -  Physician Assistants and Nurse Practitioners) who all work together to provide you with the care you need, when you need it.  We recommend signing up for the patient portal called "MyChart".  Sign up information is provided on this After Visit Summary.  MyChart is used to connect with patients for Virtual Visits (Telemedicine).  Patients are able to view lab/test results, encounter notes, upcoming appointments, etc.  Non-urgent messages can be sent to your provider as well.   To learn more about what you can do with MyChart, go to [email or web address].    Your next appointment:   6 month(s)  The format for your next appointment:   In Person  Provider:   Dr. Johney Frame   Important Information About Sugar         Signed, Freada Bergeron, MD  03/30/2022 2:05 PM    [place]

## 2022-03-30 ENCOUNTER — Ambulatory Visit (INDEPENDENT_AMBULATORY_CARE_PROVIDER_SITE_OTHER): Payer: Medicare Other | Admitting: Cardiology

## 2022-03-30 ENCOUNTER — Encounter: Payer: Self-pay | Admitting: Cardiology

## 2022-03-30 VITALS — BP 100/70 | HR 77 | Ht 67.0 in | Wt 139.2 lb

## 2022-03-30 DIAGNOSIS — E7800 Pure hypercholesterolemia, unspecified: Secondary | ICD-10-CM | POA: Diagnosis not present

## 2022-03-30 DIAGNOSIS — I10 Essential (primary) hypertension: Secondary | ICD-10-CM | POA: Diagnosis not present

## 2022-03-30 DIAGNOSIS — I483 Typical atrial flutter: Secondary | ICD-10-CM

## 2022-03-30 NOTE — Patient Instructions (Signed)

## 2022-04-01 DIAGNOSIS — N3001 Acute cystitis with hematuria: Secondary | ICD-10-CM | POA: Diagnosis not present

## 2022-04-01 DIAGNOSIS — G9340 Encephalopathy, unspecified: Secondary | ICD-10-CM | POA: Diagnosis not present

## 2022-04-02 DIAGNOSIS — I10 Essential (primary) hypertension: Secondary | ICD-10-CM | POA: Diagnosis not present

## 2022-04-02 DIAGNOSIS — Z792 Long term (current) use of antibiotics: Secondary | ICD-10-CM | POA: Diagnosis not present

## 2022-04-02 DIAGNOSIS — I483 Typical atrial flutter: Secondary | ICD-10-CM | POA: Diagnosis not present

## 2022-04-02 DIAGNOSIS — B9620 Unspecified Escherichia coli [E. coli] as the cause of diseases classified elsewhere: Secondary | ICD-10-CM | POA: Diagnosis not present

## 2022-04-02 DIAGNOSIS — Z8673 Personal history of transient ischemic attack (TIA), and cerebral infarction without residual deficits: Secondary | ICD-10-CM | POA: Diagnosis not present

## 2022-04-02 DIAGNOSIS — Z7901 Long term (current) use of anticoagulants: Secondary | ICD-10-CM | POA: Diagnosis not present

## 2022-04-02 DIAGNOSIS — N390 Urinary tract infection, site not specified: Secondary | ICD-10-CM | POA: Diagnosis not present

## 2022-04-07 DIAGNOSIS — Z792 Long term (current) use of antibiotics: Secondary | ICD-10-CM | POA: Diagnosis not present

## 2022-04-07 DIAGNOSIS — I10 Essential (primary) hypertension: Secondary | ICD-10-CM | POA: Diagnosis not present

## 2022-04-07 DIAGNOSIS — Z7901 Long term (current) use of anticoagulants: Secondary | ICD-10-CM | POA: Diagnosis not present

## 2022-04-07 DIAGNOSIS — I483 Typical atrial flutter: Secondary | ICD-10-CM | POA: Diagnosis not present

## 2022-04-07 DIAGNOSIS — N390 Urinary tract infection, site not specified: Secondary | ICD-10-CM | POA: Diagnosis not present

## 2022-04-07 DIAGNOSIS — M79675 Pain in left toe(s): Secondary | ICD-10-CM | POA: Diagnosis not present

## 2022-04-07 DIAGNOSIS — M79674 Pain in right toe(s): Secondary | ICD-10-CM | POA: Diagnosis not present

## 2022-04-07 DIAGNOSIS — B9620 Unspecified Escherichia coli [E. coli] as the cause of diseases classified elsewhere: Secondary | ICD-10-CM | POA: Diagnosis not present

## 2022-04-07 DIAGNOSIS — B351 Tinea unguium: Secondary | ICD-10-CM | POA: Diagnosis not present

## 2022-04-08 DIAGNOSIS — N390 Urinary tract infection, site not specified: Secondary | ICD-10-CM | POA: Diagnosis not present

## 2022-04-08 DIAGNOSIS — I483 Typical atrial flutter: Secondary | ICD-10-CM | POA: Diagnosis not present

## 2022-04-08 DIAGNOSIS — B9620 Unspecified Escherichia coli [E. coli] as the cause of diseases classified elsewhere: Secondary | ICD-10-CM | POA: Diagnosis not present

## 2022-04-08 DIAGNOSIS — Z7901 Long term (current) use of anticoagulants: Secondary | ICD-10-CM | POA: Diagnosis not present

## 2022-04-08 DIAGNOSIS — I10 Essential (primary) hypertension: Secondary | ICD-10-CM | POA: Diagnosis not present

## 2022-04-08 DIAGNOSIS — Z792 Long term (current) use of antibiotics: Secondary | ICD-10-CM | POA: Diagnosis not present

## 2022-04-09 DIAGNOSIS — Z792 Long term (current) use of antibiotics: Secondary | ICD-10-CM | POA: Diagnosis not present

## 2022-04-09 DIAGNOSIS — I10 Essential (primary) hypertension: Secondary | ICD-10-CM | POA: Diagnosis not present

## 2022-04-09 DIAGNOSIS — I483 Typical atrial flutter: Secondary | ICD-10-CM | POA: Diagnosis not present

## 2022-04-09 DIAGNOSIS — N390 Urinary tract infection, site not specified: Secondary | ICD-10-CM | POA: Diagnosis not present

## 2022-04-09 DIAGNOSIS — B9620 Unspecified Escherichia coli [E. coli] as the cause of diseases classified elsewhere: Secondary | ICD-10-CM | POA: Diagnosis not present

## 2022-04-09 DIAGNOSIS — Z7901 Long term (current) use of anticoagulants: Secondary | ICD-10-CM | POA: Diagnosis not present

## 2022-04-23 DIAGNOSIS — I483 Typical atrial flutter: Secondary | ICD-10-CM | POA: Diagnosis not present

## 2022-04-23 DIAGNOSIS — Z792 Long term (current) use of antibiotics: Secondary | ICD-10-CM | POA: Diagnosis not present

## 2022-04-23 DIAGNOSIS — I10 Essential (primary) hypertension: Secondary | ICD-10-CM | POA: Diagnosis not present

## 2022-04-23 DIAGNOSIS — N390 Urinary tract infection, site not specified: Secondary | ICD-10-CM | POA: Diagnosis not present

## 2022-04-23 DIAGNOSIS — B9620 Unspecified Escherichia coli [E. coli] as the cause of diseases classified elsewhere: Secondary | ICD-10-CM | POA: Diagnosis not present

## 2022-04-23 DIAGNOSIS — Z7901 Long term (current) use of anticoagulants: Secondary | ICD-10-CM | POA: Diagnosis not present

## 2022-04-24 DIAGNOSIS — B9620 Unspecified Escherichia coli [E. coli] as the cause of diseases classified elsewhere: Secondary | ICD-10-CM | POA: Diagnosis not present

## 2022-04-24 DIAGNOSIS — I483 Typical atrial flutter: Secondary | ICD-10-CM | POA: Diagnosis not present

## 2022-04-24 DIAGNOSIS — Z792 Long term (current) use of antibiotics: Secondary | ICD-10-CM | POA: Diagnosis not present

## 2022-04-24 DIAGNOSIS — N390 Urinary tract infection, site not specified: Secondary | ICD-10-CM | POA: Diagnosis not present

## 2022-04-24 DIAGNOSIS — Z7901 Long term (current) use of anticoagulants: Secondary | ICD-10-CM | POA: Diagnosis not present

## 2022-04-24 DIAGNOSIS — I10 Essential (primary) hypertension: Secondary | ICD-10-CM | POA: Diagnosis not present

## 2022-04-27 DIAGNOSIS — Z7901 Long term (current) use of anticoagulants: Secondary | ICD-10-CM | POA: Diagnosis not present

## 2022-04-27 DIAGNOSIS — I483 Typical atrial flutter: Secondary | ICD-10-CM | POA: Diagnosis not present

## 2022-04-27 DIAGNOSIS — Z792 Long term (current) use of antibiotics: Secondary | ICD-10-CM | POA: Diagnosis not present

## 2022-04-27 DIAGNOSIS — B9620 Unspecified Escherichia coli [E. coli] as the cause of diseases classified elsewhere: Secondary | ICD-10-CM | POA: Diagnosis not present

## 2022-04-27 DIAGNOSIS — N390 Urinary tract infection, site not specified: Secondary | ICD-10-CM | POA: Diagnosis not present

## 2022-04-27 DIAGNOSIS — I10 Essential (primary) hypertension: Secondary | ICD-10-CM | POA: Diagnosis not present

## 2022-04-28 DIAGNOSIS — B9620 Unspecified Escherichia coli [E. coli] as the cause of diseases classified elsewhere: Secondary | ICD-10-CM | POA: Diagnosis not present

## 2022-04-28 DIAGNOSIS — I10 Essential (primary) hypertension: Secondary | ICD-10-CM | POA: Diagnosis not present

## 2022-04-28 DIAGNOSIS — I483 Typical atrial flutter: Secondary | ICD-10-CM | POA: Diagnosis not present

## 2022-04-28 DIAGNOSIS — N390 Urinary tract infection, site not specified: Secondary | ICD-10-CM | POA: Diagnosis not present

## 2022-04-28 DIAGNOSIS — M810 Age-related osteoporosis without current pathological fracture: Secondary | ICD-10-CM | POA: Diagnosis not present

## 2022-04-28 DIAGNOSIS — Z13820 Encounter for screening for osteoporosis: Secondary | ICD-10-CM | POA: Diagnosis not present

## 2022-04-28 DIAGNOSIS — Z792 Long term (current) use of antibiotics: Secondary | ICD-10-CM | POA: Diagnosis not present

## 2022-04-28 DIAGNOSIS — Z7901 Long term (current) use of anticoagulants: Secondary | ICD-10-CM | POA: Diagnosis not present

## 2022-04-29 DIAGNOSIS — I483 Typical atrial flutter: Secondary | ICD-10-CM | POA: Diagnosis not present

## 2022-04-29 DIAGNOSIS — Z792 Long term (current) use of antibiotics: Secondary | ICD-10-CM | POA: Diagnosis not present

## 2022-04-29 DIAGNOSIS — B9620 Unspecified Escherichia coli [E. coli] as the cause of diseases classified elsewhere: Secondary | ICD-10-CM | POA: Diagnosis not present

## 2022-04-29 DIAGNOSIS — N390 Urinary tract infection, site not specified: Secondary | ICD-10-CM | POA: Diagnosis not present

## 2022-04-29 DIAGNOSIS — I10 Essential (primary) hypertension: Secondary | ICD-10-CM | POA: Diagnosis not present

## 2022-04-29 DIAGNOSIS — Z7901 Long term (current) use of anticoagulants: Secondary | ICD-10-CM | POA: Diagnosis not present

## 2022-05-01 DIAGNOSIS — N390 Urinary tract infection, site not specified: Secondary | ICD-10-CM | POA: Diagnosis not present

## 2022-05-06 DIAGNOSIS — Z125 Encounter for screening for malignant neoplasm of prostate: Secondary | ICD-10-CM | POA: Diagnosis not present

## 2022-05-07 ENCOUNTER — Ambulatory Visit (INDEPENDENT_AMBULATORY_CARE_PROVIDER_SITE_OTHER): Payer: Medicare Other | Admitting: Urology

## 2022-05-07 ENCOUNTER — Encounter: Payer: Self-pay | Admitting: Urology

## 2022-05-07 VITALS — BP 115/80 | HR 77 | Wt 139.0 lb

## 2022-05-07 DIAGNOSIS — N401 Enlarged prostate with lower urinary tract symptoms: Secondary | ICD-10-CM

## 2022-05-07 DIAGNOSIS — H25813 Combined forms of age-related cataract, bilateral: Secondary | ICD-10-CM | POA: Diagnosis not present

## 2022-05-07 DIAGNOSIS — R339 Retention of urine, unspecified: Secondary | ICD-10-CM

## 2022-05-07 DIAGNOSIS — N390 Urinary tract infection, site not specified: Secondary | ICD-10-CM | POA: Diagnosis not present

## 2022-05-07 DIAGNOSIS — R3915 Urgency of urination: Secondary | ICD-10-CM | POA: Diagnosis not present

## 2022-05-07 DIAGNOSIS — N138 Other obstructive and reflux uropathy: Secondary | ICD-10-CM | POA: Diagnosis not present

## 2022-05-07 LAB — BLADDER SCAN AMB NON-IMAGING: Scan Result: 111

## 2022-05-07 MED ORDER — CEPHALEXIN 250 MG PO CAPS: 250.0000 mg | ORAL_CAPSULE | Freq: Every day | ORAL | 1 refills | Status: AC

## 2022-05-07 MED ORDER — FINASTERIDE 5 MG PO TABS: 5.0000 mg | ORAL_TABLET | Freq: Every day | ORAL | 3 refills | Status: DC

## 2022-05-07 NOTE — Progress Notes (Signed)
Subjective: 1. Urinary tract infection without hematuria, site unspecified   2. BPH with urinary obstruction   3. Urgency of urination   4. Incomplete bladder emptying    05/07/22: Mr. Kuyper returns today in f/u for his history of and elevated PSA and recurrent UTI's.  His PVR today is 156m.  He was seen in the ER on 03/17/22 for AMS and fevere with leukocytosis and was found to have an e. Coli UTI.  Had a CT and renal UKoreathat showed no stones or obstruction but there was a mildly complex RUP renal cyst 1.6cm in size.  His prostate has a transverse diameter of 5cm.  He remains on tamsulosin for BPH with BOO.  He is on Cefuroxime at this time for another e. Coli UTI on culture on 05/04/22.  He has some dysuria.  His IPSS is 27 with nocturia x 4.   UA has 6-10 WBC and few bacteria.   04/10/21: Mr. MWoodmanseereturns today in f/u for his history of an elevated PSA.  The PSA is down to 6.0 with an 8.2% f/t ratio on 04/03/21.   He is on tamsulosin for BPH with BOO.  He had e. Coli on his culture on 01/09/21 and was treated with bactrim and his urine is clear today.   10/09/21: Mr. MKeilreturns today in f/u.  His PSA prior to this visit is 6.2 with a 9.5% f/t ratio.  This is stable over the past year.  He had a CT in September that showed prostate enlargement and right renal cysts.  He remains on tamsulosin.   He has a history of UTI's.  He has no new voiding complaints.  He has no hematuria.  His UA is clear.     Gu hx: Mr. MQinis a 71yo who is sent for a PSA of 7.1 on labs from 10/11/20.  [place] for BPH with BOO and was given tamsulosin but failed to f/u.   He remains on tamsulosin.   He has a long history of alcoholism and was not felt to be a good candidate for PSA screening at that time.   He is currently in assisted living facility.  He had the PSA checked because of LUTS.  He has some frequency and urgency and UUI.  He has some hesitancy with an intermittent stream.   He has an  adequate stream.  He has had no hematuria or dysuria.  He has had dark urine with some odor.  His UA today has Pyuria and bacteriuria.   [facility]  His Cr was 1.07.  He was seen for a sigmoid volvulus at that time.  On the CT his prostate was large but he wasn't in retention.  He didn't have a foley.   PVR today is 431m   IPSS     Row Name 05/07/22 1000         International Prostate Symptom Score   How often have you had the sensation of not emptying your bladder? More than half the time     How often have you had to urinate less than every two hours? More than half the time     How often have you found you stopped and started again several times when you urinated? About half the time     How often have you found it difficult to postpone urination? About half the time  How often have you had a weak urinary stream? More than half the time     How often have you had to strain to start urination? Almost always     How many times did you typically get up at night to urinate? 4 Times     Total IPSS Score 27       Quality of Life due to urinary symptoms   If you were to spend the rest of your life with your urinary condition just the way it is now how would you feel about that? Terrible              ROS:  ROS  No Known Allergies  Past Medical History:  Diagnosis Date   Alcohol abuse    Arrhythmia    Cataplexy    Glaucoma    Heart disease    Hypertension    Narcolepsy    Narcolepsy     Past Surgical History:  Procedure Laterality Date   FLEXIBLE SIGMOIDOSCOPY N/A 02/12/2021   Procedure: FLEXIBLE SIGMOIDOSCOPY;  Surgeon: Harvel Quale, MD;  Location: [facility];  Service: Gastroenterology;  Laterality: N/A;   HERNIA REPAIR     PARTIAL COLECTOMY N/A 02/14/2021   Procedure: PARTIAL COLECTOMY;  Surgeon: Aviva Signs, MD;  Location: [facility] ORS;  Service: General;  Laterality: N/A;    Social History   Socioeconomic History    Marital status: Legally Separated    Spouse name: Not on file   Number of children: Not on file   Years of education: Not on file   Highest education level: Not on file  Occupational History   Not on file  Tobacco Use   Smoking status: Every Day    Packs/day: 1.00    Years: 30.00    Total pack years: 30.00    Types: Cigarettes   Smokeless tobacco: Never  Vaping Use   Vaping Use: Never used  Substance and Sexual Activity   Alcohol use: Yes    Alcohol/week: 21.0 standard drinks of alcohol    Types: 21 Cans of beer per week    Comment: 12 pack of beer daily.    Drug use: Yes    Types: Marijuana   Sexual activity: Not on file  Other Topics Concern   Not on file  Social History Narrative   Not on file   Social Determinants of Health   Financial Resource Strain: Not on file  Food Insecurity: Not on file  Transportation Needs: Not on file  Physical Activity: Not on file  Stress: Not on file  Social Connections: Not on file  Intimate Partner Violence: Not on file    Family History  Family history unknown: Yes    Anti-infectives: Anti-infectives (From admission, onward)    Start     Dose/Rate Route Frequency Ordered Stop   05/07/22 0000  cephALEXin (KEFLEX) 250 MG capsule        250 mg Oral Daily at bedtime 05/07/22 1017 11/03/22 2359       Current Outpatient Medications  Medication Sig Dispense Refill   amLODipine (NORVASC) 5 MG tablet Take 1 tablet by mouth daily.     cefUROXime (CEFTIN) 250 MG tablet Take 250 mg by mouth 2 (two) times daily.     cephALEXin (KEFLEX) 250 MG capsule Take 1 capsule (250 mg total) by mouth at bedtime. 90 capsule 1   COMBIGAN 0.2-0.5 % ophthalmic solution Place 1 drop into both eyes every 12 (twelve) hours.  finasteride (PROSCAR) 5 MG tablet Take 1 tablet (5 mg total) by mouth daily. 90 tablet 3   folic acid (FOLVITE) 1 MG tablet Take 1 tablet (1 mg total) by mouth daily. 30 tablet 1   furosemide (LASIX) 20 MG tablet Take 10 mg by  mouth daily.     LUMIGAN 0.01 % SOLN Place 1 drop into both eyes at bedtime.     QUEtiapine (SEROQUEL) 25 MG tablet Take 12.5 mg by mouth at bedtime.     rosuvastatin (CRESTOR) 10 MG tablet Take 10 mg by mouth at bedtime.     tamsulosin (FLOMAX) 0.4 MG CAPS capsule Take 0.4 mg by mouth daily.     thiamine (VITAMIN B-1) 100 MG tablet Take 100 mg by mouth daily.     tiZANidine (ZANAFLEX) 2 MG tablet Take 2 mg by mouth at bedtime.     apixaban (ELIQUIS) 5 MG TABS tablet Take 1 tablet (5 mg total) by mouth 2 (two) times daily. 60 tablet 0   No current facility-administered medications for this visit.     Objective: Vital signs in last 24 hours: BP 115/80   Pulse 77   Wt 139 lb (63 kg)   BMI 21.77 kg/m   Intake/Output from previous day: No intake/output data recorded. Intake/Output this shift: '@IOTHISSHIFT'$ @   Physical Exam  Lab Results:  Recent Results (from the past 2160 hour(s))  Urinalysis, Routine w reflex microscopic Urine, Clean Catch     Status: Abnormal   Collection Time: 03/17/22  1:35 PM  Result Value Ref Range   Color, Urine YELLOW YELLOW   APPearance HAZY (A) CLEAR   Specific Gravity, Urine 1.020 1.005 - 1.030   pH 7.0 5.0 - 8.0   Glucose, UA NEGATIVE NEGATIVE mg/dL   Hgb urine dipstick SMALL (A) NEGATIVE   Bilirubin Urine NEGATIVE NEGATIVE   Ketones, ur 5 (A) NEGATIVE mg/dL   Protein, ur 30 (A) NEGATIVE mg/dL   Nitrite POSITIVE (A) NEGATIVE   Leukocytes,Ua SMALL (A) NEGATIVE   RBC / HPF 0-5 0 - 5 RBC/hpf   WBC, UA 21-50 0 - 5 WBC/hpf   Bacteria, UA MANY (A) NONE SEEN   Squamous Epithelial / LPF 0-5 0 - 5   Mucus PRESENT     Comment: Performed at [facility], [address] [place], [place] [address]  Urine Culture     Status: Abnormal   Collection Time: 03/17/22  1:36 PM   Specimen: Urine, Clean Catch  Result Value Ref Range   Specimen Description      URINE, CLEAN CATCH Performed at [facility], [address] [place], [place] [address]    Special  Requests      NONE Performed at [facility], [address] [place], [place] [address]    Culture 50,000 COLONIES/mL ESCHERICHIA COLI (A)    Report Status 03/19/2022 FINAL    Organism ID, Bacteria ESCHERICHIA COLI (A)       Susceptibility   Escherichia coli - MIC*    AMPICILLIN >=32 RESISTANT Resistant     CEFAZOLIN 8 SENSITIVE Sensitive     CEFEPIME <=0.12 SENSITIVE Sensitive     CEFTRIAXONE <=0.25 SENSITIVE Sensitive     CIPROFLOXACIN >=4 RESISTANT Resistant     GENTAMICIN <=1 SENSITIVE Sensitive     IMIPENEM <=0.25 SENSITIVE Sensitive     NITROFURANTOIN <=16 SENSITIVE Sensitive     TRIMETH/SULFA >=320 RESISTANT Resistant     AMPICILLIN/SULBACTAM >=32 RESISTANT Resistant     PIP/TAZO <=4 SENSITIVE Sensitive     *  50,000 COLONIES/mL ESCHERICHIA COLI  CBC with Differential     Status: Abnormal   Collection Time: 03/17/22  2:13 PM  Result Value Ref Range   WBC 14.4 (H) 4.0 - 10.5 K/uL   RBC 4.82 4.22 - 5.81 MIL/uL   Hemoglobin 14.4 13.0 - 17.0 g/dL   HCT 43.8 39.0 - 52.0 %   MCV 90.9 80.0 - 100.0 fL   MCH 29.9 26.0 - 34.0 pg   MCHC 32.9 30.0 - 36.0 g/dL   RDW 14.3 11.5 - 15.5 %   Platelets 193 150 - 400 K/uL   nRBC 0.0 0.0 - 0.2 %   Neutrophils Relative % 83 %   Neutro Abs 11.8 (H) 1.7 - 7.7 K/uL   Lymphocytes Relative 11 %   Lymphs Abs 1.6 0.7 - 4.0 K/uL   Monocytes Relative 6 %   Monocytes Absolute 0.9 0.1 - 1.0 K/uL   Eosinophils Relative 0 %   Eosinophils Absolute 0.0 0.0 - 0.5 K/uL   Basophils Relative 0 %   Basophils Absolute 0.0 0.0 - 0.1 K/uL   Immature Granulocytes 0 %   Abs Immature Granulocytes 0.06 0.00 - 0.07 K/uL    Comment: Performed at [facility], [address], [place], [place] [address]  Basic metabolic panel     Status: Abnormal   Collection Time: 03/17/22  2:13 PM  Result Value Ref Range   Sodium 140 135 - 145 mmol/L   Potassium 4.0 3.5 - 5.1 mmol/L   Chloride 108 98 - 111 mmol/L   CO2 23 22 - 32 mmol/L   Glucose, Bld 114 (H) 70 - 99 mg/dL     Comment: Glucose reference range applies only to samples taken after fasting for at least 8 hours.   BUN 16 8 - 23 mg/dL   Creatinine, Ser 1.06 0.61 - 1.24 mg/dL   Calcium 9.7 8.9 - 10.3 mg/dL   GFR, Estimated >60 >60 mL/min    Comment: (NOTE) Calculated using the CKD-EPI Creatinine Equation (2021)    Anion gap 9 5 - 15    Comment: Performed at [facility], [address] [place], [place] [address]  Lactic acid, plasma     Status: None   Collection Time: 03/17/22  2:13 PM  Result Value Ref Range   Lactic Acid, Venous 1.4 0.5 - 1.9 mmol/L    Comment: Performed at [facility], [address], [place], [place] [address]  Culture, blood (routine x 2)     Status: None   Collection Time: 03/17/22  2:14 PM   Specimen: BLOOD LEFT ARM  Result Value Ref Range   Specimen Description BLOOD LEFT ARM    Special Requests      BOTTLES DRAWN AEROBIC AND ANAEROBIC Blood Culture results may not be optimal due to an excessive volume of blood received in culture bottles   Culture      NO GROWTH 5 DAYS Performed at [facility], [address], [place], [place] [address]    Report Status 03/22/2022 FINAL   Culture, blood (routine x 2)     Status: None   Collection Time: 03/17/22  2:23 PM   Specimen: Right Antecubital; Blood  Result Value Ref Range   Specimen Description RIGHT ANTECUBITAL    Special Requests      BOTTLES DRAWN AEROBIC AND ANAEROBIC Blood Culture results may not be optimal due to an excessive volume of blood received in culture bottles   Culture      NO GROWTH 5  DAYS Performed at [facility], [address], [place], [place] [address]    Report Status 03/22/2022 FINAL   Lactic acid, plasma     Status: None   Collection Time: 03/17/22  4:00 PM  Result Value Ref Range   Lactic Acid, Venous 1.2 0.5 - 1.9 mmol/L    Comment: Performed at [facility], [address], [place], [place] [address]  Troponin I (High Sensitivity)     Status: None   Collection Time: 03/17/22  9:43 PM   Result Value Ref Range   Troponin I (High Sensitivity) 14 <18 ng/L    Comment: (NOTE) Elevated high sensitivity troponin I (hsTnI) values and significant  changes across serial measurements may suggest ACS but many other  chronic and acute conditions are known to elevate hsTnI results.  Refer to the "Links" section for chest pain algorithms and additional  guidance. Performed at [facility], [address], [place], [place] [address]   HIV Antibody (routine testing w rflx)     Status: None   Collection Time: 03/18/22  6:05 AM  Result Value Ref Range   HIV Screen 4th Generation wRfx Non Reactive Non Reactive    Comment: Performed at [facility], [place] [address], [place], [place] [address]  Basic metabolic panel     Status: Abnormal   Collection Time: 03/18/22  6:05 AM  Result Value Ref Range   Sodium 139 135 - 145 mmol/L   Potassium 3.6 3.5 - 5.1 mmol/L   Chloride 109 98 - 111 mmol/L   CO2 22 22 - 32 mmol/L   Glucose, Bld 108 (H) 70 - 99 mg/dL    Comment: Glucose reference range applies only to samples taken after fasting for at least 8 hours.   BUN 14 8 - 23 mg/dL   Creatinine, Ser 0.88 0.61 - 1.24 mg/dL   Calcium 8.9 8.9 - 10.3 mg/dL   GFR, Estimated >60 >60 mL/min    Comment: (NOTE) Calculated using the CKD-EPI Creatinine Equation (2021)    Anion gap 8 5 - 15    Comment: Performed at [facility], [address], [place], [place] [address]  CBC     Status: Abnormal   Collection Time: 03/18/22  6:05 AM  Result Value Ref Range   WBC 13.5 (H) 4.0 - 10.5 K/uL   RBC 4.24 4.22 - 5.81 MIL/uL   Hemoglobin 12.6 (L) 13.0 - 17.0 g/dL   HCT 37.6 (L) 39.0 - 52.0 %   MCV 88.7 80.0 - 100.0 fL   MCH 29.7 26.0 - 34.0 pg   MCHC 33.5 30.0 - 36.0 g/dL   RDW 14.3 11.5 - 15.5 %   Platelets 184 150 - 400 K/uL   nRBC 0.0 0.0 - 0.2 %    Comment: Performed at [facility], [address], [place],  [address]  Glucose, capillary     Status: Abnormal   Collection Time: 03/18/22   3:45 PM  Result Value Ref Range   Glucose-Capillary 122 (H) 70 - 99 mg/dL    Comment: Glucose reference range applies only to samples taken after fasting for at least 8 hours.  Comprehensive metabolic panel     Status: Abnormal   Collection Time: 03/19/22  5:24 AM  Result Value Ref Range   Sodium 140 135 - 145 mmol/L   Potassium 3.7 3.5 - 5.1 mmol/L   Chloride 111 98 - 111 mmol/L   CO2 23 22 - 32 mmol/L   Glucose, Bld 92  70 - 99 mg/dL    Comment: Glucose reference range applies only to samples taken after fasting for at least 8 hours.   BUN 18 8 - 23 mg/dL   Creatinine, Ser 0.91 0.61 - 1.24 mg/dL   Calcium 8.6 (L) 8.9 - 10.3 mg/dL   Total Protein 6.0 (L) 6.5 - 8.1 g/dL   Albumin 3.1 (L) 3.5 - 5.0 g/dL   AST 17 15 - 41 U/L   ALT 29 0 - 44 U/L   Alkaline Phosphatase 48 38 - 126 U/L   Total Bilirubin 0.6 0.3 - 1.2 mg/dL   GFR, Estimated >60 >60 mL/min    Comment: (NOTE) Calculated using the CKD-EPI Creatinine Equation (2021)    Anion gap 6 5 - 15    Comment: Performed at [facility], [address], [place], [place] [address]  CBC     Status: Abnormal   Collection Time: 03/19/22  5:24 AM  Result Value Ref Range   WBC 10.9 (H) 4.0 - 10.5 K/uL   RBC 4.09 (L) 4.22 - 5.81 MIL/uL   Hemoglobin 12.4 (L) 13.0 - 17.0 g/dL   HCT 36.8 (L) 39.0 - 52.0 %   MCV 90.0 80.0 - 100.0 fL   MCH 30.3 26.0 - 34.0 pg   MCHC 33.7 30.0 - 36.0 g/dL   RDW 14.3 11.5 - 15.5 %   Platelets 165 150 - 400 K/uL   nRBC 0.0 0.0 - 0.2 %    Comment: Performed at [facility], [address] [place], [place] [address]  Magnesium     Status: None   Collection Time: 03/19/22  5:45 AM  Result Value Ref Range   Magnesium 2.2 1.7 - 2.4 mg/dL    Comment: Performed at [facility], [address] [place], [place] [address]  Comprehensive metabolic panel     Status: Abnormal   Collection Time: 03/20/22  5:25 AM  Result Value Ref Range   Sodium 137 135 - 145 mmol/L   Potassium 4.1 3.5 - 5.1 mmol/L   Chloride 109 98  - 111 mmol/L   CO2 23 22 - 32 mmol/L   Glucose, Bld 86 70 - 99 mg/dL    Comment: Glucose reference range applies only to samples taken after fasting for at least 8 hours.   BUN 15 8 - 23 mg/dL   Creatinine, Ser 1.00 0.61 - 1.24 mg/dL   Calcium 8.3 (L) 8.9 - 10.3 mg/dL   Total Protein 6.0 (L) 6.5 - 8.1 g/dL   Albumin 3.1 (L) 3.5 - 5.0 g/dL   AST 17 15 - 41 U/L   ALT 28 0 - 44 U/L   Alkaline Phosphatase 48 38 - 126 U/L   Total Bilirubin 0.5 0.3 - 1.2 mg/dL   GFR, Estimated >60 >60 mL/min    Comment: (NOTE) Calculated using the CKD-EPI Creatinine Equation (2021)    Anion gap 5 5 - 15    Comment: Performed at [facility], [address] [place], [place] [address]  CBC     Status: Abnormal   Collection Time: 03/20/22  5:25 AM  Result Value Ref Range   WBC 6.9 4.0 - 10.5 K/uL   RBC 4.11 (L) 4.22 - 5.81 MIL/uL   Hemoglobin 12.3 (L) 13.0 - 17.0 g/dL   HCT 37.2 (L) 39.0 - 52.0 %   MCV 90.5 80.0 - 100.0 fL   MCH 29.9 26.0 - 34.0 pg   MCHC 33.1 30.0 - 36.0 g/dL   RDW 14.2 11.5 -  15.5 %   Platelets 183 150 - 400 K/uL   nRBC 0.0 0.0 - 0.2 %    Comment: Performed at [facility], [address], [place], [place] [address]  Comprehensive metabolic panel     Status: Abnormal   Collection Time: 03/21/22  5:11 AM  Result Value Ref Range   Sodium 137 135 - 145 mmol/L   Potassium 3.9 3.5 - 5.1 mmol/L   Chloride 108 98 - 111 mmol/L   CO2 23 22 - 32 mmol/L   Glucose, Bld 95 70 - 99 mg/dL    Comment: Glucose reference range applies only to samples taken after fasting for at least 8 hours.   BUN 12 8 - 23 mg/dL   Creatinine, Ser 0.94 0.61 - 1.24 mg/dL   Calcium 8.8 (L) 8.9 - 10.3 mg/dL   Total Protein 6.4 (L) 6.5 - 8.1 g/dL   Albumin 3.3 (L) 3.5 - 5.0 g/dL   AST 19 15 - 41 U/L   ALT 29 0 - 44 U/L   Alkaline Phosphatase 57 38 - 126 U/L   Total Bilirubin 0.6 0.3 - 1.2 mg/dL   GFR, Estimated >60 >60 mL/min    Comment: (NOTE) Calculated using the CKD-EPI Creatinine Equation (2021)    Anion  gap 6 5 - 15    Comment: Performed at [facility], [address], [place], [place] [address]  CBC     Status: None   Collection Time: 03/21/22  5:11 AM  Result Value Ref Range   WBC 5.8 4.0 - 10.5 K/uL   RBC 4.42 4.22 - 5.81 MIL/uL   Hemoglobin 13.2 13.0 - 17.0 g/dL   HCT 39.2 39.0 - 52.0 %   MCV 88.7 80.0 - 100.0 fL   MCH 29.9 26.0 - 34.0 pg   MCHC 33.7 30.0 - 36.0 g/dL   RDW 13.8 11.5 - 15.5 %   Platelets 214 150 - 400 K/uL   nRBC 0.0 0.0 - 0.2 %    Comment: Performed at [facility] [address] [place], [place] [address]  BLADDER SCAN AMB NON-IMAGING     Status: None   Collection Time: 05/07/22 10:01 AM  Result Value Ref Range   Scan Result 111       BMET No results for input(s): "NA", "K", "CL", "CO2", "GLUCOSE", "BUN", "CREATININE", "CALCIUM" in the last 72 hours. PT/INR No results for input(s): "LABPROT", "INR" in the last 72 hours. ABG No results for input(s): "PHART", "HCO3" in the last 72 hours.  Invalid input(s): "PCO2", "PO2" UA has pyuria and many bacteria but is nit-.  Studies/Results: US RENAL  Result Date: 03/20/2022 CLINICAL DATA:  Right renal lesion follow-up. EXAM: RENAL / URINARY TRACT ULTRASOUND COMPLETE COMPARISON:  CT abdomen pelvis from yesterday. FINDINGS: Right Kidney: Renal measurements: 11.1 x 4.5 x 5.8 cm = volume: 152 mL. Echogenicity within normal limits. No mass or hydronephrosis visualized. 3.9 cm simple cyst in the lower pole. No follow-up imaging is recommended. 1.6 cm homogeneously hypoechoic lesion with posterior acoustic enhancement and no internal vascularity in the upper pole, consistent with complex cyst. Left Kidney: Renal measurements: 9.8 x 6.5 x 6.0 cm = volume: 198 mL. Echogenicity within normal limits. No mass or hydronephrosis visualized. Bladder: Appears normal for degree of bladder distention. Other: None. IMPRESSION: 1. 1.6 cm right upper pole renal lesion seen on CT corresponds to a complex cyst. No follow-up imaging is  recommended. Electronically Signed   By: Titus Dubin M.D.   On:  03/20/2022 10:10   CT ABDOMEN PELVIS W CONTRAST  Result Date: 03/19/2022 CLINICAL DATA:  Abdominal pain, decreased oral intake EXAM: CT ABDOMEN AND PELVIS WITH CONTRAST TECHNIQUE: Multidetector CT imaging of the abdomen and pelvis was performed using the standard protocol following bolus administration of intravenous contrast. RADIATION DOSE REDUCTION: This exam was performed according to the departmental dose-optimization program which includes automated exposure control, adjustment of the mA and/or kV according to patient size and/or use of iterative reconstruction technique. CONTRAST:  131m OMNIPAQUE IOHEXOL 300 MG/ML  SOLN COMPARISON:  05/30/2021 FINDINGS: Lower chest: There are small linear densities in the lower lung fields suggesting scarring or subsegmental atelectasis. Heart is enlarged in size. Coronary artery calcifications are seen. Small pericardial effusion is present. Hepatobiliary: There is fatty infiltration in the liver. There is no dilation of bile ducts. Gallbladder is unremarkable. Pancreas: There is prominence of pancreatic duct. No focal abnormalities are seen. Spleen: Unremarkable. Adrenals/Urinary Tract: Right adrenal is unremarkable. There is 1.8 cm nodule in left adrenal which has not changed, possibly suggesting adenoma. There is no hydronephrosis. There are no renal or ureteral stones. There are a few smooth marginated low-density lesions in both kidneys largest measuring 4.2 cm in size in the right kidney suggesting renal cysts. In image 33 of series 2, there is 1.6 cm smoothly marginated lesion with density measurements higher than usual simple cyst suggesting possible hemorrhagic cyst or solid nodule. Urinary bladder is not distended. Stomach/Bowel: Stomach is unremarkable. Small bowel loops are not dilated. Appendix is not dilated. There is no significant wall thickening in colon. There is no pericolic  stranding. Surgical staples are seen in sigmoid colon. Moderate amount of stool is seen in rectum. Vascular/Lymphatic: Atherosclerotic plaques and calcifications are seen in aorta and its major branches. Reproductive: There is inhomogeneous enhancement in enlarged prostate. Other: There is no ascites or pneumoperitoneum. Linear densities in subcutaneous plane in the periumbilical region has not changed. There is diastasis between the rectus muscles at the level of umbilicus. Musculoskeletal: Degenerative changes are noted in lumbar spine, particularly severe at L4-L5 and L5-S1 levels with encroachment of neural foramina. IMPRESSION: There is no evidence of intestinal obstruction or pneumoperitoneum. Appendix is not dilated. There is no hydronephrosis. 1.8 cm left adrenal nodule is stable, possibly adenoma. There are multiple bilateral renal cysts. There is 1.6 cm smoothly marginated lesion in the posterior upper pole of right kidney with density measurements higher than usual for simple cysts. This may suggest a hemorrhagic cyst or solid lesion. Follow-up renal sonogram may be considered. Enlarged prostate with inhomogeneous patchy enhancement. Coronary artery calcifications are seen. Other findings as described in the body of the report. Electronically Signed   By: PElmer PickerM.D.   On: 03/19/2022 17:48   CT Head Wo Contrast  Result Date: 03/17/2022 CLINICAL DATA:  Mental status.  Recent urinary tract infection. EXAM: CT HEAD WITHOUT CONTRAST TECHNIQUE: Contiguous axial images were obtained from the base of the skull through the vertex without intravenous contrast. RADIATION DOSE REDUCTION: This exam was performed according to the departmental dose-optimization program which includes automated exposure control, adjustment of the mA and/or kV according to patient size and/or use of iterative reconstruction technique. COMPAR[facility]  [facility] FINDINGS: Brain:  Moderate generalized atrophy and white matter hypoattenuation is present bilaterally. A remote lacunar infarct involves the anterior limb of the left internal capsule and caudate head. Basal ganglia are otherwise within normal limits. No acute infarct, hemorrhage, or  mass lesion is present. The ventricles are proportionate to the degree of atrophy. No significant extraaxial fluid collection is present. Vascular: A vascular calcifications are present within the cavernous internal carotid arteries. No hyperdense vessel is present. Skull: Calvarium is intact. No focal lytic or blastic lesions are present. No significant extracranial soft tissue lesion is present. Sinuses/Orbits: The paranasal sinuses and mastoid air cells are clear. The globes and orbits are within normal limits. IMPRESSION: 1. No acute intracranial abnormality. 2. Moderate generalized atrophy and white matter disease likely reflects the sequela of chronic microvascular ischemia. 3. 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 Electronically Signed   By: San Morelle M.D.   On: 03/17/2022 14:47      Assessment/Plan: Recurrent UTI's.  He had e. Coli in July and earlier this week.  I am going to put him on suppressive keflex for at least 3 months.   BPH with BOO and OAB.  He will continue the tamsulosin and will add finasteride.   Side effects reviewed. .       Meds ordered this encounter  Medications   finasteride (PROSCAR) 5 MG tablet    Sig: Take 1 tablet (5 mg total) by mouth daily.    Dispense:  90 tablet    Refill:  3   cephALEXin (KEFLEX) 250 MG capsule    Sig: Take 1 capsule (250 mg total) by mouth at bedtime.    Dispense:  90 capsule    Refill:  1      Orders Placed This Encounter  Procedures   Urine Culture   Urinalysis, Routine w reflex microscopic   BLADDER SCAN AMB NON-IMAGING     Return in about 6 weeks (around 06/18/2022) for JIll for urine check.     CC: Dr. Stoney Bang.      Irine Seal 05/08/2022 [phone] ID: Richardson Landry, male   DOB: 1951/01/22, 71 y.o.   MRN: [id number]

## 2022-05-08 NOTE — Addendum Note (Signed)
Addended by: Iris Pert on: 05/08/2022 09:02 AM   Modules accepted: Orders

## 2022-05-09 LAB — URINE CULTURE

## 2022-05-12 ENCOUNTER — Telehealth: Payer: Self-pay

## 2022-05-12 NOTE — Telephone Encounter (Signed)
-----   Message from Irine Seal, MD sent at 05/12/2022 12:45 PM EDT ----- Negative culture ----- Message ----- From: Dorisann Frames, RN Sent: 05/12/2022   9:17 AM EDT To: Irine Seal, MD  Please review

## 2022-05-12 NOTE — Telephone Encounter (Signed)
Spoke with Sunday Spillers at facility patient resides at and notified of negative urine culture.  Voiced understanding.

## 2022-05-30 IMAGING — CT CT ABD-PELV W/O CM
2 of 5 series · 16 of 46 positions shown, 18 images · non-contrast
Comparison: 11/06/2020

CLINICAL DATA: Abdominal pain, constipation

EXAM:
CT ABDOMEN AND PELVIS WITHOUT CONTRAST
TECHNIQUE: Multidetector CT imaging of the abdomen and pelvis was performed
following the standard protocol without IV contrast.

[Series 2: axial st · axial · 0.74mm/px · z∈[+633,+1008]mm · 13 of 86 slices shown, 15 images]
[im 6/86  soft-tissue]
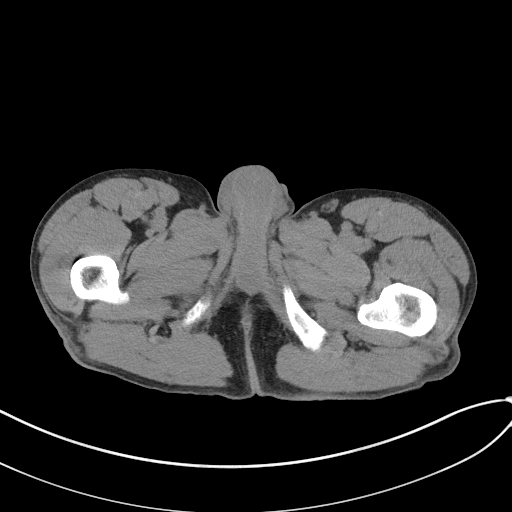
[im 6/86  bone]
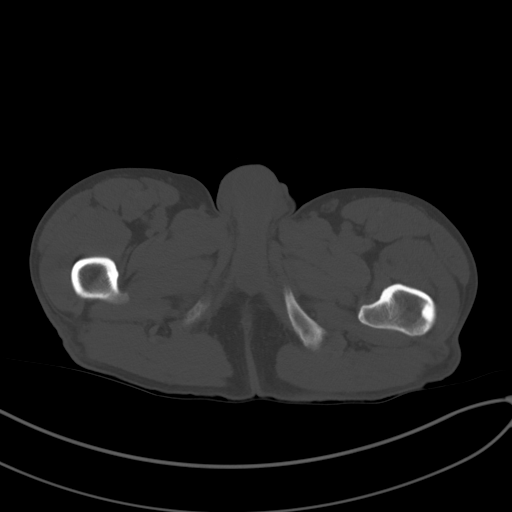
[im 11/86  soft-tissue]
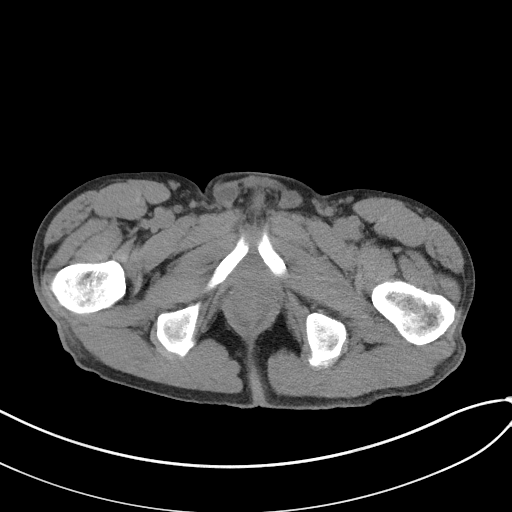
[im 21/86  soft-tissue]
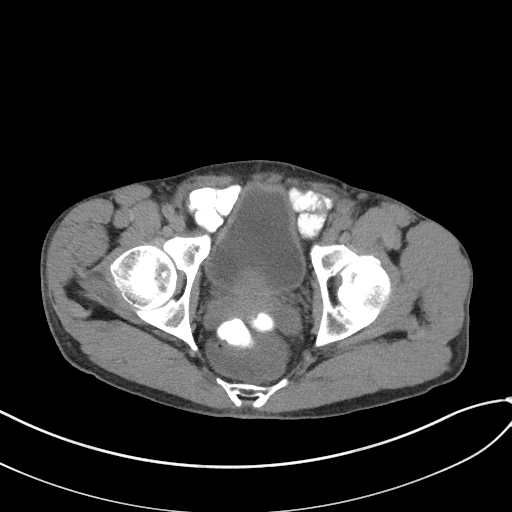
[im 26/86  soft-tissue]
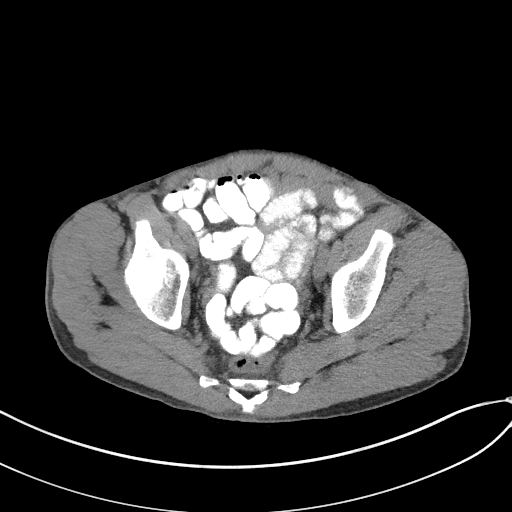
[im 31/86  soft-tissue]
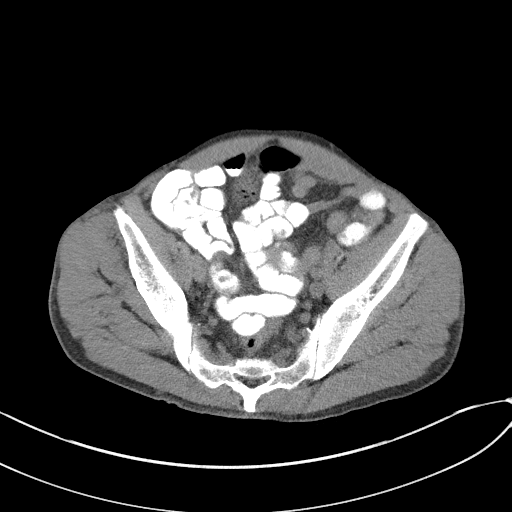
[im 36/86  soft-tissue]
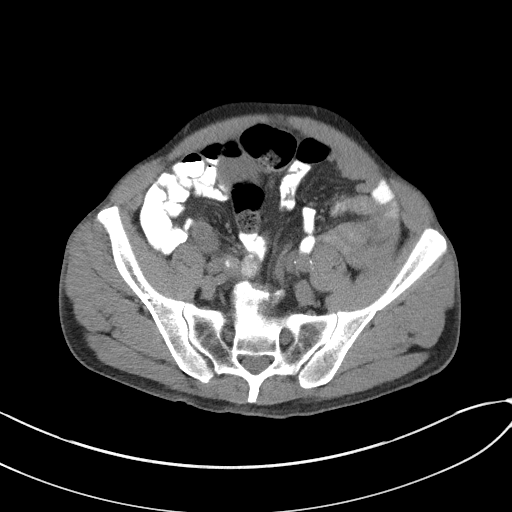
[im 46/86  soft-tissue]
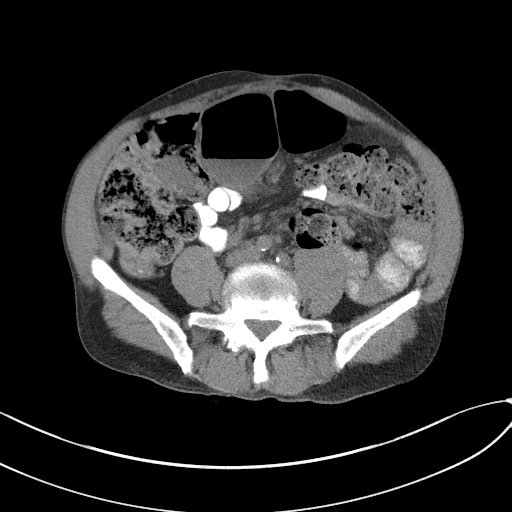
[im 51/86  soft-tissue]
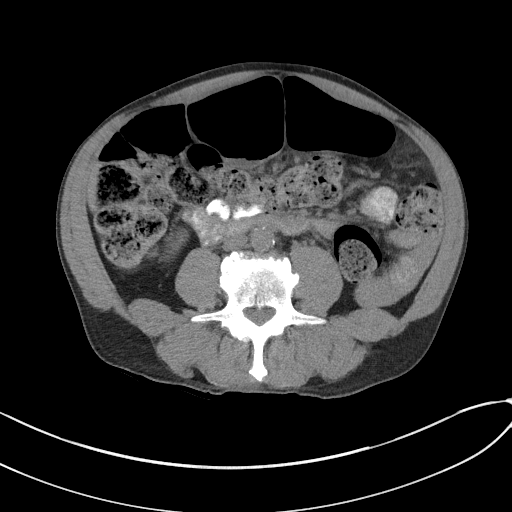
[im 56/86  soft-tissue]
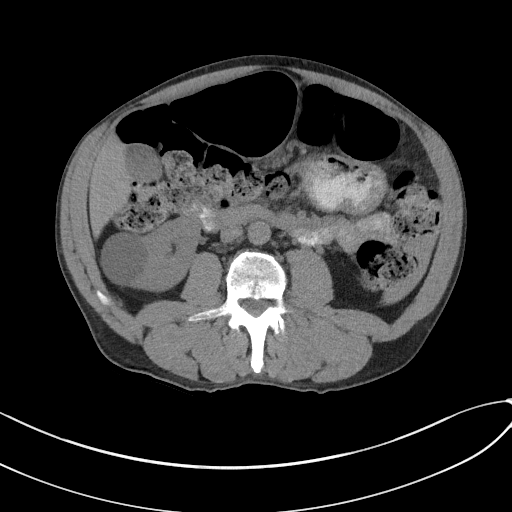
[im 56/86  bone]
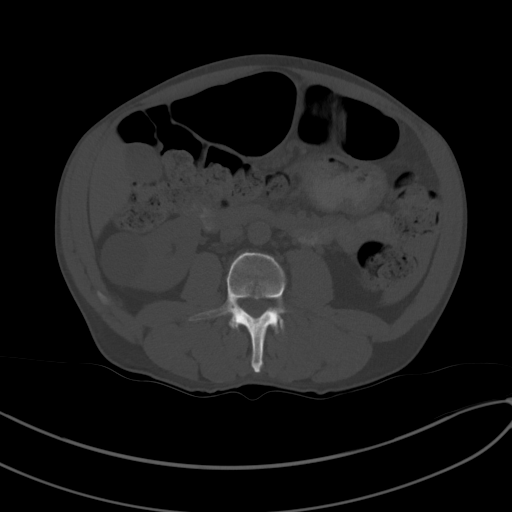
[im 61/86  soft-tissue]
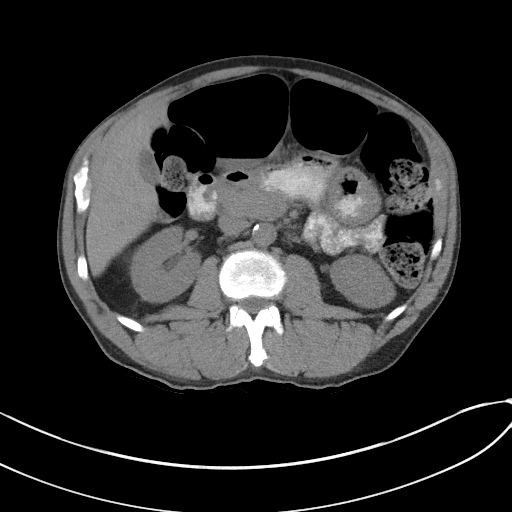
[im 66/86  soft-tissue]
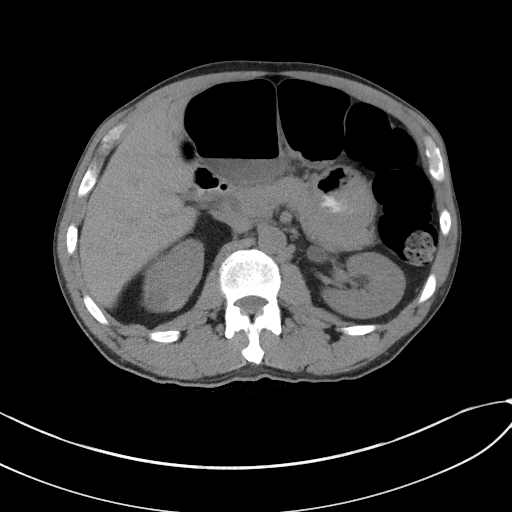
[im 76/86  soft-tissue]
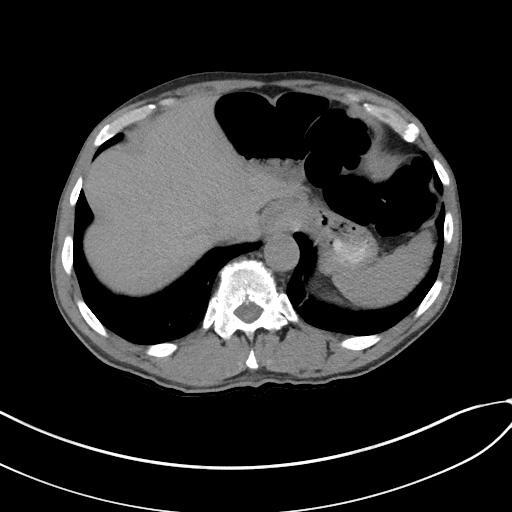
[im 81/86  soft-tissue]
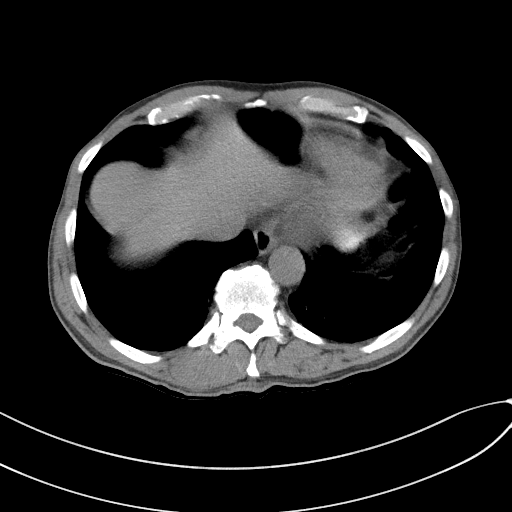

[Series 5: coronal st · coronal · 0.66mm/px · 3 of 99 slices shown]
[im 33/99  soft-tissue]
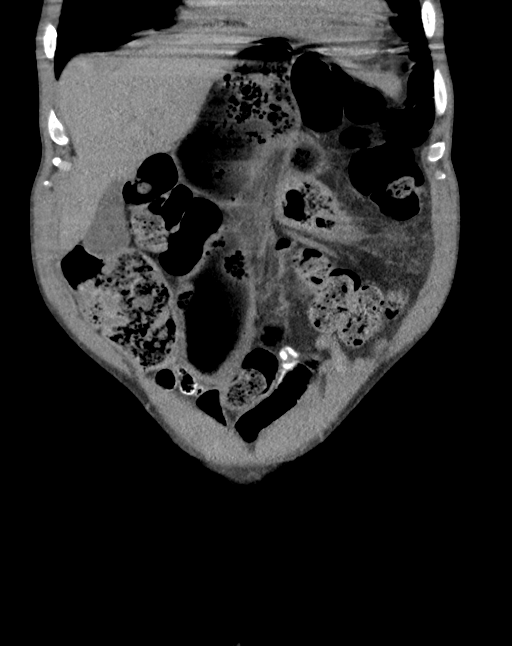
[im 44/99  soft-tissue]
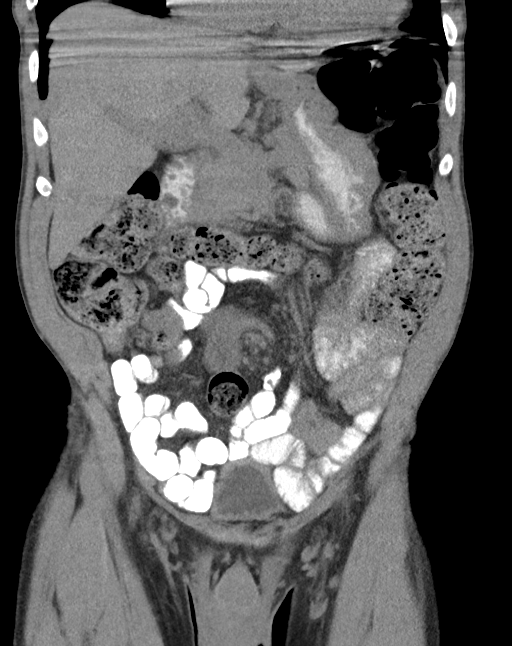
[im 55/99  soft-tissue]
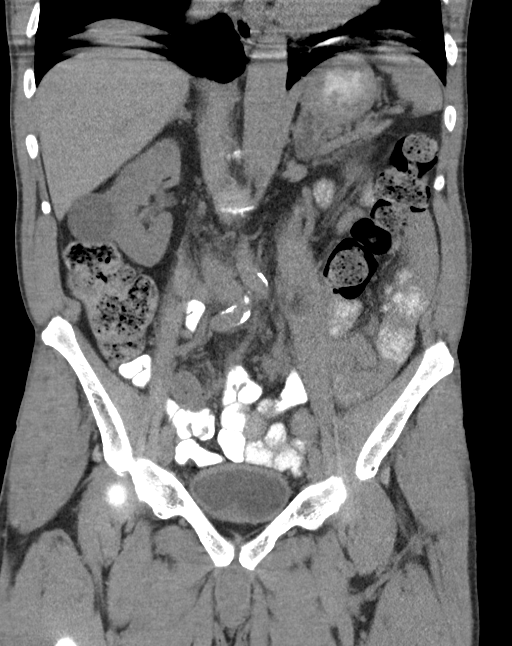

[16 of 46 positions shown; findings below may reference images not displayed]

FINDINGS: Lower chest: Lung bases are essentially clear.

Hepatobiliary: Unenhanced liver is unremarkable.

Gallbladder is unremarkable. No intrahepatic or extrahepatic ductal
dilatation.

Pancreas: Within normal limits.

Spleen: Within normal limits.

Adrenals/Urinary Tract: 19 mm left adrenal adenoma (series 2/image
19). Right adrenal gland is within normal limits.

Two right renal cysts measuring up to 4.0 cm in the right lower pole
(series 2/image 30). Left kidney is within normal limits. No renal
calculi or hydronephrosis.

Mildly thick-walled bladder, although underdistended.

Stomach/Bowel: Stomach is within normal limits.

No evidence of small bowel obstruction.

Dilated sigmoid colon with narrowing in the central mesenteric root
(series 2/image 44). This is recurrent but less severe when compared
to the prior. Sigmoid colon measures up to 6.2 cm on the current
study, without wall thickening or mesenteric edema.

Vascular/Lymphatic: No evidence of abdominal aortic aneurysm.

Atherosclerotic calcifications of the abdominal aorta and branch
vessels.

No suspicious abdominopelvic lymphadenopathy.

Reproductive: Prostatomegaly, suggesting BPH.

Other: No abdominopelvic ascites.

Musculoskeletal: Degenerative changes of the visualized
thoracolumbar spine.
IMPRESSION: Suspected mild recurrent sigmoid volvulus, as described above. This
appearance is recurrent but improved when compared to the prior.

Additional ancillary findings as above.

## 2022-06-01 IMAGING — DX DG ABDOMEN 1V
1 series · 1 of 1 positions shown · non-contrast
Comparison: CT February 10, 2021.  Radiograph of November 07, 2020.

CLINICAL DATA: Volvulus status post sigmoidoscopy.

EXAM:
ABDOMEN - 1 VIEW

[abdomen supine]
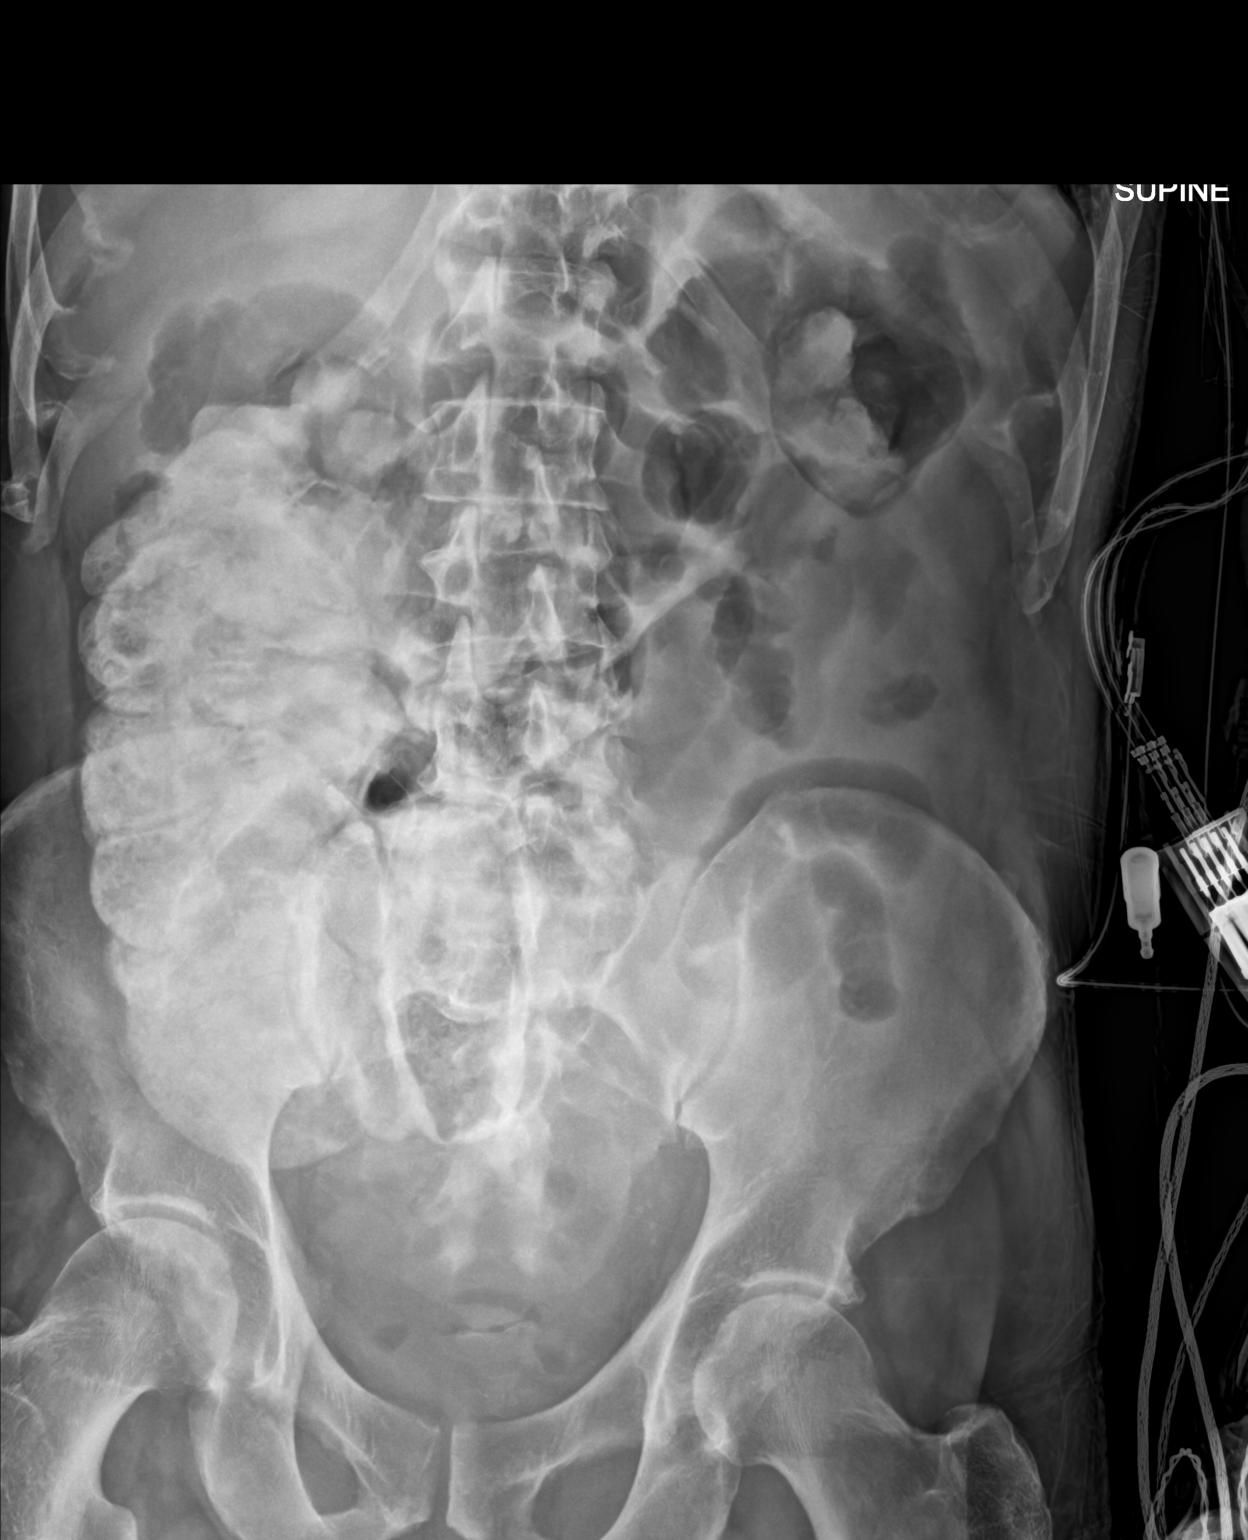

[1 of 1 positions shown; findings below may reference images not displayed]

FINDINGS: Large amount of contrast and stool is seen in what appears to be the
right colon. No significant bowel dilatation is noted currently. No
abnormal calcifications are noted.
IMPRESSION: No significant abnormal bowel dilatation is seen currently.

## 2022-06-11 DIAGNOSIS — Z23 Encounter for immunization: Secondary | ICD-10-CM | POA: Diagnosis not present

## 2022-06-15 ENCOUNTER — Ambulatory Visit (INDEPENDENT_AMBULATORY_CARE_PROVIDER_SITE_OTHER): Payer: Medicare Other | Admitting: Physician Assistant

## 2022-06-15 VITALS — BP 127/84 | HR 105 | Ht 67.0 in | Wt 139.0 lb

## 2022-06-15 DIAGNOSIS — N138 Other obstructive and reflux uropathy: Secondary | ICD-10-CM | POA: Diagnosis not present

## 2022-06-15 DIAGNOSIS — N390 Urinary tract infection, site not specified: Secondary | ICD-10-CM

## 2022-06-15 DIAGNOSIS — N401 Enlarged prostate with lower urinary tract symptoms: Secondary | ICD-10-CM

## 2022-06-15 LAB — URINALYSIS, ROUTINE W REFLEX MICROSCOPIC
Bilirubin, UA: NEGATIVE
Glucose, UA: NEGATIVE
Ketones, UA: NEGATIVE
Leukocytes,UA: NEGATIVE
Nitrite, UA: NEGATIVE
Protein,UA: NEGATIVE
RBC, UA: NEGATIVE
Specific Gravity, UA: 1.015 (ref 1.005–1.030)
Urobilinogen, Ur: 0.2 mg/dL (ref 0.2–1.0)
pH, UA: 5 (ref 5.0–7.5)

## 2022-06-15 NOTE — Progress Notes (Incomplete)
Assessment: 1. Urinary tract infection without hematuria, site unspecified - Urinalysis, Routine w reflex microscopic - Urine Culture  2. BPH with urinary obstruction    Plan: Urine cx ordered through his assisted living facility. Start Macrobid and hold hs Keflex until current tx is complete.  If the patient's culture is positive, will treat acutely and resume at bedtime dosing pending sensitivities.  FU in 2-3 months for recheck UA.   Meds ordered this encounter  Medications   nitrofurantoin, macrocrystal-monohydrate, (MACROBID) 100 MG capsule    Sig: Take 1 capsule (100 mg total) by mouth 2 (two) times daily for 7 days.    Dispense:  14 capsule    Refill:  0     Chief Complaint: No chief complaint on file.   HPI: Marc Jimenez is a 71 y.o. male from assisted living with history of UTI, BPH with obstruction, incomplete emptying, and elevated PSA who presents for continued evaluation of recurrent UTI.  Patient reports doing well since at bedtime Keflex prescribed although he continues to have occasional urgency and frequency.  He denies burning, dysuria, gross hematuria.  Overall, the patient states he is satisfied with current treatment.  Patient continues prescriptions for finasteride, tamsulosin, and at bedtime Keflex.  Last urine culture on 05/07/2022, no growth.  Last PSA was on 10/02/2021  UA= 21-50 WBCs, many bacteria, nitrite negative    05/07/22: Marc Jimenez returns today in f/u for his history of and elevated PSA and recurrent UTI's.  His PVR today is 12m.  He was seen in the ER on 03/17/22 for AMS and fevere with leukocytosis and was found to have an e. Coli UTI.  Had a CT and renal UKoreathat showed no stones or obstruction but there was a mildly complex RUP renal cyst 1.6cm in size.  His prostate has a transverse diameter of 5cm.  He remains on tamsulosin for BPH with BOO.  He is on Cefuroxime at this time for another e. Coli UTI on culture on 05/04/22.  He has some  dysuria.  His IPSS is 27 with nocturia x 4.   UA has 6-10 WBC and few bacteria.    04/10/21: [name] today in f/u for his history of an elevated PSA.  The PSA is down to 6.0 with an 8.2% f/t ratio on 04/03/21.   He is on tamsulosin for BPH with BOO.  He had e. Coli on his culture on 01/09/21 and was treated with bactrim and his urine is clear today.   10/09/21: [name] today in f/u.  His PSA prior to this visit is 6.2 with a 9.5% f/t ratio.  This is stable over the past year.  He had a CT in September that showed prostate enlargement and right renal cysts.  He remains on tamsulosin.   He has a history of UTI's.  He has no new voiding complaints.  He has no hematuria.  His UA is clear.       Gu hx: Marc Jimenez a 71yo who is sent for a PSA of 7.1 on labs from 10/11/20.  [place] for BPH with BOO and was given tamsulosin but failed to f/u.   He remains on tamsulosin.   He has a long history of alcoholism and was not felt to be a good candidate for PSA screening at that time.   He is currently in assisted living facility.  He had the PSA checked because  of LUTS.  He has some frequency and urgency and UUI.  He has some hesitancy with an intermittent stream.   He has an adequate stream.  He has had no hematuria or dysuria.  He has had dark urine with some odor.  His UA today has Pyuria and bacteriuria.   He had a clear urine on 11/06/20 at [facility].  His Cr was 1.07.  He was seen for a sigmoid volvulus at that time.  On the CT his prostate was large but he wasn't in retention.  He didn't have a foley.   PVR today is 21m. Portions of the above documentation were copied from a prior visit for review purposes only.  Allergies: No Known Allergies  PMH: Past Medical History:  Diagnosis Date   Alcohol abuse    Arrhythmia    Cataplexy    Glaucoma    Heart disease    Hypertension    Narcolepsy    Narcolepsy     PSH: Past Surgical History:  Procedure Laterality Date    FLEXIBLE SIGMOIDOSCOPY N/A 02/12/2021   Procedure: FLEXIBLE SIGMOIDOSCOPY;  Surgeon: CHarvel Quale MD;  Location: [facility];  Service: Gastroenterology;  Laterality: N/A;   HERNIA REPAIR     PARTIAL COLECTOMY N/A 02/14/2021   Procedure: PARTIAL COLECTOMY;  Surgeon: JAviva Signs MD;  Location: [facility];  Service: General;  Laterality: N/A;    SH: Social History   Tobacco Use   Smoking status: Every Day    Packs/day: 1.00    Years: 30.00    Total pack years: 30.00    Types: Cigarettes   Smokeless tobacco: Never  Vaping Use   Vaping Use: Never used  Substance Use Topics   Alcohol use: Yes    Alcohol/week: 21.0 standard drinks of alcohol    Types: 21 Cans of beer per week    Comment: 12 pack of beer daily.    Drug use: Yes    Types: Marijuana    ROS: All other review of systems were reviewed and are negative except what is noted above in HPI  PE: BP 127/84   Pulse (!) 105   Ht '5\' 7"'$  (1.702 m)   Wt 139 lb (63 kg)   BMI 21.77 kg/m  GENERAL APPEARANCE:  Well appearing, well developed, well nourished, NAD HEENT:  Atraumatic, normocephalic NECK:  Supple. Trachea midline ABDOMEN:  Soft, non-tender, no masses EXTREMITIES:  Moves all extremities well, without clubbing, cyanosis, or edema NEUROLOGIC:  Alert and oriented x 3, normal gait, CN II-XII grossly intact MENTAL STATUS:  appropriate BACK:  Non-tender to palpation, No CVAT SKIN:  Warm, dry, and intact   Results: Laboratory Data: Lab Results  Component Value Date   WBC 5.8 03/21/2022   HGB 13.2 03/21/2022   HCT 39.2 03/21/2022   MCV 88.7 03/21/2022   PLT 214 03/21/2022    Lab Results  Component Value Date   CREATININE 0.94 03/21/2022    No results found for: "PSA"  No results found for: "TESTOSTERONE"  No results found for: "HGBA1C"  Urinalysis    Component Value Date/Time   COLORURINE YELLOW 03/17/2022 1335   APPEARANCEUR HAZY (A) 03/17/2022 1335   APPEARANCEUR Clear 10/09/2021  1614   LABSPEC 1.020 03/17/2022 1335   PHURINE 7.0 03/17/2022 1335   GLUCOSEU NEGATIVE 03/17/2022 1335   HGBUR SMALL (A) 03/17/2022 1335   BILIRUBINUR NEGATIVE 03/17/2022 1335   BILIRUBINUR Negative 10/09/2021 1614   KETONESUR 5 (A) 03/17/2022 1335   PROTEINUR 30 (  A) 03/17/2022 1335   NITRITE POSITIVE (A) 03/17/2022 1335   LEUKOCYTESUR SMALL (A) 03/17/2022 1335    Lab Results  Component Value Date   LABMICR Comment 10/09/2021   WBCUA 11-30 (A) 01/09/2021   LABEPIT 0-10 01/09/2021   BACTERIA MANY (A) 03/17/2022    Pertinent Imaging: Results for orders placed during the hospital encounter of 02/10/21  DG Abd 1 View  Narrative CLINICAL DATA:  Volvulus status post sigmoidoscopy.  EXAM: ABDOMEN - 1 VIEW  COMPARISON:  CT of February 10, 2021.  Radiograph of November 07, 2020.  FINDINGS: Large amount of contrast and stool is seen in what appears to be the right colon. No significant bowel dilatation is noted currently. No abnormal calcifications are noted.  IMPRESSION: No significant abnormal bowel dilatation is seen currently.   Electronically Signed By: Marijo Conception M.D. On: 02/12/2021 14:53  No results found for this or any previous visit.  No results found for this or any previous visit.  No results found for this or any previous visit.  Results for orders placed during the hospital encounter of 03/17/22  US RENAL  Narrative CLINICAL DATA:  Right renal lesion follow-up.  EXAM: RENAL / URINARY TRACT ULTRASOUND COMPLETE  COMPARISON:  CT abdomen pelvis from yesterday.  FINDINGS: Right Kidney:  Renal measurements: 11.1 x 4.5 x 5.8 cm = volume: 152 mL. Echogenicity within normal limits. No mass or hydronephrosis visualized. 3.9 cm simple cyst in the lower pole. No follow-up imaging is recommended. 1.6 cm homogeneously hypoechoic lesion with posterior acoustic enhancement and no internal vascularity in the upper pole, consistent with complex cyst.  Left  Kidney:  Renal measurements: 9.8 x 6.5 x 6.0 cm = volume: 198 mL. Echogenicity within normal limits. No mass or hydronephrosis visualized.  Bladder:  Appears normal for degree of bladder distention.  Other:  None.  IMPRESSION: 1. 1.6 cm right upper pole renal lesion seen on CT corresponds to a complex cyst. No follow-up imaging is recommended.   Electronically Signed By: Titus Dubin M.D. On: 03/20/2022 10:10  No valid procedures specified. No results found for this or any previous visit.  No results found for this or any previous visit.  No results found for this or any previous visit (from the past 24 hour(s)).

## 2022-06-16 ENCOUNTER — Other Ambulatory Visit: Payer: Self-pay

## 2022-06-16 ENCOUNTER — Telehealth: Payer: Self-pay

## 2022-06-16 DIAGNOSIS — M79674 Pain in right toe(s): Secondary | ICD-10-CM | POA: Diagnosis not present

## 2022-06-16 DIAGNOSIS — B351 Tinea unguium: Secondary | ICD-10-CM | POA: Diagnosis not present

## 2022-06-16 DIAGNOSIS — M79675 Pain in left toe(s): Secondary | ICD-10-CM | POA: Diagnosis not present

## 2022-06-16 MED ORDER — NITROFURANTOIN MONOHYD MACRO 100 MG PO CAPS: 100.0000 mg | ORAL_CAPSULE | Freq: Two times a day (BID) | ORAL | 0 refills | Status: DC

## 2022-06-16 MED ORDER — NITROFURANTOIN MONOHYD MACRO 100 MG PO CAPS: 100.0000 mg | ORAL_CAPSULE | Freq: Two times a day (BID) | ORAL | 0 refills | Status: AC

## 2022-06-16 NOTE — Telephone Encounter (Signed)
-----   Message from [place], [place] sent at 06/16/2022  3:01 PM EDT ----- Marc Jimenez,   Please let pt know he needs to come by and drop another urine as his specimen yesterday did not go out for a culture. Once he has dropped a urine, he needs to start Tx for Macrobid that I sent to his pharmacy. He needs to hold HS Keflex until this UTI treatment is complete

## 2022-06-16 NOTE — Telephone Encounter (Signed)
Spoke to someone at the facility, they are aware to send urine for culture.  They are requesting orders to d/c keflex rx as well as a copy of the Macrobid orders.  These can be faxed to [phone].  I resent the Macrobid to [facility] in [place], that is the pharmacy the facility uses.

## 2022-06-16 NOTE — Addendum Note (Signed)
Addended by: Simonne Come A on: 06/16/2022 03:31 PM   Modules accepted: Orders

## 2022-06-17 ENCOUNTER — Other Ambulatory Visit: Payer: Medicare Other

## 2022-06-17 ENCOUNTER — Telehealth: Payer: Self-pay

## 2022-06-18 ENCOUNTER — Other Ambulatory Visit: Payer: Medicare Other

## 2022-06-18 ENCOUNTER — Other Ambulatory Visit: Payer: Self-pay

## 2022-06-18 DIAGNOSIS — N390 Urinary tract infection, site not specified: Secondary | ICD-10-CM

## 2022-06-18 NOTE — Telephone Encounter (Signed)
I called to f/u on the status of the patient's specimen since we never received urine drop off.  The facility states they are having trouble collecting the sample and they do not have a driver after 3 to bring the specimen.  I informed them we do close early tomorrow but they agreed to try to have sample here by 12.  If unable to obtain specimen they will call and set up an apt with provider.  Patient on lab schedule tomorrow.

## 2022-06-18 NOTE — Telephone Encounter (Signed)
Marc Jimenez called from [facility] facility with questions in regards to the orders Marc Jimenez had sent over.  I returned the call and spoke to [name] and she informed me they cannot send out urine cultures as an assisted living facility.  She did say they could collect the urine and bring back to the lab here in office.  They were also requesting order for the Keflex to be d/c.  Orders faxed to [phone] as requested and confirmed they could bring specimen here for [place] to send for culture.

## 2022-06-19 ENCOUNTER — Other Ambulatory Visit: Payer: Medicare Other

## 2022-06-19 DIAGNOSIS — N390 Urinary tract infection, site not specified: Secondary | ICD-10-CM | POA: Diagnosis not present

## 2022-06-23 LAB — URINE CULTURE

## 2022-06-25 ENCOUNTER — Telehealth: Payer: Self-pay

## 2022-06-25 MED ORDER — CEFDINIR 300 MG PO CAPS: 300.0000 mg | ORAL_CAPSULE | Freq: Two times a day (BID) | ORAL | 0 refills | Status: DC

## 2022-06-25 NOTE — Telephone Encounter (Signed)
-----   Message from Reynaldo Minium, [place] sent at 06/23/2022  4:49 PM EDT ----- Sending this to you bc you already know his situation. Cx indicates resistant bacteria. Pt needs to change his current antibx to [facility]. Please confirm where I need to send the Rx and whether we need to send orders again. Thanks tons!!  ----- Message ----- From: Interface, [facility] Lab Results In Sent: 06/23/2022   3:36 PM EDT To: Reynaldo Minium, PA-C

## 2022-06-25 NOTE — Telephone Encounter (Signed)
Spoke to [place] a [facility] facility and informed her of Jill's response to pt's culture.  Verbal from [place] to send in '300mg'$  Omnicef qty 51.  Rx sent to pharmacy and facility is aware pt is to continue to hold daily abx until finishing omnicef.

## 2022-08-18 DIAGNOSIS — B351 Tinea unguium: Secondary | ICD-10-CM | POA: Diagnosis not present

## 2022-08-18 DIAGNOSIS — M79675 Pain in left toe(s): Secondary | ICD-10-CM | POA: Diagnosis not present

## 2022-08-18 DIAGNOSIS — M79674 Pain in right toe(s): Secondary | ICD-10-CM | POA: Diagnosis not present

## 2022-08-27 ENCOUNTER — Telehealth: Payer: Self-pay

## 2022-08-27 ENCOUNTER — Ambulatory Visit (INDEPENDENT_AMBULATORY_CARE_PROVIDER_SITE_OTHER): Payer: Medicare Other | Admitting: Urology

## 2022-08-27 VITALS — BP 108/75 | HR 80

## 2022-08-27 DIAGNOSIS — N401 Enlarged prostate with lower urinary tract symptoms: Secondary | ICD-10-CM

## 2022-08-27 DIAGNOSIS — R3915 Urgency of urination: Secondary | ICD-10-CM

## 2022-08-27 DIAGNOSIS — N411 Chronic prostatitis: Secondary | ICD-10-CM

## 2022-08-27 DIAGNOSIS — Z87440 Personal history of urinary (tract) infections: Secondary | ICD-10-CM | POA: Diagnosis not present

## 2022-08-27 DIAGNOSIS — N3281 Overactive bladder: Secondary | ICD-10-CM | POA: Diagnosis not present

## 2022-08-27 DIAGNOSIS — R339 Retention of urine, unspecified: Secondary | ICD-10-CM

## 2022-08-27 DIAGNOSIS — N138 Other obstructive and reflux uropathy: Secondary | ICD-10-CM

## 2022-08-27 LAB — URINALYSIS, ROUTINE W REFLEX MICROSCOPIC
Bilirubin, UA: NEGATIVE
Glucose, UA: NEGATIVE
Ketones, UA: NEGATIVE
Leukocytes,UA: NEGATIVE
Nitrite, UA: NEGATIVE
Protein,UA: NEGATIVE
RBC, UA: NEGATIVE
Specific Gravity, UA: 1.02 (ref 1.005–1.030)
Urobilinogen, Ur: 0.2 mg/dL (ref 0.2–1.0)
pH, UA: 5 (ref 5.0–7.5)

## 2022-08-27 NOTE — Progress Notes (Signed)
Subjective: 1. BPH with urinary obstruction   2. Incomplete bladder emptying   3. Chronic prostatitis   4. Urgency of urination    08/27/22: Marc Jimenez returns today in f/u.  He was seen in 06/15/22 for possible UTI.   He was given Macrobid at that visit for 7 days but he grew providencia that was resistant and was changed to Cefdinir on 06/25/22.  He remains on keflex as a suppressive antibiotic.   He has BPH with BOO and is on tamsulosin and finasteride. His UA is clear today.    05/07/22: Marc Jimenez returns today in f/u for his history of and elevated PSA and recurrent UTI's.  His PVR today is 112m.  He was seen in the ER on 03/17/22 for AMS and fevere with leukocytosis and was found to have an e. Coli UTI.  Had a CT and renal UKoreathat showed no stones or obstruction but there was a mildly complex RUP renal cyst 1.6cm in size.  His prostate has a transverse diameter of 5cm.  He remains on tamsulosin for BPH with BOO.  He is on Cefuroxime at this time for another e. Coli UTI on culture on 05/04/22.  He has some dysuria.  His IPSS is 27 with nocturia x 4.   UA has 6-10 WBC and few bacteria.   04/10/21: Mr. MQuastreturns today in f/u for his history of an elevated PSA.  The PSA is down to 6.0 with an 8.2% f/t ratio on 04/03/21.   He is on tamsulosin for BPH with BOO.  He had e. Coli on his culture on 01/09/21 and was treated with bactrim and his urine is clear today.   10/09/21: Mr. MEnisreturns today in f/u.  His PSA prior to this visit is 6.2 with a 9.5% f/t ratio.  This is stable over the past year.  He had a CT in September that showed prostate enlargement and right renal cysts.  He remains on tamsulosin.   He has a history of UTI's.  He has no new voiding complaints.  He has no hematuria.  His UA is clear.     Gu hx: Mr. MRicciardiis a 71yo who is sent for a PSA of 7.1 on labs from 10/11/20.  [place] with BOO and was given tamsulosin but failed to f/u.   He remains on  tamsulosin.   He has a long history of alcoholism and was not felt to be a good candidate for PSA screening at that time.   He is currently in assisted living facility.  He had the PSA checked because of LUTS.  He has some frequency and urgency and UUI.  He has some hesitancy with an intermittent stream.   He has an adequate stream.  He has had no hematuria or dysuria.  He has had dark urine with some odor.  His UA today has Pyuria and bacteriuria.   [facility]  [facility] was 1.07.  He was seen for a sigmoid volvulus at that time.  On the CT his prostate was large but he wasn't in retention.  He didn't have a foley.   PVR today is 453m   IPSS     Row Name 08/27/22 1000         International Prostate Symptom Score   How often have you had the sensation of not emptying your bladder? Less than 1  in 5     How often have you had to urinate less than every two hours? Less than half the time     How often have you found you stopped and started again several times when you urinated? Less than half the time     How often have you found it difficult to postpone urination? More than half the time     How often have you had a weak urinary stream? Less than half the time     How often have you had to strain to start urination? Almost always     How many times did you typically get up at night to urinate? 3 Times     Total IPSS Score 19       Quality of Life due to urinary symptoms   If you were to spend the rest of your life with your urinary condition just the way it is now how would you feel about that? Mostly Satisfied               ROS:  ROS  No Known Allergies  Past Medical History:  Diagnosis Date   Alcohol abuse    Arrhythmia    Cataplexy    Glaucoma    Heart disease    Hypertension    Narcolepsy    Narcolepsy     Past Surgical History:  Procedure Laterality Date   FLEXIBLE SIGMOIDOSCOPY N/A 02/12/2021   Procedure: FLEXIBLE SIGMOIDOSCOPY;  Surgeon:  Harvel Quale, MD;  [facility];  Service: Gastroenterology;  Laterality: N/A;   HERNIA REPAIR     PARTIAL COLECTOMY N/A 02/14/2021   Procedure: PARTIAL COLECTOMY;  Surgeon: Aviva Signs, MD;  Location: [facility];  Service: General;  Laterality: N/A;    Social History   Socioeconomic History   Marital status: Legally Separated    Spouse name: Not on file   Number of children: Not on file   Years of education: Not on file   Highest education level: Not on file  Occupational History   Not on file  Tobacco Use   Smoking status: Every Day    Packs/day: 1.00    Years: 30.00    Total pack years: 30.00    Types: Cigarettes   Smokeless tobacco: Never  Vaping Use   Vaping Use: Never used  Substance and Sexual Activity   Alcohol use: Yes    Alcohol/week: 21.0 standard drinks of alcohol    Types: 21 Cans of beer per week    Comment: 12 pack of beer daily.    Drug use: Yes    Types: Marijuana   Sexual activity: Not on file  Other Topics Concern   Not on file  Social History Narrative   Not on file   Social Determinants of Health   Financial Resource Strain: Not on file  Food Insecurity: Not on file  Transportation Needs: Not on file  Physical Activity: Not on file  Stress: Not on file  Social Connections: Not on file  Intimate Partner Violence: Not on file    Family History  Family history unknown: Yes    Anti-infectives: Anti-infectives (From admission, onward)    None       Current Outpatient Medications  Medication Sig Dispense Refill   amLODipine (NORVASC) 5 MG tablet Take 1 tablet by mouth daily.     apixaban (ELIQUIS) 5 MG TABS tablet Take 1 tablet (5 mg total) by mouth 2 (two) times daily. 60 tablet 0  cephALEXin (KEFLEX) 250 MG capsule Take 1 capsule (250 mg total) by mouth at bedtime. 90 capsule 1   COMBIGAN 0.2-0.5 % ophthalmic solution Place 1 drop into both eyes every 12 (twelve) hours.     finasteride (PROSCAR) 5 MG tablet Take  1 tablet (5 mg total) by mouth daily. 90 tablet 3   folic acid (FOLVITE) 1 MG tablet Take 1 tablet (1 mg total) by mouth daily. 30 tablet 1   furosemide (LASIX) 20 MG tablet Take 10 mg by mouth daily.     LUMIGAN 0.01 % SOLN Place 1 drop into both eyes at bedtime.     QUEtiapine (SEROQUEL) 25 MG tablet Take 12.5 mg by mouth at bedtime.     rosuvastatin (CRESTOR) 10 MG tablet Take 10 mg by mouth at bedtime.     tamsulosin (FLOMAX) 0.4 MG CAPS capsule Take 0.4 mg by mouth daily.     thiamine (VITAMIN B-1) 100 MG tablet Take 100 mg by mouth daily.     tiZANidine (ZANAFLEX) 2 MG tablet Take 2 mg by mouth at bedtime.     No current facility-administered medications for this visit.     Objective: Vital signs in last 24 hours: BP 108/75   Pulse 80   Intake/Output from previous day: No intake/output data recorded. Intake/Output this shift: '@IOTHISSHIFT'$ @   Physical Exam  Lab Results:  Recent Results (from the past 2160 hour(s))  Urinalysis, Routine w reflex microscopic     Status: None   Collection Time: 06/15/22  9:59 AM  Result Value Ref Range   Specific Gravity, UA 1.015 1.005 - 1.030   pH, UA 5.0 5.0 - 7.5   Color, UA Yellow Yellow   Appearance Ur Clear Clear   Leukocytes,UA Negative Negative   Protein,UA Negative Negative/Trace   Glucose, UA Negative Negative   Ketones, UA Negative Negative   RBC, UA Negative Negative   Bilirubin, UA Negative Negative   Urobilinogen, Ur 0.2 0.2 - 1.0 mg/dL   Nitrite, UA Negative Negative   Microscopic Examination Comment     Comment: Microscopic not indicated and not performed.  Urine Culture     Status: Abnormal   Collection Time: 06/19/22 12:00 PM   Specimen: Urine   UR  Result Value Ref Range   Urine Culture, Routine Final report (A)    Organism ID, Bacteria Providencia rettgeri (A)     Comment: 50,000-100,000 colony forming units per mL   Antimicrobial Susceptibility Comment     Comment:       ** S = Susceptible; I =  Intermediate; R = Resistant **                    P = Positive; N = Negative             MICS are expressed in micrograms per mL    Antibiotic                 RSLT#1    RSLT#2    RSLT#3    RSLT#4 Ampicillin                     R Cefazolin                      R Cefepime                       S Ceftriaxone  S Cefuroxime                     S Ciprofloxacin                  S Gentamicin                     S Imipenem                       S Levofloxacin                   S Meropenem                      S Nitrofurantoin                 R Piperacillin/Tazobactam        S Tetracycline                   R Tobramycin                     S   Urinalysis, Routine w reflex microscopic     Status: None   Collection Time: 08/27/22 10:48 AM  Result Value Ref Range   Specific Gravity, UA 1.020 1.005 - 1.030   pH, UA 5.0 5.0 - 7.5   Color, UA Yellow Yellow   Appearance Ur Clear Clear   Leukocytes,UA Negative Negative   Protein,UA Negative Negative/Trace   Glucose, UA Negative Negative   Ketones, UA Negative Negative   RBC, UA Negative Negative   Bilirubin, UA Negative Negative   Urobilinogen, Ur 0.2 0.2 - 1.0 mg/dL   Nitrite, UA Negative Negative   Microscopic Examination Comment     Comment: Microscopic not indicated and not performed.      BMET No results for input(s): "NA", "K", "CL", "CO2", "GLUCOSE", "BUN", "CREATININE", "CALCIUM" in the last 72 hours. PT/INR No results for input(s): "LABPROT", "INR" in the last 72 hours. ABG No results for input(s): "PHART", "HCO3" in the last 72 hours.  Invalid input(s): "PCO2", "PO2" UA has pyuria and many bacteria but is nit-.  Studies/Results: No results found.    Assessment/Plan: Recurrent UTI's.  He had providencia in October.  He was treated with cefdinir.  He is back on nightly keflex and his UA is clear.   BPH with BOO and OAB.  He will continue the tamsulosin and  finasteride.   He is better with the addition  of finasteride.      No orders of the defined types were placed in this encounter.     Orders Placed This Encounter  Procedures   Urinalysis, Routine w reflex microscopic     Return in about 6 months (around 02/26/2023).    CC: Dr. Stoney Bang.      Irine Seal 08/28/[phone] ID: Marc Jimenez, male   DOB: 1951/02/02, 71 y.o.   MRN: [id number]

## 2022-08-27 NOTE — Telephone Encounter (Signed)
Return call to [place], he voice he would like information from today's visit about his uncle. Made Charles aware there is no an DPR in his uncle chart and won't be able to discuss today's visit. Made Charles aware to come by the office with his uncle to get a DPR on file. Charles voiced understanding

## 2022-08-28 ENCOUNTER — Encounter: Payer: Self-pay | Admitting: Urology

## 2022-09-16 IMAGING — CT CT ABD-PELV W/ CM
2 of 5 series · 16 of 46 positions shown, 18 images · IV contrast (Omnipaque or Isovue)
Comparison: February 10, 2021

CLINICAL DATA: Diarrhea.

EXAM:
CT ABDOMEN AND PELVIS WITH CONTRAST
TECHNIQUE: Multidetector CT imaging of the abdomen and pelvis was performed
using the standard protocol following bolus administration of
intravenous contrast.
CONTRAST:  100mL OMNIPAQUE IOHEXOL 300 MG/ML  SOLN

[Series 2: axial st · axial · 0.70mm/px · z∈[+940,+1315]mm · 13 of 87 slices shown, 15 images]
[im 6/87  soft-tissue]
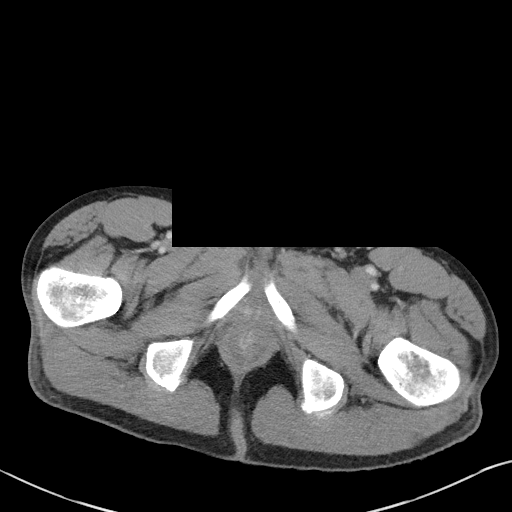
[im 6/87  bone]
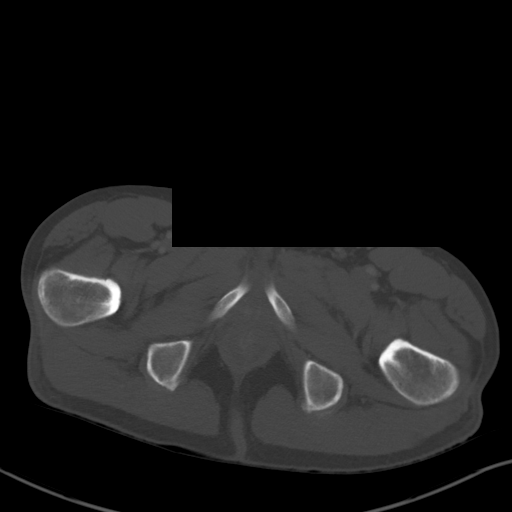
[im 11/87  soft-tissue]
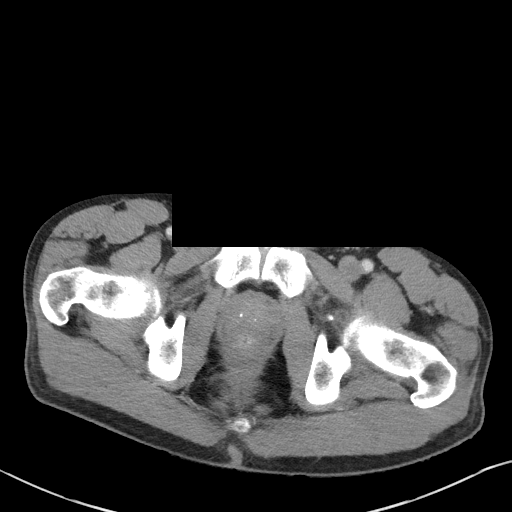
[im 17/87  soft-tissue]
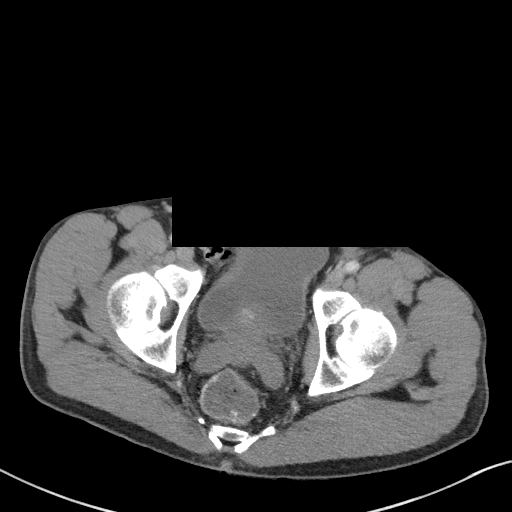
[im 27/87  soft-tissue]
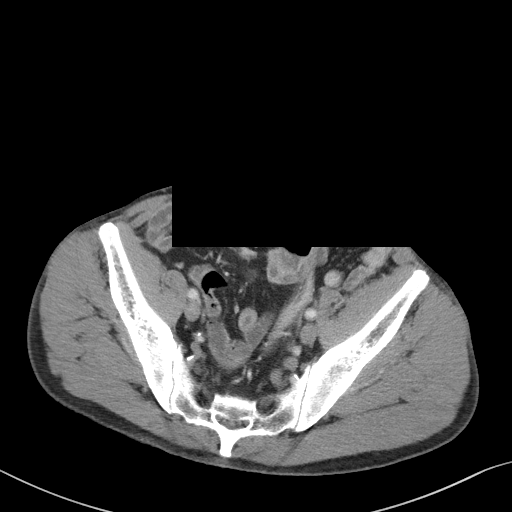
[im 33/87  soft-tissue]
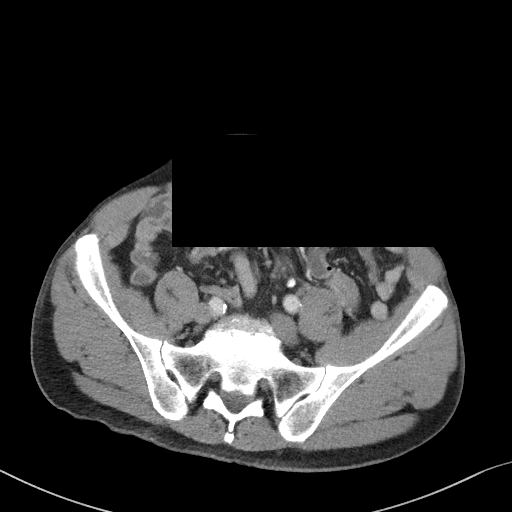
[im 38/87  soft-tissue]
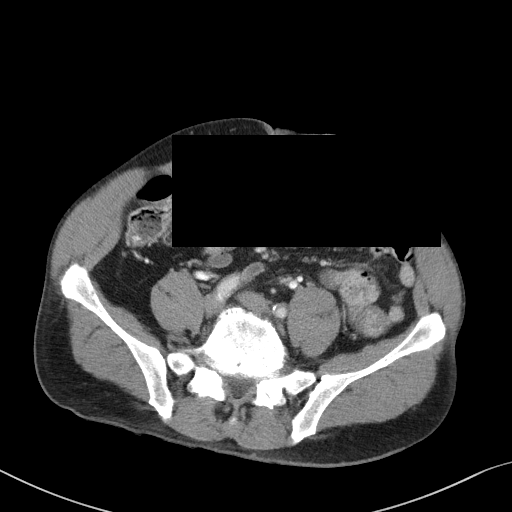
[im 44/87  soft-tissue]
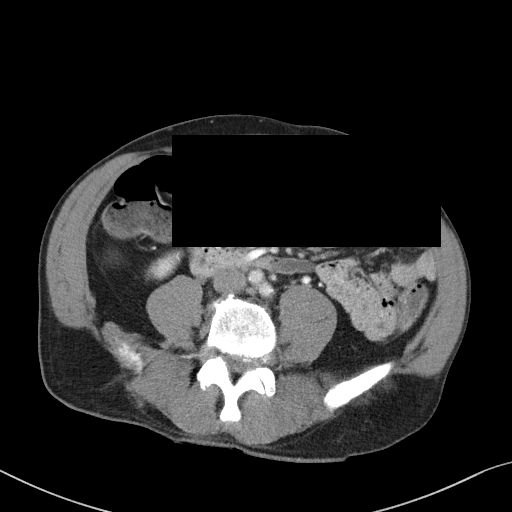
[im 49/87  soft-tissue]
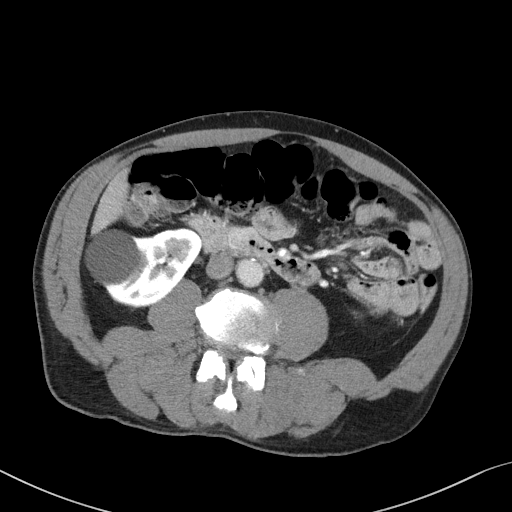
[im 54/87  soft-tissue]
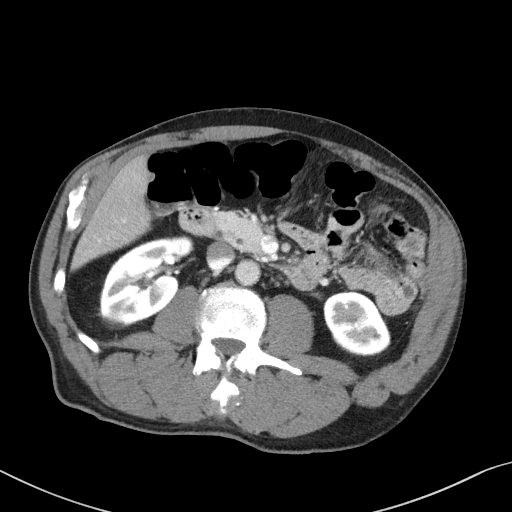
[im 54/87  bone]
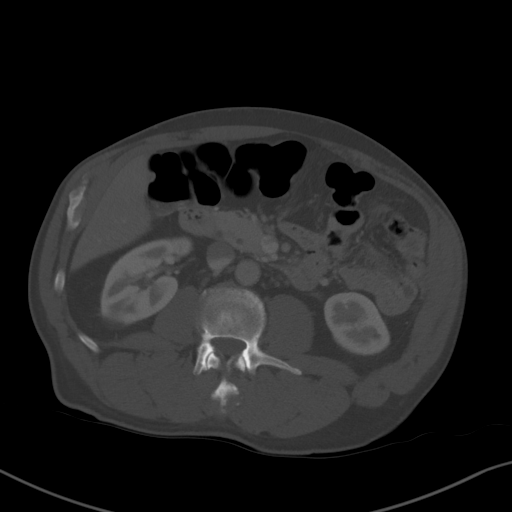
[im 60/87  soft-tissue]
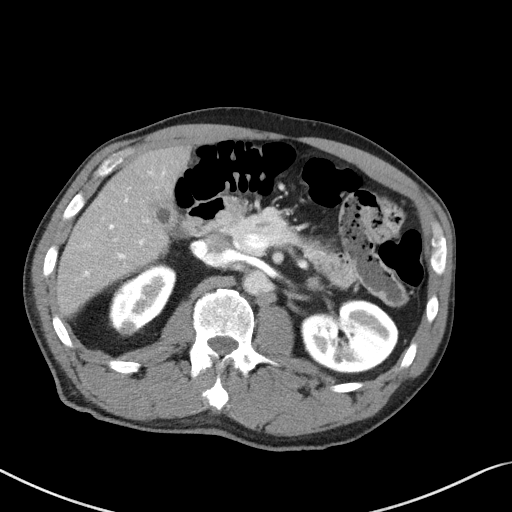
[im 70/87  soft-tissue]
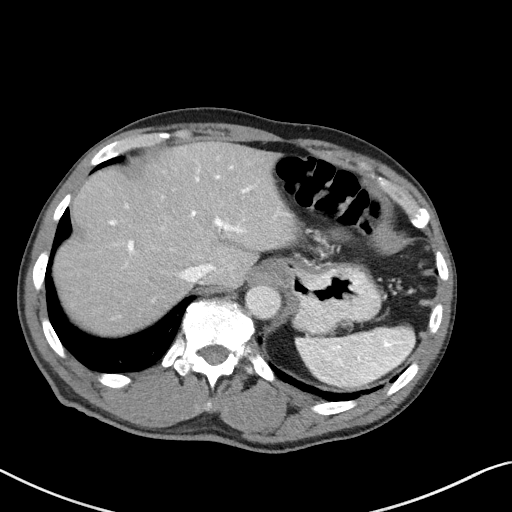
[im 76/87  soft-tissue]
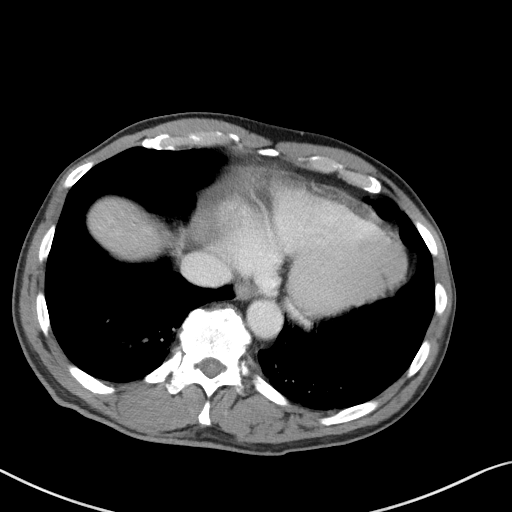
[im 81/87  soft-tissue]
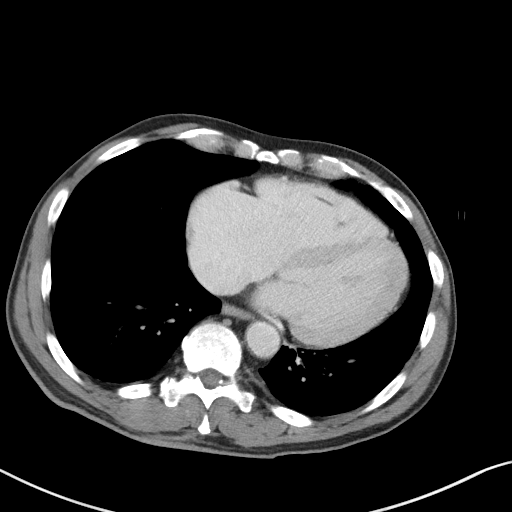

[Series 4: coronal st · coronal · 0.76mm/px · 3 of 78 slices shown]
[im 26/78  soft-tissue]
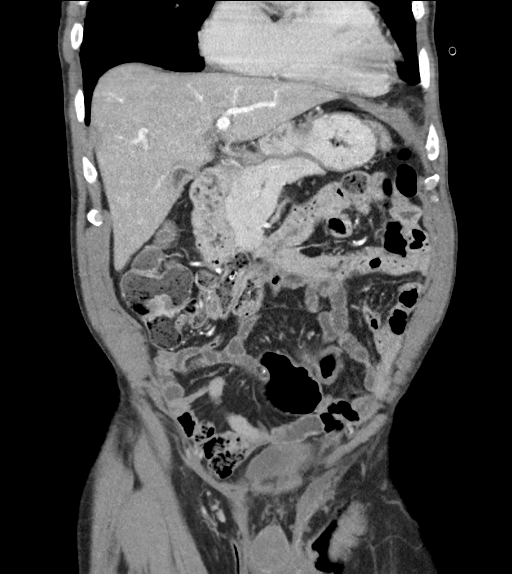
[im 35/78  soft-tissue]
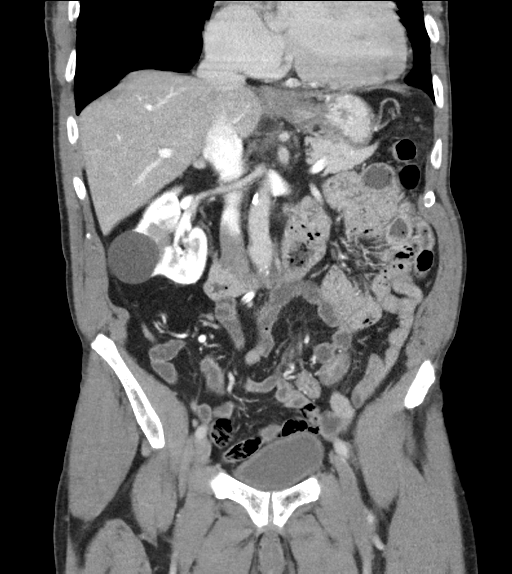
[im 43/78  soft-tissue]
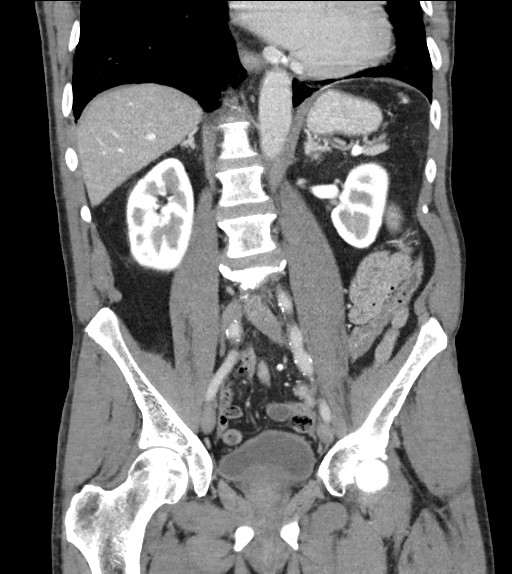

[16 of 46 positions shown; findings below may reference images not displayed]

FINDINGS: Lower chest: No acute abnormality.

Hepatobiliary: There is diffuse fatty infiltration of the liver
parenchyma. No focal liver abnormality is seen. No gallstones,
gallbladder wall thickening, or biliary dilatation.

Pancreas: Unremarkable. No pancreatic ductal dilatation or
surrounding inflammatory changes.

Spleen: Normal in size without focal abnormality.

Adrenals/Urinary Tract: The right adrenal gland is normal in size
and appearance. A 2.0 cm x 1.5 cm isodense (approximately
Hounsfield units) left adrenal mass is noted. Kidneys are normal in
size, without renal calculi or hydronephrosis. Multiple stable
bilateral simple renal cysts are seen. Bladder is unremarkable.

Stomach/Bowel: Diffuse gastric wall thickening is seen with a mild
amount of fluid noted along the gastric wall. Appendix appears
normal. Surgically anastomosed bowel is seen along the proximal to
mid sigmoid colon. No evidence of bowel dilatation. Very mild
thickening of the distal sigmoid colon is seen. This segment of
sigmoid colon is also poorly distended

Vascular/Lymphatic: Aortic atherosclerosis. No enlarged abdominal or
pelvic lymph nodes.

Reproductive: There is moderate to marked severity prostate gland
enlargement.

Other: A small, stable umbilical hernia is seen. No abdominopelvic
ascites.

Musculoskeletal: Degenerative changes seen throughout the lumbar
spine, most notably at the level of L5-S1.
IMPRESSION: 1. Findings suggestive of moderate severity gastritis.
2. Hepatic steatosis.
3. Stable bilateral simple renal cysts.
4. Stable left adrenal mass which may represent an adrenal adenoma.
5. Small, stable umbilical hernia.
6. Moderate to marked severity prostate gland enlargement.
7. Aortic atherosclerosis.

Aortic Atherosclerosis ([id number]).

## 2022-10-01 ENCOUNTER — Other Ambulatory Visit: Payer: Medicare Other

## 2022-10-05 DIAGNOSIS — H25813 Combined forms of age-related cataract, bilateral: Secondary | ICD-10-CM | POA: Diagnosis not present

## 2022-10-08 ENCOUNTER — Ambulatory Visit: Payer: Medicare Other | Admitting: Urology

## 2022-10-28 NOTE — Progress Notes (Unsigned)
Cardiology Office Note   Date:  10/29/2022   ID:  Marc Jimenez, DOB 17-Nov-1950, MRN [id number]  PCP:  [name], MD  Cardiologist:  Dr. Dellia Cloud      Chief Complaint  Patient presents with   Atrial Flutter      History of Present Illness: Marc Jimenez is a 72 y.o. male who presents for 6 month follow up last seen 03/30/22 with Dr. Johney Frame.   hx of HTN, alcohol abuse, tobacco abuse, and Aflutter who was referred by Dr. Sherrie Sport for further evaluation   hospitalization in 03/2022 for acute metabolic encephalopathy found to have E.Coli UTI. Has known history of Aflutter and was previously determined to be too high risk for [facility] due to noncompliance. Now that he resides at a facility where medications are administered to him, he was deemed to be a better candidate and started on apixaban.    Was stable in July HR and BP controlled. With no nodal agents.    Today his only complaint is not seeing well.  He needs help to ambulate, seeing ophthalmologist through his skilled facility.  His aid is with him today.   Occ brief chest pain. No racing heart beat he is aware of.  No syncope  --he walks to dining room about 50 yards at least three times per day without significant issues. No chest pain with walking.  No SOB or chest pain.  His chest pain is brief.     Past Medical History:  Diagnosis Date   Alcohol abuse    Arrhythmia    Cataplexy    Glaucoma    Heart disease    Hypertension    Narcolepsy    Narcolepsy     Past Surgical History:  Procedure Laterality Date   FLEXIBLE SIGMOIDOSCOPY N/A 02/12/2021   Procedure: FLEXIBLE SIGMOIDOSCOPY;  Surgeon: Harvel Quale, MD;  Location[facility];  Service: Gastroenterology;  Laterality: N/A;   HERNIA REPAIR     PARTIAL COLECTOMY N/A 02/14/2021   Procedure: PARTIAL COLECTOMY;  Surgeon: Aviva Signs, MD;  Location: [facility];  Service: General;  Laterality: N/A;     Current Outpatient Medications   Medication Sig Dispense Refill   amLODipine (NORVASC) 5 MG tablet Take 1 tablet by mouth daily.     apixaban (ELIQUIS) 5 MG TABS tablet Take 1 tablet (5 mg total) by mouth 2 (two) times daily. 60 tablet 0   cephALEXin (KEFLEX) 250 MG capsule Take 1 capsule (250 mg total) by mouth at bedtime. 90 capsule 1   COMBIGAN 0.2-0.5 % ophthalmic solution Place 1 drop into both eyes every 12 (twelve) hours.     finasteride (PROSCAR) 5 MG tablet Take 1 tablet (5 mg total) by mouth daily. 90 tablet 3   folic acid (FOLVITE) 1 MG tablet Take 1 tablet (1 mg total) by mouth daily. 30 tablet 1   furosemide (LASIX) 20 MG tablet Take 10 mg by mouth daily.     LUMIGAN 0.01 % SOLN Place 1 drop into both eyes at bedtime.     QUEtiapine (SEROQUEL) 25 MG tablet Take 12.5 mg by mouth at bedtime.     rosuvastatin (CRESTOR) 10 MG tablet Take 10 mg by mouth at bedtime.     tamsulosin (FLOMAX) 0.4 MG CAPS capsule Take 0.4 mg by mouth daily.     thiamine (VITAMIN B-1) 100 MG tablet Take 100 mg by mouth daily.     tiZANidine (ZANAFLEX) 2 MG tablet Take 2 mg  by mouth at bedtime.     No current facility-administered medications for this visit.    Allergies:   Patient has no known allergies.    Social History:  The patient  reports that he has been smoking cigarettes. He has a 30.00 pack-year smoking history. He has never used smokeless tobacco. He reports current alcohol use of about 21.0 standard drinks of alcohol per week. He reports current drug use. Drug: Marijuana.   Family History:  The patient's Family history is unknown by patient.    ROS:  General:no colds or fevers, no weight changes Skin:no rashes or ulcers HEENT:not seeing well at all, no congestion CV:see HPI PUL:see HPI GI:no diarrhea constipation or melena, no indigestion GU:no hematuria, no dysuria MS:no joint pain, no claudication Neuro:no syncope, no lightheadedness Endo:no diabetes, no thyroid disease  Wt Readings from Last 3 Encounters:   10/29/22 143 lb 12.8 oz (65.2 kg)  06/15/22 139 lb (63 kg)  05/07/22 139 lb (63 kg)     PHYSICAL EXAM: VS:  BP 124/80   Pulse 96   Ht 5' 6"$  (1.676 m)   Wt 143 lb 12.8 oz (65.2 kg)   SpO2 97%   BMI 23.21 kg/m  , BMI Body mass index is 23.21 kg/m. General:Pleasant affect, NAD Skin:Warm and dry, brisk capillary refill HEENT:normocephalic, sclera clear, mucus membranes moist Neck:supple, no JVD, no bruits  Heart:S1S2 RRR without murmur, gallup, rub or click Lungs:clear without rales, rhonchi, or wheezes [id number], non tender, + BS, do not palpate liver spleen or masses Ext:no lower ext edema, 2+ pedal pulses, 2+ radial pulses Neuro:alert and oriented, MAE, follows commands, + facial symmetry    EKG:  EKG is ordered today. 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  He has pause of non conducted Marc Jimenez as well.  No flutter   Recent Labs: 03/19/2022: Magnesium 2.2 03/21/2022: ALT 29; BUN 12; Creatinine, Ser 0.94; Hemoglobin 13.2; Platelets 214; Potassium 3.9; Sodium 137    Lipid Panel No results found for: "CHOL", "TRIG", "HDL", "CHOLHDL", "VLDL", "LDLCALC", "LDLDIRECT"     Other studies Reviewed: Additional studies/ records that were reviewed today include: .  TTE 02/2021: IMPRESSIONS     1. Left ventricular ejection fraction, by estimation, is 60 to 65%. The  left ventricle has normal function. The left ventricle has no regional  wall motion abnormalities. There is moderate left ventricular hypertrophy.  Left ventricular diastolic  parameters are indeterminate.   2. Right ventricular systolic function is normal. The right ventricular  size is normal. There is normal pulmonary artery systolic pressure. The  estimated right ventricular systolic pressure is 123456 mmHg.   3. Left atrial size was mild to moderately dilated.   4. Right atrial size was mildly dilated.   5. The mitral valve is grossly normal. Mild mitral valve  regurgitation.   6. The aortic valve is tricuspid. Aortic valve regurgitation is mild.  Aortic regurgitation PHT measures 719 msec.   7. The inferior vena cava is normal in size with greater than 50%  respiratory variability, suggesting right atrial pressure of 3 mmHg.    ASSESSMENT AND PLAN:  1.  Atrial flutter has converted to SR still with RBBB --continue current meds eliquis on no nodal blocking agents.  He may go in and out.  So needs anticoagulation. No bleeding per pt and aid.   Labs from chart reviewed. Echo reviewed.   CHa2DS2VASc of 2-3   2.  HTN  well controlled continue amlodipine   3.  HLD continue Crestor would check H&L on next visit.   4.  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  Tobacco use, has stopped in SNF.     Current medicines are reviewed with the patient today.  The patient Has no concerns regarding medicines.  The following changes have been made:  See above Labs/ tests ordered today include:see above  Disposition:   FU:  see above  Signed, Cecilie Kicks, NP  10/29/2022 1:58 PM    [facility] [place], [address] [place], [place] Phone: [phone]; Fax: [phone]

## 2022-10-29 ENCOUNTER — Ambulatory Visit: Payer: Medicare Other | Attending: Cardiology | Admitting: Cardiology

## 2022-10-29 ENCOUNTER — Encounter: Payer: Self-pay | Admitting: Cardiology

## 2022-10-29 VITALS — BP 124/80 | HR 96 | Ht 66.0 in | Wt 143.8 lb

## 2022-10-29 DIAGNOSIS — Z720 Tobacco use: Secondary | ICD-10-CM | POA: Diagnosis not present

## 2022-10-29 DIAGNOSIS — I10 Essential (primary) hypertension: Secondary | ICD-10-CM | POA: Insufficient documentation

## 2022-10-29 DIAGNOSIS — E7800 Pure hypercholesterolemia, unspecified: Secondary | ICD-10-CM | POA: Insufficient documentation

## 2022-10-29 DIAGNOSIS — I483 Typical atrial flutter: Secondary | ICD-10-CM | POA: Insufficient documentation

## 2022-10-29 NOTE — Patient Instructions (Signed)
Medication Instructions:  Your physician recommends that you continue on your current medications as directed. Please refer to the Current Medication list given to you today.  *If you need a refill on your cardiac medications before your next appointment, please call your pharmacy*   Lab Work: None If you have labs (blood work) drawn today and your tests are completely normal, you will receive your results only by: [place] (if you have MyChart) OR A paper copy in the mail If you have any lab test that is abnormal or we need to change your treatment, we will call you to review the results.   Testing/Procedures: None   Follow-Up: At [facility], you and your health needs are our priority.  As part of our continuing mission to provide you with exceptional heart care, we have created designated Provider Care Teams.  These Care Teams include your primary Cardiologist (physician) and Advanced Practice Providers (APPs -  Physician Assistants and Nurse Practitioners) who all work together to provide you with the care you need, when you need it.  We recommend signing up for the patient portal called "MyChart".  Sign up information is provided on this After Visit Summary.  MyChart is used to connect with patients for Virtual Visits (Telemedicine).  Patients are able to view lab/test results, encounter notes, upcoming appointments, etc.  Non-urgent messages can be sent to your provider as well.   To learn more about what you can do with MyChart, go to [email or web address].    Your next appointment:   6 month(s)  Provider:   Claudina Lick, MD    Other Instructions  Thank you for choosing [facility]

## 2022-10-29 NOTE — Addendum Note (Signed)
Addended by: Barbarann Ehlers A on: 10/29/2022 04:15 PM   Modules accepted: Orders

## 2022-11-02 DIAGNOSIS — M79675 Pain in left toe(s): Secondary | ICD-10-CM | POA: Diagnosis not present

## 2022-11-02 DIAGNOSIS — B351 Tinea unguium: Secondary | ICD-10-CM | POA: Diagnosis not present

## 2022-11-02 DIAGNOSIS — M79674 Pain in right toe(s): Secondary | ICD-10-CM | POA: Diagnosis not present

## 2023-02-19 DIAGNOSIS — M79675 Pain in left toe(s): Secondary | ICD-10-CM | POA: Diagnosis not present

## 2023-02-19 DIAGNOSIS — M79674 Pain in right toe(s): Secondary | ICD-10-CM | POA: Diagnosis not present

## 2023-02-19 DIAGNOSIS — B351 Tinea unguium: Secondary | ICD-10-CM | POA: Diagnosis not present

## 2023-02-25 ENCOUNTER — Other Ambulatory Visit: Payer: Medicare Other

## 2023-02-25 DIAGNOSIS — N401 Enlarged prostate with lower urinary tract symptoms: Secondary | ICD-10-CM | POA: Diagnosis not present

## 2023-02-25 DIAGNOSIS — N138 Other obstructive and reflux uropathy: Secondary | ICD-10-CM | POA: Diagnosis not present

## 2023-02-26 ENCOUNTER — Other Ambulatory Visit: Payer: Self-pay

## 2023-02-26 ENCOUNTER — Other Ambulatory Visit: Payer: Self-pay | Admitting: Urology

## 2023-02-26 ENCOUNTER — Telehealth: Payer: Self-pay

## 2023-02-26 DIAGNOSIS — R972 Elevated prostate specific antigen [PSA]: Secondary | ICD-10-CM

## 2023-02-26 LAB — PSA: Prostate Specific Ag, Serum: 2.9 ng/mL (ref 0.0–4.0)

## 2023-02-26 MED ORDER — FINASTERIDE 5 MG PO TABS: 5.0000 mg | ORAL_TABLET | Freq: Every day | ORAL | 3 refills | Status: DC

## 2023-02-26 NOTE — Telephone Encounter (Signed)
-----   Message from Bjorn Pippin, MD sent at 02/26/2023 12:57 PM EDT ----- His PSA is down on finasteride.  If he is doing well we could reschedule him for 6 months with another PSA ----- Message ----- From: Grier Rocher, CMA Sent: 02/26/2023   8:35 AM EDT To: Bjorn Pippin, MD  F/u 06/27

## 2023-02-26 NOTE — Telephone Encounter (Signed)
Spoke with facility Nurse, she is aware of MD response.  States pt is doing well and agreed to move apt out 6 months.  Lab and OV rescheduled and lab orders in.  Nurse is aware to reach out if finasteride rx needs to be refilled before next apt.

## 2023-03-04 ENCOUNTER — Ambulatory Visit: Payer: Medicare Other | Admitting: Urology

## 2023-03-04 DIAGNOSIS — L720 Epidermal cyst: Secondary | ICD-10-CM | POA: Diagnosis not present

## 2023-03-04 DIAGNOSIS — I10 Essential (primary) hypertension: Secondary | ICD-10-CM | POA: Diagnosis not present

## 2023-03-29 DIAGNOSIS — H25813 Combined forms of age-related cataract, bilateral: Secondary | ICD-10-CM | POA: Diagnosis not present

## 2023-04-21 ENCOUNTER — Encounter: Payer: Self-pay | Admitting: Internal Medicine

## 2023-04-21 ENCOUNTER — Ambulatory Visit: Payer: Medicare Other | Attending: Internal Medicine | Admitting: Internal Medicine

## 2023-04-21 VITALS — BP 124/78 | HR 87 | Ht 67.0 in | Wt 139.8 lb

## 2023-04-21 DIAGNOSIS — E785 Hyperlipidemia, unspecified: Secondary | ICD-10-CM

## 2023-04-21 DIAGNOSIS — I351 Nonrheumatic aortic (valve) insufficiency: Secondary | ICD-10-CM | POA: Diagnosis not present

## 2023-04-21 DIAGNOSIS — I483 Typical atrial flutter: Secondary | ICD-10-CM

## 2023-04-21 NOTE — Progress Notes (Signed)
Cardiology Office Note  Date: 04/21/2023   ID: [name], DOB 1951-08-23, MRN [id number]  PCP:  Toma Deiters, MD  Cardiologist:  Marjo Bicker, MD Electrophysiologist:  None   History of Present Illness: Marc Jimenez is a 72 y.o. male known to have paroxysmal atrial flutter, HTN, mild aortic regurgitation is here for follow-up visit.  Accompanied by caretaker.  No symptoms, overall doing great.  No palpitations, dizziness, presyncope, syncope, angina or DOE.  No orthopnea, PND.  Uses CPAP machine at night.  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  No risk of falls.  Past Medical History:  Diagnosis Date   Alcohol abuse    Arrhythmia    Cataplexy    Glaucoma    Heart disease    Hypertension    Narcolepsy    Narcolepsy     Past Surgical History:  Procedure Laterality Date   FLEXIBLE SIGMOIDOSCOPY N/A 02/12/2021   Procedure: FLEXIBLE SIGMOIDOSCOPY;  Surgeon: Dolores Frame, MD;  Location: [facility];  Service: Gastroenterology;  Laterality: N/A;   HERNIA REPAIR     PARTIAL COLECTOMY N/A 02/14/2021   Procedure: PARTIAL COLECTOMY;  Surgeon: Franky Macho, MD;  Location: [facility];  Service: General;  Laterality: N/A;    Current Outpatient Medications  Medication Sig Dispense Refill   acetaminophen (TYLENOL) 650 MG CR tablet Take by mouth.     amLODipine (NORVASC) 5 MG tablet Take 1 tablet by mouth daily.     apixaban (ELIQUIS) 5 MG TABS tablet Take 1 tablet (5 mg total) by mouth 2 (two) times daily. 60 tablet 0   cephALEXin (KEFLEX) 250 MG capsule Take 250 mg by mouth daily.     COMBIGAN 0.2-0.5 % ophthalmic solution Place 1 drop into both eyes every 12 (twelve) hours.     finasteride (PROSCAR) 5 MG tablet Take 1 tablet (5 mg total) by mouth daily. 90 tablet 3   folic acid (FOLVITE) 1 MG tablet Take 1 tablet (1 mg total) by mouth daily. 30 tablet 1   furosemide (LASIX) 20 MG tablet Take 10 mg by mouth daily.     LUMIGAN 0.01 %  SOLN Place 1 drop into both eyes at bedtime.     QUEtiapine (SEROQUEL) 25 MG tablet Take 12.5 mg by mouth at bedtime.     rosuvastatin (CRESTOR) 10 MG tablet Take 10 mg by mouth at bedtime.     tamsulosin (FLOMAX) 0.4 MG CAPS capsule Take 0.4 mg by mouth daily.     thiamine (VITAMIN B-1) 100 MG tablet Take 100 mg by mouth daily.     tiZANidine (ZANAFLEX) 2 MG tablet Take 2 mg by mouth at bedtime.     No current facility-administered medications for this visit.   Allergies:  Patient has no known allergies.   Social History: The patient  reports that he has been smoking cigarettes. He has a 30 pack-year smoking history. He has never used smokeless tobacco. He reports current alcohol use of about 21.0 standard drinks of alcohol per week. He reports current drug use. Drug: Marijuana.   Family History: The patient's Family history is unknown by patient.   ROS:  Please see the history of present illness. Otherwise, complete review of systems is positive for none.  All other systems are reviewed and negative.   Physical Exam: VS:  BP 124/78 (BP Location: Right Arm, Patient Position: Sitting, Cuff Size: Normal)   Pulse 87   Ht 5\' 7"  (  1.702 m)   Wt 139 lb 12.8 oz (63.4 kg)   SpO2 95%   BMI 21.90 kg/m , BMI Body mass index is 21.9 kg/m.  Wt Readings from Last 3 Encounters:  04/21/23 139 lb 12.8 oz (63.4 kg)  10/29/22 143 lb 12.8 oz (65.2 kg)  06/15/22 139 lb (63 kg)    General: Patient appears comfortable at rest. HEENT: Conjunctiva and lids normal, oropharynx clear with moist mucosa. Neck: Supple, no elevated JVP or carotid bruits, no thyromegaly. Lungs: Clear to auscultation, nonlabored breathing at rest. Cardiac: Regular rate and rhythm, no S3 or significant systolic murmur, no pericardial rub. Abdomen: Soft, nontender, no hepatomegaly, bowel sounds present, no guarding or rebound. Extremities: No pitting edema, distal pulses 2+. Skin: Warm and dry. Musculoskeletal: No  kyphosis. Neuropsychiatric: Alert and oriented x3, affect grossly appropriate.  Recent Labwork: No results found for requested labs within last 365 days.  No results found for: "CHOL", "TRIG", "HDL", "CHOLHDL", "VLDL", "LDLCALC", "LDLDIRECT"   Assessment and Plan:  Paroxysmal atrial flutter: EKG today showed NSR, RBBB, first-degree block.  Asymptomatic.  Not on rate controlling agents.  Continue Eliquis 5 mg twice daily.  No risk of falls.  Mild aortic regurgitation in 2022: Obtain 2D echocardiogram in 2025 before the next clinic visit.  HTN, controlled: Continue amlodipine 5 mg once daily, p.o. Lasix 10 mg once daily.  Follows with PCP.  HLD: Continue rosuvastatin 10 mg nightly, goal LDL less than 100.  Follows with PCP.   Medication Adjustments/Labs and Tests Ordered: Current medicines are reviewed at length with the patient today.  Concerns regarding medicines are outlined above.    Disposition:  Follow up 1 year  Signed,  Verne Spurr, MD, 04/21/2023 2:04 PM    [facility] [address], [place], [place] [address]

## 2023-04-21 NOTE — Patient Instructions (Signed)
Medication Instructions:  Your physician recommends that you continue on your current medications as directed. Please refer to the Current Medication list given to you today.  *If you need a refill on your cardiac medications before your next appointment, please call your pharmacy*   Lab Work: None If you have labs (blood work) drawn today and your tests are completely normal, you will receive your results only by: [facility] (if you have MyChart) OR A paper copy in the mail If you have any lab test that is abnormal or we need to change your treatment, we will call you to review the results.   Testing/Procedures: Your physician has requested that you have an echocardiogram. Echocardiography is a painless test that uses sound waves to create images of your heart. It provides your doctor with information about the size and shape of your heart and how well your heart's chambers and valves are working. This procedure takes approximately one hour. There are no restrictions for this procedure. Please do NOT wear cologne, perfume, aftershave, or lotions (deodorant is allowed). Please arrive 15 minutes prior to your appointment time.    Follow-Up: At [facility], you and your health needs are our priority.  As part of our continuing mission to provide you with exceptional heart care, we have created designated Provider Care Teams.  These Care Teams include your primary Cardiologist (physician) and Advanced Practice Providers (APPs -  Physician Assistants and Nurse Practitioners) who all work together to provide you with the care you need, when you need it.  We recommend signing up for the patient portal called "MyChart".  Sign up information is provided on this After Visit Summary.  MyChart is used to connect with patients for Virtual Visits (Telemedicine).  Patients are able to view lab/test results, encounter notes, upcoming appointments, etc.  Non-urgent messages can be sent to your  provider as well.   To learn more about what you can do with MyChart, go to [email or web address].    Your next appointment:   1 year(s)  Provider:   Claudina Lick, MD    Other Instructions

## 2023-04-29 DIAGNOSIS — B351 Tinea unguium: Secondary | ICD-10-CM | POA: Diagnosis not present

## 2023-04-29 DIAGNOSIS — M79675 Pain in left toe(s): Secondary | ICD-10-CM | POA: Diagnosis not present

## 2023-04-29 DIAGNOSIS — M79674 Pain in right toe(s): Secondary | ICD-10-CM | POA: Diagnosis not present

## 2023-05-26 ENCOUNTER — Ambulatory Visit (HOSPITAL_COMMUNITY)
Admission: RE | Admit: 2023-05-26 | Discharge: 2023-05-26 | Disposition: A | Discharge status: Home / Self Care | Payer: Medicare Other | Source: Ambulatory Visit | Attending: Internal Medicine | Admitting: Internal Medicine

## 2023-05-26 DIAGNOSIS — I351 Nonrheumatic aortic (valve) insufficiency: Secondary | ICD-10-CM | POA: Insufficient documentation

## 2023-05-26 LAB — ECHOCARDIOGRAM COMPLETE
Area-P 1/2: 3.48 cm2
S' Lateral: 2.65 cm

## 2023-05-26 NOTE — Progress Notes (Signed)
*  PRELIMINARY RESULTS* Echocardiogram 2D Echocardiogram has been performed.  Stacey Drain 05/26/2023, 12:44 PM

## 2023-06-01 ENCOUNTER — Telehealth: Payer: Self-pay | Admitting: Internal Medicine

## 2023-06-01 NOTE — Telephone Encounter (Signed)
Called nephew to notify that he is not on the [facility] and we are unable to talk with him.

## 2023-06-01 NOTE — Telephone Encounter (Signed)
Nephew is calling to speak to the nurse about the patient. He recently had a stay in the hospital. Please advise

## 2023-07-13 DIAGNOSIS — Z23 Encounter for immunization: Secondary | ICD-10-CM | POA: Diagnosis not present

## 2023-07-29 DIAGNOSIS — B351 Tinea unguium: Secondary | ICD-10-CM | POA: Diagnosis not present

## 2023-07-29 DIAGNOSIS — M79674 Pain in right toe(s): Secondary | ICD-10-CM | POA: Diagnosis not present

## 2023-07-29 DIAGNOSIS — M79675 Pain in left toe(s): Secondary | ICD-10-CM | POA: Diagnosis not present

## 2023-08-24 ENCOUNTER — Other Ambulatory Visit: Payer: Medicare Other

## 2023-09-07 ENCOUNTER — Other Ambulatory Visit: Payer: Medicare Other

## 2023-09-07 DIAGNOSIS — R972 Elevated prostate specific antigen [PSA]: Secondary | ICD-10-CM

## 2023-09-08 LAB — PSA: Prostate Specific Ag, Serum: 3.5 ng/mL (ref 0.0–4.0)

## 2023-09-09 ENCOUNTER — Ambulatory Visit: Payer: Medicare Other | Admitting: Urology

## 2023-09-09 VITALS — BP 109/74 | HR 69

## 2023-09-09 DIAGNOSIS — N3281 Overactive bladder: Secondary | ICD-10-CM

## 2023-09-09 DIAGNOSIS — N401 Enlarged prostate with lower urinary tract symptoms: Secondary | ICD-10-CM | POA: Diagnosis not present

## 2023-09-09 DIAGNOSIS — R972 Elevated prostate specific antigen [PSA]: Secondary | ICD-10-CM

## 2023-09-09 DIAGNOSIS — R3915 Urgency of urination: Secondary | ICD-10-CM

## 2023-09-09 DIAGNOSIS — N138 Other obstructive and reflux uropathy: Secondary | ICD-10-CM | POA: Diagnosis not present

## 2023-09-09 DIAGNOSIS — N411 Chronic prostatitis: Secondary | ICD-10-CM

## 2023-09-09 LAB — URINALYSIS, ROUTINE W REFLEX MICROSCOPIC
Bilirubin, UA: NEGATIVE
Glucose, UA: NEGATIVE
Ketones, UA: NEGATIVE
Leukocytes,UA: NEGATIVE
Nitrite, UA: NEGATIVE
Protein,UA: NEGATIVE
RBC, UA: NEGATIVE
Specific Gravity, UA: 1.025 (ref 1.005–1.030)
Urobilinogen, Ur: 1 mg/dL (ref 0.2–1.0)
pH, UA: 6 (ref 5.0–7.5)

## 2023-09-09 MED ORDER — CEPHALEXIN 250 MG PO CAPS: 250.0000 mg | ORAL_CAPSULE | Freq: Every day | ORAL | 3 refills | Status: AC

## 2023-09-09 MED ORDER — FINASTERIDE 5 MG PO TABS: 5.0000 mg | ORAL_TABLET | Freq: Every day | ORAL | 3 refills | Status: DC

## 2023-09-09 MED ORDER — TAMSULOSIN HCL 0.4 MG PO CAPS: 0.4000 mg | ORAL_CAPSULE | Freq: Every day | ORAL | 3 refills | Status: DC

## 2023-09-09 NOTE — Progress Notes (Signed)
 Subjective: 1. Elevated PSA   2. BPH with urinary obstruction   3. Urgency of urination   4. Chronic prostatitis    09/09/23: Marc Jimenez returns today in f/u.  He remains on tamsulosin  and finasteride  as well as suppressive keflex .  His UA is clear today.  HIs PSA was 3.5 which is minimally increased from 2.9 last year.  His IPSS is 10 with nocturia x 3.    08/27/22: Marc Jimenez returns today in f/u.  He was seen in 06/15/22 for possible UTI.   He was given Macrobid  at that visit for 7 days but he grew providencia that was resistant and was changed to Cefdinir  on 06/25/22.  He remains on keflex  as a suppressive antibiotic.   He has BPH with BOO and is on tamsulosin  and finasteride . His UA is clear today.    05/07/22: Marc Jimenez returns today in f/u for his history of and elevated PSA and recurrent UTI's.  His PVR today is .  He was seen in the ER on 03/17/22 for AMS and fevere with leukocytosis and was found to have an e. Coli UTI.  Had a CT and renal US  that showed no stones or obstruction but there was a mildly complex RUP renal cyst 1.6cm in size.  His prostate has a transverse diameter of 5cm.  He remains on tamsulosin  for BPH with BOO.  He is on Cefuroxime at this time for another e. Coli UTI on culture on 05/04/22.  He has some dysuria.  His IPSS is 27 with nocturia x 4.   UA has 6-10 WBC and few bacteria.   04/10/21: Marc Jimenez returns today in f/u for his history of an elevated PSA.  The PSA is down to 6.0 with an 8.2% f/t ratio on 04/03/21.   He is on tamsulosin  for BPH with BOO.  He had e. Coli on his culture on 01/09/21 and was treated with bactrim  and his urine is clear today.   10/09/21: Marc Jimenez returns today in f/u.  His PSA prior to this visit is 6.2 with a 9.5% f/t ratio.  This is stable over the past year.  He had a CT in September that showed prostate enlargement and right renal cysts.  He remains on tamsulosin .   He has a history of UTI's.  He has no new voiding complaints.  He has no  hematuria.  His UA is clear.     Gu hx: Marc Jimenez is a 73 yo who is sent for a PSA of 7.1 on labs from 10/11/20.  He was seen in [place] in 2019 for BPH with BOO and was given tamsulosin  but failed to f/u.   He remains on tamsulosin .   He has a long history of alcoholism and was not felt to be a good candidate for PSA screening at that time.   He is currently in assisted living facility.  He had the PSA checked because of LUTS.  He has some frequency and urgency and UUI.  He has some hesitancy with an intermittent stream.   He has an adequate stream.  He has had no hematuria or dysuria.  He has had dark urine with some odor.  His UA today has Pyuria and bacteriuria.   He had a clear urine on 11/06/20 at [facility].  His Cr was 1.07.  He was seen for a sigmoid volvulus at that time.  On the CT his prostate was large but he wasn't in retention.  He didn't have  a foley.   PVR today is 47ml.   IPSS     Row Name 09/09/23 1200         International Prostate Symptom Score   How often have you had the sensation of not emptying your bladder? Less than half the time     How often have you had to urinate less than every two hours? Less than 1 in 5 times     How often have you found you stopped and started again several times when you urinated? Less than 1 in 5 times     How often have you found it difficult to postpone urination? Less than 1 in 5 times     How often have you had a weak urinary stream? Less than 1 in 5 times     How often have you had to strain to start urination? Less than 1 in 5 times     How many times did you typically get up at night to urinate? 3 Times     Total IPSS Score 10       Quality of Life due to urinary symptoms   If you were to spend the rest of your life with your urinary condition just the way it is now how would you feel about that? Pleased                ROS:  Review of Systems  Unable to perform ROS: Mental acuity    No Known Allergies  Past Medical  History:  Diagnosis Date   Alcohol abuse    Arrhythmia    Cataplexy    Glaucoma    Heart disease    Hypertension    Narcolepsy    Narcolepsy     Past Surgical History:  Procedure Laterality Date   FLEXIBLE SIGMOIDOSCOPY N/A 02/12/2021   Procedure: FLEXIBLE SIGMOIDOSCOPY;  Surgeon: Eartha Angelia Sieving, MD;  [facility]: [facility];  Service: Gastroenterology;  Laterality: N/A;   HERNIA REPAIR     PARTIAL COLECTOMY N/A 02/14/2021   Procedure: PARTIAL COLECTOMY;  Surgeon: Mavis Anes, MD;  Location: [facility];  Service: General;  Laterality: N/A;    Social History   Socioeconomic History   Marital status: Legally Separated    Spouse name: Not on file   Number of children: Not on file   Years of education: Not on file   Highest education level: Not on file  Occupational History   Not on file  Tobacco Use   Smoking status: Every Day    Current packs/day: 1.00    Average packs/day: 1 pack/day for 30.0 years (30.0 ttl pk-yrs)    Types: Cigarettes   Smokeless tobacco: Never  Vaping Use   Vaping status: Never Used  Substance and Sexual Activity   Alcohol use: Yes    Alcohol/week: 21.0 standard drinks of alcohol    Types: 21 Cans of beer per week    Comment: 12 pack of beer daily.    Drug use: Yes    Types: Marijuana   Sexual activity: Not on file  Other Topics Concern   Not on file  Social History Narrative   Not on file   Social Drivers of Health   Financial Resource Strain: Not on file  Food Insecurity: Not on file  Transportation Needs: Not on file  Physical Activity: Not on file  Stress: Not on file  Social Connections: Not on file  Intimate Partner Violence: Not on file    Family History  Family history unknown: Yes    Anti-infectives: Anti-infectives (From admission, onward)    Start     Dose/Rate Route Frequency Ordered Stop   09/09/23 0000  cephALEXin  (KEFLEX ) 250 MG capsule        250 mg Oral Daily 09/09/23 1215         Current Outpatient  Medications  Medication Sig Dispense Refill   acetaminophen  (TYLENOL ) 650 MG CR tablet Take by mouth.     amLODipine  (NORVASC ) 5 MG tablet Take 1 tablet by mouth daily.     COMBIGAN  0.2-0.5 % ophthalmic solution Place 1 drop into both eyes every 12 (twelve) hours.     folic acid  (FOLVITE ) 1 MG tablet Take 1 tablet (1 mg total) by mouth daily. 30 tablet 1   furosemide (LASIX) 20 MG tablet Take 10 mg by mouth daily.     LUMIGAN 0.01 % SOLN Place 1 drop into both eyes at bedtime.     QUEtiapine  (SEROQUEL ) 25 MG tablet Take 12.5 mg by mouth at bedtime.     rosuvastatin (CRESTOR) 10 MG tablet Take 10 mg by mouth at bedtime.     thiamine  (VITAMIN B-1) 100 MG tablet Take 100 mg by mouth daily.     tiZANidine (ZANAFLEX) 2 MG tablet Take 2 mg by mouth at bedtime.     apixaban  (ELIQUIS ) 5 MG TABS tablet Take 1 tablet (5 mg total) by mouth 2 (two) times daily. 60 tablet 0   cephALEXin  (KEFLEX ) 250 MG capsule Take 1 capsule (250 mg total) by mouth daily. 90 capsule 3   finasteride  (PROSCAR ) 5 MG tablet Take 1 tablet (5 mg total) by mouth daily. 90 tablet 3   tamsulosin  (FLOMAX ) 0.4 MG CAPS capsule Take 1 capsule (0.4 mg total) by mouth daily. 90 capsule 3   No current facility-administered medications for this visit.     Objective: Vital signs in last 24 hours: BP 109/74   Pulse 69   Intake/Output from previous day: No intake/output data recorded. Intake/Output this shift: @IOTHISSHIFT @   Physical Exam Vitals reviewed.     Lab Results:  Recent Results (from the past 2160 hours)  PSA     Status: None   Collection Time: 09/07/23  1:00 PM  Result Value Ref Range   Prostate Specific Ag, Serum 3.5 0.0 - 4.0 ng/mL    Comment: Roche ECLIA methodology. According to the American Urological Association, Serum PSA should decrease and remain at undetectable levels after radical prostatectomy. The AUA defines biochemical recurrence as an initial PSA value 0.2 ng/mL or greater followed by a  subsequent confirmatory PSA value 0.2 ng/mL or greater. Values obtained with different assay methods or kits cannot be used interchangeably. Results cannot be interpreted as absolute evidence of the presence or absence of malignant disease.   Urinalysis, Routine w reflex microscopic     Status: None   Collection Time: 09/09/23 12:06 PM  Result Value Ref Range   Specific Gravity, UA 1.025 1.005 - 1.030   pH, UA 6.0 5.0 - 7.5   Color, UA Yellow Yellow   Appearance Ur Clear Clear   Leukocytes,UA Negative Negative   Protein,UA Negative Negative/Trace   Glucose, UA Negative Negative   Ketones, UA Negative Negative   RBC, UA Negative Negative   Bilirubin, UA Negative Negative   Urobilinogen, Ur 1.0 0.2 - 1.0 mg/dL   Nitrite, UA Negative Negative   Microscopic Examination Comment     Comment: Microscopic not indicated and not performed.  BMET No results for input(s): NA, K, CL, CO2, GLUCOSE, BUN, CREATININE, CALCIUM in the last 72 hours. PT/INR No results for input(s): LABPROT, INR in the last 72 hours. ABG No results for input(s): PHART, HCO3 in the last 72 hours.  Invalid input(s): PCO2, PO2 UA is clear. .  Studies/Results: No results found.    Assessment/Plan: Recurrent UTI's.  His UA is clear on keflex  nightly.  Continue current therapy.   BPH with BOO and OAB.  He will continue the tamsulosin  and  finasteride .   Meds refilled.    Elevated PSA.  PSA is minimally increased on finasteride .  Repeat in a year.      Meds ordered this encounter  Medications   finasteride  (PROSCAR ) 5 MG tablet    Sig: Take 1 tablet (5 mg total) by mouth daily.    Dispense:  90 tablet    Refill:  3   cephALEXin  (KEFLEX ) 250 MG capsule    Sig: Take 1 capsule (250 mg total) by mouth daily.    Dispense:  90 capsule    Refill:  3   tamsulosin  (FLOMAX ) 0.4 MG CAPS capsule    Sig: Take 1 capsule (0.4 mg total) by mouth daily.    Dispense:  90 capsule     Refill:  3      Orders Placed This Encounter  Procedures   Urinalysis, Routine w reflex microscopic   PSA, total and free    Standing Status:   Future    Expected Date:   09/04/2024    Expiration Date:   09/08/2024     Return in about 1 year (around 09/08/2024) for with PSA.    CC: Dr. Yancey Reno.      Norleen Seltzer 09/10/2023 [phone] ID: Vinie JONETTA Lunger, male   DOB: 27-May-1951, 73 y.o.   MRN: [id number]

## 2023-09-10 ENCOUNTER — Encounter: Payer: Self-pay | Admitting: Urology

## 2023-10-25 DIAGNOSIS — B351 Tinea unguium: Secondary | ICD-10-CM | POA: Diagnosis not present

## 2023-10-25 DIAGNOSIS — M79674 Pain in right toe(s): Secondary | ICD-10-CM | POA: Diagnosis not present

## 2023-10-25 DIAGNOSIS — M79675 Pain in left toe(s): Secondary | ICD-10-CM | POA: Diagnosis not present

## 2024-01-18 DIAGNOSIS — I10 Essential (primary) hypertension: Secondary | ICD-10-CM | POA: Diagnosis not present

## 2024-01-18 DIAGNOSIS — I82721 Chronic embolism and thrombosis of deep veins of right upper extremity: Secondary | ICD-10-CM | POA: Diagnosis not present

## 2024-01-18 DIAGNOSIS — I700 Atherosclerosis of aorta: Secondary | ICD-10-CM | POA: Diagnosis not present

## 2024-01-18 DIAGNOSIS — Z0000 Encounter for general adult medical examination without abnormal findings: Secondary | ICD-10-CM | POA: Diagnosis not present

## 2024-01-18 DIAGNOSIS — F99 Mental disorder, not otherwise specified: Secondary | ICD-10-CM | POA: Diagnosis not present

## 2024-01-18 DIAGNOSIS — N182 Chronic kidney disease, stage 2 (mild): Secondary | ICD-10-CM | POA: Diagnosis not present

## 2024-01-27 DIAGNOSIS — B351 Tinea unguium: Secondary | ICD-10-CM | POA: Diagnosis not present

## 2024-01-27 DIAGNOSIS — M79674 Pain in right toe(s): Secondary | ICD-10-CM | POA: Diagnosis not present

## 2024-01-27 DIAGNOSIS — M79675 Pain in left toe(s): Secondary | ICD-10-CM | POA: Diagnosis not present

## 2024-02-14 ENCOUNTER — Other Ambulatory Visit

## 2024-02-24 ENCOUNTER — Ambulatory Visit: Admitting: Urology

## 2024-02-24 VITALS — BP 112/72 | HR 97

## 2024-02-24 DIAGNOSIS — R972 Elevated prostate specific antigen [PSA]: Secondary | ICD-10-CM | POA: Diagnosis not present

## 2024-02-24 DIAGNOSIS — N138 Other obstructive and reflux uropathy: Secondary | ICD-10-CM

## 2024-02-24 DIAGNOSIS — R339 Retention of urine, unspecified: Secondary | ICD-10-CM

## 2024-02-24 DIAGNOSIS — R338 Other retention of urine: Secondary | ICD-10-CM | POA: Diagnosis not present

## 2024-02-24 DIAGNOSIS — N411 Chronic prostatitis: Secondary | ICD-10-CM

## 2024-02-24 DIAGNOSIS — R3915 Urgency of urination: Secondary | ICD-10-CM | POA: Diagnosis not present

## 2024-02-24 DIAGNOSIS — N401 Enlarged prostate with lower urinary tract symptoms: Secondary | ICD-10-CM | POA: Diagnosis not present

## 2024-02-24 MED ORDER — TAMSULOSIN HCL 0.4 MG PO CAPS: 0.8000 mg | ORAL_CAPSULE | Freq: Every day | ORAL | 3 refills | Status: DC

## 2024-02-24 NOTE — Progress Notes (Signed)
 Subjective: 1. BPH with urinary obstruction   2. Urgency of urination   3. Incomplete bladder emptying   4. Elevated PSA   5. Chronic prostatitis     02/24/24: Marc Jimenez returns today in f/u with some increased voiding symptoms.  He doesn't think he is able to empty well.  He is on tamsulosin  and finasteride  for BPH with BOO and keflex  for suppression of UTI's.  His last PSA was 3.5 in 12/24.   His PVR is 62ml today.  He had a recent UA that was negative.   09/09/23: Marc Jimenez returns today in f/u.  He remains on tamsulosin  and finasteride  as well as suppressive keflex .  His UA is clear today.  HIs PSA was 3.5 which is minimally increased from 2.9 last year.  His IPSS is 10 with nocturia x 3.    08/27/22: [name] returns today in f/u.  He was seen in 06/15/22 for possible UTI.   He was given Macrobid  at that visit for 7 days but he grew providencia that was resistant and was changed to Cefdinir  on 06/25/22.  He remains on keflex  as a suppressive antibiotic.   He has BPH with BOO and is on tamsulosin  and finasteride . His UA is clear today.    05/07/22: [name] returns today in f/u for his history of and elevated PSA and recurrent UTI's.  His PVR today is .  He was seen in the ER on 03/17/22 for AMS and fevere with leukocytosis and was found to have an e. Coli UTI.  Had a CT and renal US  that showed no stones or obstruction but there was a mildly complex RUP renal cyst 1.6cm in size.  His prostate has a transverse diameter of 5cm.  He remains on tamsulosin  for BPH with BOO.  He is on Cefuroxime at this time for another e. Coli UTI on culture on 05/04/22.  He has some dysuria.  His IPSS is 27 with nocturia x 4.   UA has 6-10 WBC and few bacteria.   04/10/21: [name] returns today in f/u for his history of an elevated PSA.  The PSA is down to 6.0 with an 8.2% f/t ratio on 04/03/21.   He is on tamsulosin  for BPH with BOO.  He had e. Coli on his culture on 01/09/21 and was treated with bactrim  and his urine is  clear today.   10/09/21: [name] returns today in f/u.  His PSA prior to this visit is 6.2 with a 9.5% f/t ratio.  This is stable over the past year.  He had a CT in September that showed prostate enlargement and right renal cysts.  He remains on tamsulosin .   He has a history of UTI's.  He has no new voiding complaints.  He has no hematuria.  His UA is clear.     Gu hx: [name] is a 73 yo who is sent for a PSA of 7.1 on labs from 10/11/20.  He was seen in [place] in 2019 for BPH with BOO and was given tamsulosin  but failed to f/u.   He remains on tamsulosin .   He has a long history of alcoholism and was not felt to be a good candidate for PSA screening at that time.   He is currently in assisted living facility.  He had the PSA checked because of LUTS.  He has some frequency and urgency and UUI.  He has some hesitancy with an intermittent stream.   He has an  adequate stream.  He has had no hematuria or dysuria.  He has had dark urine with some odor.  His UA today has Pyuria and bacteriuria.   He had a clear urine on 11/06/20 at [facility].  His Cr was 1.07.  He was seen for a sigmoid volvulus at that time.  On the CT his prostate was large but he wasn't in retention.  He didn't have a foley.   PVR today is 47ml.       ROS:  Review of Systems  Unable to perform ROS: Mental acuity    No Known Allergies  Past Medical History:  Diagnosis Date   Alcohol abuse    Arrhythmia    Cataplexy    Glaucoma    Heart disease    Hypertension    Narcolepsy    Narcolepsy     Past Surgical History:  Procedure Laterality Date   FLEXIBLE SIGMOIDOSCOPY N/A 02/12/2021   Procedure: FLEXIBLE SIGMOIDOSCOPY;  Surgeon: Marc Angelia Sieving, MD;  Location: [facility];  Service: Gastroenterology;  Laterality: N/A;   HERNIA REPAIR     PARTIAL COLECTOMY N/A 02/14/2021   Procedure: PARTIAL COLECTOMY;  Surgeon: Marc Anes, MD;  Location: [facility];  Service: General;  Laterality: N/A;    Social History    Socioeconomic History   Marital status: Legally Separated    Spouse name: Not on file   Number of children: Not on file   Years of education: Not on file   Highest education level: Not on file  Occupational History   Not on file  Tobacco Use   Smoking status: Every Day    Current packs/day: 1.00    Average packs/day: 1 pack/day for 30.0 years (30.0 ttl pk-yrs)    Types: Cigarettes   Smokeless tobacco: Never  Vaping Use   Vaping status: Never Used  Substance and Sexual Activity   Alcohol use: Yes    Alcohol/week: 21.0 standard drinks of alcohol    Types: 21 Cans of beer per week    Comment: 12 pack of beer daily.    Drug use: Yes    Types: Marijuana   Sexual activity: Not on file  Other Topics Concern   Not on file  Social History Narrative   Not on file   Social Drivers of Health   Financial Resource Strain: Not on file  Food Insecurity: Not on file  Transportation Needs: Not on file  Physical Activity: Not on file  Stress: Not on file  Social Connections: Not on file  Intimate Partner Violence: Not on file    Family History  Family history unknown: Yes    Anti-infectives: Anti-infectives (From admission, onward)    None       Current Outpatient Medications  Medication Sig Dispense Refill   acetaminophen  (TYLENOL ) 650 MG CR tablet Take by mouth.     amLODipine  (NORVASC ) 5 MG tablet Take 1 tablet by mouth daily.     apixaban  (ELIQUIS ) 5 MG TABS tablet Take 1 tablet (5 mg total) by mouth 2 (two) times daily. 60 tablet 0   cephALEXin  (KEFLEX ) 250 MG capsule Take 1 capsule (250 mg total) by mouth daily. 90 capsule 3   COMBIGAN  0.2-0.5 % ophthalmic solution Place 1 drop into both eyes every 12 (twelve) hours.     finasteride  (PROSCAR ) 5 MG tablet Take 1 tablet (5 mg total) by mouth daily. 90 tablet 3   folic acid  (FOLVITE ) 1 MG tablet Take 1 tablet (1 mg total) by  mouth daily. 30 tablet 1   furosemide (LASIX) 20 MG tablet Take 10 mg by mouth daily.      LUMIGAN 0.01 % SOLN Place 1 drop into both eyes at bedtime.     QUEtiapine  (SEROQUEL ) 25 MG tablet Take 12.5 mg by mouth at bedtime.     rosuvastatin (CRESTOR) 10 MG tablet Take 10 mg by mouth at bedtime.     thiamine  (VITAMIN B-1) 100 MG tablet Take 100 mg by mouth daily.     tiZANidine (ZANAFLEX) 2 MG tablet Take 2 mg by mouth at bedtime.     tamsulosin  (FLOMAX ) 0.4 MG CAPS capsule Take 2 capsules (0.8 mg total) by mouth daily. 180 capsule 3   No current facility-administered medications for this visit.     Objective: Vital signs in last 24 hours: BP 112/72   Pulse 97   Intake/Output from previous day: No intake/output data recorded. Intake/Output this shift: @IOTHISSHIFT @   Physical Exam Vitals reviewed.     Lab Results:  No results found for this or any previous visit (from the past 2160 hours).     BMET No results for input(s): NA, K, CL, CO2, GLUCOSE, BUN, CREATININE, CALCIUM in the last 72 hours. PT/INR No results for input(s): LABPROT, INR in the last 72 hours. ABG No results for input(s): PHART, HCO3 in the last 72 hours.  Invalid input(s): PCO2, PO2 UA is clear. .  Studies/Results: No results found.  PVR is 62ml  Assessment/Plan: Recurrent UTI's.  His UA was clear on recent check with keflex  nightly.  Continue current therapy.   BPH with BOO and OAB.  He will continue the tamsulosin  and  finasteride  but I will double the tamsulosin .   Elevated PSA.  PSA was minimally increased on finasteride  at last visit.  Keep f/u next January as scheduled.      Meds ordered this encounter  Medications   tamsulosin  (FLOMAX ) 0.4 MG CAPS capsule    Sig: Take 2 capsules (0.8 mg total) by mouth daily.    Dispense:  180 capsule    Refill:  3      No orders of the defined types were placed in this encounter.    Return in about 6 months (around 08/25/2024) for Keep 09/14/24 appointment with PSA results. .    CC: Dr. Yancey Reno.       Norleen Marc Jimenez 02/25/2024 [phone] ID: Vinie JONETTA Lunger, male   DOB: November 07, 1950, 73 y.o.   MRN: [id number]

## 2024-02-29 ENCOUNTER — Telehealth: Payer: Self-pay | Admitting: Urology

## 2024-02-29 NOTE — Telephone Encounter (Signed)
 Would like to speak with someone about last visit with Dr. Watt. He wants to know if he did a prostate exam and an update on what was discussed.

## 2024-03-01 NOTE — Telephone Encounter (Addendum)
 Patient's nephew is concerned because he states prostate cancer runs in Marc Jimenez family.  His nephew wants to know if a prostate exam was done when patient was last seen in office.  He is asking if patient is at risk for prostate cancer.

## 2024-03-06 NOTE — Telephone Encounter (Signed)
 I spoke with patient's nephew and notified him of MD response.  He had no further questions at this time.

## 2024-03-06 NOTE — Telephone Encounter (Signed)
 I called nephew with no answer, left a vm to call back.  Direct call back number provided

## 2024-03-30 DIAGNOSIS — B351 Tinea unguium: Secondary | ICD-10-CM | POA: Diagnosis not present

## 2024-03-30 DIAGNOSIS — M79674 Pain in right toe(s): Secondary | ICD-10-CM | POA: Diagnosis not present

## 2024-03-30 DIAGNOSIS — M79675 Pain in left toe(s): Secondary | ICD-10-CM | POA: Diagnosis not present

## 2024-06-20 DIAGNOSIS — M79675 Pain in left toe(s): Secondary | ICD-10-CM | POA: Diagnosis not present

## 2024-06-20 DIAGNOSIS — B351 Tinea unguium: Secondary | ICD-10-CM | POA: Diagnosis not present

## 2024-06-20 DIAGNOSIS — M79674 Pain in right toe(s): Secondary | ICD-10-CM | POA: Diagnosis not present

## 2024-06-27 DIAGNOSIS — H2513 Age-related nuclear cataract, bilateral: Secondary | ICD-10-CM | POA: Diagnosis not present

## 2024-06-27 DIAGNOSIS — H401133 Primary open-angle glaucoma, bilateral, severe stage: Secondary | ICD-10-CM | POA: Diagnosis not present

## 2024-06-27 DIAGNOSIS — Z23 Encounter for immunization: Secondary | ICD-10-CM | POA: Diagnosis not present

## 2024-07-11 DIAGNOSIS — H25812 Combined forms of age-related cataract, left eye: Secondary | ICD-10-CM | POA: Diagnosis not present

## 2024-07-11 DIAGNOSIS — H401123 Primary open-angle glaucoma, left eye, severe stage: Secondary | ICD-10-CM | POA: Diagnosis not present

## 2024-08-07 DIAGNOSIS — H401123 Primary open-angle glaucoma, left eye, severe stage: Secondary | ICD-10-CM | POA: Diagnosis not present

## 2024-08-07 DIAGNOSIS — H40052 Ocular hypertension, left eye: Secondary | ICD-10-CM | POA: Diagnosis not present

## 2024-08-07 DIAGNOSIS — H25812 Combined forms of age-related cataract, left eye: Secondary | ICD-10-CM | POA: Diagnosis not present

## 2024-09-08 ENCOUNTER — Other Ambulatory Visit: Payer: Self-pay

## 2024-09-08 DIAGNOSIS — R972 Elevated prostate specific antigen [PSA]: Secondary | ICD-10-CM

## 2024-09-12 ENCOUNTER — Other Ambulatory Visit

## 2024-09-12 DIAGNOSIS — R972 Elevated prostate specific antigen [PSA]: Secondary | ICD-10-CM

## 2024-09-13 LAB — PSA: Prostate Specific Ag, Serum: 4 ng/mL (ref 0.0–4.0)

## 2024-09-14 ENCOUNTER — Ambulatory Visit: Payer: Medicare Other | Admitting: Urology

## 2024-09-18 ENCOUNTER — Ambulatory Visit: Admitting: Urology

## 2024-09-18 VITALS — BP 118/61 | HR 71

## 2024-09-18 DIAGNOSIS — R338 Other retention of urine: Secondary | ICD-10-CM | POA: Diagnosis not present

## 2024-09-18 DIAGNOSIS — N401 Enlarged prostate with lower urinary tract symptoms: Secondary | ICD-10-CM

## 2024-09-18 DIAGNOSIS — R972 Elevated prostate specific antigen [PSA]: Secondary | ICD-10-CM | POA: Diagnosis not present

## 2024-09-18 DIAGNOSIS — N138 Other obstructive and reflux uropathy: Secondary | ICD-10-CM | POA: Diagnosis not present

## 2024-09-18 DIAGNOSIS — R339 Retention of urine, unspecified: Secondary | ICD-10-CM

## 2024-09-18 MED ORDER — TAMSULOSIN HCL 0.4 MG PO CAPS: 0.8000 mg | ORAL_CAPSULE | Freq: Every day | ORAL | 3 refills | Status: AC

## 2024-09-18 NOTE — Progress Notes (Unsigned)
 "  09/18/2024 11:15 AM   Marc Jimenez 16-Aug-1951 [id number]  Referring provider: Orpha Yancey LABOR, MD [address] [place],  [place] [address]  No chief complaint on file.   HPI: Mr Marc Jimenez is a 74yo here for followup for elevated PSA and BPH. He was previosuly seen by Dr. Watt. PSA increased to 4.0 from 3.5. He denies any worsening LUTS. Uirne stream fair. He has intermittent straining to urinate.   02/24/24: [name] returns today in f/u with some increased voiding symptoms.  He doesn't think he is able to empty well.  He is on tamsulosin  and finasteride  for BPH with BOO and keflex  for suppression of UTI's.  His last PSA was 3.5 in 12/24.   His PVR is 62ml today.  He had a recent UA that was negative.    09/09/23: [name] returns today in f/u.  He remains on tamsulosin  and finasteride  as well as suppressive keflex .  His UA is clear today.  HIs PSA was 3.5 which is minimally increased from 2.9 last year.  His IPSS is 10 with nocturia x 3.     08/27/22: [name] returns today in f/u.  He was seen in 06/15/22 for possible UTI.   He was given Macrobid  at that visit for 7 days but he grew providencia that was resistant and was changed to Cefdinir  on 06/25/22.  He remains on keflex  as a suppressive antibiotic.   He has BPH with BOO and is on tamsulosin  and finasteride . His UA is clear today.     05/07/22: [name] returns today in f/u for his history of and elevated PSA and recurrent UTI's.  His PVR today is .  He was seen in the ER on 03/17/22 for AMS and fevere with leukocytosis and was found to have an e. Coli UTI.  Had a CT and renal US  that showed no stones or obstruction but there was a mildly complex RUP renal cyst 1.6cm in size.  His prostate has a transverse diameter of 5cm.  He remains on tamsulosin  for BPH with BOO.  He is on Cefuroxime at this time for another e. Coli UTI on culture on 05/04/22.  He has some dysuria.  His IPSS is 27 with nocturia x 4.   UA has 6-10 WBC and few  bacteria.   PMH: Past Medical History:  Diagnosis Date   Alcohol abuse    Arrhythmia    Cataplexy    Glaucoma    Heart disease    Hypertension    Narcolepsy    Narcolepsy     Surgical History: Past Surgical History:  Procedure Laterality Date   FLEXIBLE SIGMOIDOSCOPY N/A 02/12/2021   Procedure: FLEXIBLE SIGMOIDOSCOPY;  Surgeon: Eartha Angelia Sieving, MD;  Location: [facility];  Service: Gastroenterology;  Laterality: N/A;   HERNIA REPAIR     PARTIAL COLECTOMY N/A 02/14/2021   Procedure: PARTIAL COLECTOMY;  Surgeon: Mavis Anes, MD;  Location: [facility];  Service: General;  Laterality: N/A;    Home Medications:  Allergies as of 09/18/2024   No Known Allergies      Medication List        Accurate as of September 18, 2024 11:15 AM. If you have any questions, ask your nurse or doctor.          acetaminophen  650 MG CR tablet Commonly known as: TYLENOL  Take by mouth.   amLODipine  5 MG tablet Commonly known as: NORVASC  Take 1 tablet by mouth daily.   apixaban  5  MG Tabs tablet Commonly known as: ELIQUIS  Take 1 tablet (5 mg total) by mouth 2 (two) times daily.   cephALEXin  250 MG capsule Commonly known as: KEFLEX  Take 1 capsule (250 mg total) by mouth daily.   Combigan  0.2-0.5 % ophthalmic solution Generic drug: brimonidine -timolol  Place 1 drop into both eyes every 12 (twelve) hours.   finasteride  5 MG tablet Commonly known as: PROSCAR  Take 1 tablet (5 mg total) by mouth daily.   folic acid  1 MG tablet Commonly known as: FOLVITE  Take 1 tablet (1 mg total) by mouth daily.   furosemide 20 MG tablet Commonly known as: LASIX Take 10 mg by mouth daily.   Lumigan 0.01 % Soln Generic drug: bimatoprost Place 1 drop into both eyes at bedtime.   QUEtiapine  25 MG tablet Commonly known as: SEROQUEL  Take 12.5 mg by mouth at bedtime.   rosuvastatin 10 MG tablet Commonly known as: CRESTOR Take 10 mg by mouth at bedtime.   tamsulosin  0.4 MG Caps  capsule Commonly known as: FLOMAX  Take 2 capsules (0.8 mg total) by mouth daily.   thiamine  100 MG tablet Commonly known as: VITAMIN B1 Take 100 mg by mouth daily.   tiZANidine 2 MG tablet Commonly known as: ZANAFLEX Take 2 mg by mouth at bedtime.        Allergies: Allergies[1]  Family History: Family History  Family history unknown: Yes    Social History:  reports that he has been smoking cigarettes. He has a 30 pack-year smoking history. He has never used smokeless tobacco. He reports current alcohol use of about 21.0 standard drinks of alcohol per week. He reports current drug use. Drug: Marijuana.  ROS: All other review of systems were reviewed and are negative except what is noted above in HPI  Physical Exam: BP 118/61   Pulse 71   Constitutional:  Alert and oriented, No acute distress. HEENT:  AT, moist mucus membranes.  Trachea midline, no masses. Cardiovascular: No clubbing, cyanosis, or edema. Respiratory: Normal respiratory effort, no increased work of breathing. GI: Abdomen is soft, nontender, nondistended, no abdominal masses GU: No CVA tenderness.  Lymph: No cervical or inguinal lymphadenopathy. Skin: No rashes, bruises or suspicious lesions. Neurologic: Grossly intact, no focal deficits, moving all 4 extremities. Psychiatric: Normal mood and affect.  Laboratory Data: Lab Results  Component Value Date   WBC 5.8 03/21/2022   HGB 13.2 03/21/2022   HCT 39.2 03/21/2022   MCV 88.7 03/21/2022   PLT 214 03/21/2022    Lab Results  Component Value Date   CREATININE 0.94 03/21/2022    No results found for: PSA  No results found for: TESTOSTERONE  No results found for: HGBA1C  Urinalysis    Component Value Date/Time   COLORURINE YELLOW 03/17/2022 1335   APPEARANCEUR Clear 09/09/2023 1206   LABSPEC 1.020 03/17/2022 1335   PHURINE 7.0 03/17/2022 1335   GLUCOSEU Negative 09/09/2023 1206   HGBUR SMALL (A) 03/17/2022 1335   BILIRUBINUR  Negative 09/09/2023 1206   KETONESUR 5 (A) 03/17/2022 1335   PROTEINUR Negative 09/09/2023 1206   PROTEINUR 30 (A) 03/17/2022 1335   NITRITE Negative 09/09/2023 1206   NITRITE POSITIVE (A) 03/17/2022 1335   LEUKOCYTESUR Negative 09/09/2023 1206   LEUKOCYTESUR SMALL (A) 03/17/2022 1335    Lab Results  Component Value Date   LABMICR Comment 09/09/2023   WBCUA 11-30 (A) 01/09/2021   LABEPIT 0-10 01/09/2021   BACTERIA MANY (A) 03/17/2022    Pertinent Imaging:  Results for orders placed during the  hospital encounter of 02/10/21  DG Abd 1 View  Narrative CLINICAL DATA:  Volvulus status post sigmoidoscopy.  EXAM: ABDOMEN - 1 VIEW  COMPARISON:  CT of February 10, 2021.  Radiograph of November 07, 2020.  FINDINGS: Large amount of contrast and stool is seen in what appears to be the right colon. No significant bowel dilatation is noted currently. No abnormal calcifications are noted.  IMPRESSION: No significant abnormal bowel dilatation is seen currently.   Electronically Signed By: Lynwood Landy Raddle M.D. On: 02/12/2021 14:53  No results found for this or any previous visit.  No results found for this or any previous visit.  No results found for this or any previous visit.  Results for orders placed during the hospital encounter of 03/17/22  US  RENAL  Narrative CLINICAL DATA:  Right renal lesion follow-up.  EXAM: RENAL / URINARY TRACT ULTRASOUND COMPLETE  COMPARISON:  CT abdomen pelvis from yesterday.  FINDINGS: Right Kidney:  Renal measurements: 11.1 x 4.5 x 5.8 cm = volume: 152 mL. Echogenicity within normal limits. No mass or hydronephrosis visualized. 3.9 cm simple cyst in the lower pole. No follow-up imaging is recommended. 1.6 cm homogeneously hypoechoic lesion with posterior acoustic enhancement and no internal vascularity in the upper pole, consistent with complex cyst.  Left Kidney:  Renal measurements: 9.8 x 6.5 x 6.0 cm = volume: 198 mL. Echogenicity  within normal limits. No mass or hydronephrosis visualized.  Bladder:  Appears normal for degree of bladder distention.  Other:  None.  IMPRESSION: 1. 1.6 cm right upper pole renal lesion seen on CT corresponds to a complex cyst. No follow-up imaging is recommended.   Electronically Signed By: Elsie ONEIDA Shoulder M.D. On: 03/20/2022 10:10  No results found for this or any previous visit.  No results found for this or any previous visit.  No results found for this or any previous visit.   Assessment & Plan:    1. Elevated PSA (Primary) Iso PSA, will call with results. If elevated we will proceed with MRi and possible biopsy. If normal I will see him back in 1 year  2. BPH with urinary obstruction Flomax  0.8mg  daily  3. Incomplete bladder emptying Flomax  0.8mg  daily   No follow-ups on file.  Belvie Clara, MD  [facility] [place]       [1] No Known Allergies  "

## 2024-09-21 ENCOUNTER — Encounter: Payer: Self-pay | Admitting: Urology

## 2024-09-21 NOTE — Patient Instructions (Signed)

## 2024-09-22 ENCOUNTER — Telehealth: Payer: Self-pay

## 2024-09-22 DIAGNOSIS — N401 Enlarged prostate with lower urinary tract symptoms: Secondary | ICD-10-CM

## 2024-09-22 MED ORDER — FINASTERIDE 5 MG PO TABS: 5.0000 mg | ORAL_TABLET | Freq: Every day | ORAL | 3 refills | Status: AC

## 2024-09-22 NOTE — Telephone Encounter (Signed)
 Pt pharmacy call about pt's refilled of Finasteride . Verbal from Dr. Sherrilee to send in refill.

## 2024-09-25 DIAGNOSIS — R972 Elevated prostate specific antigen [PSA]: Secondary | ICD-10-CM

## 2024-09-27 ENCOUNTER — Telehealth: Payer: Self-pay

## 2024-09-27 NOTE — Telephone Encounter (Signed)
 Opened in error

## 2024-09-27 NOTE — Progress Notes (Signed)
 "  Letter sent.   "

## 2024-09-28 ENCOUNTER — Ambulatory Visit: Admitting: Internal Medicine

## 2024-10-19 ENCOUNTER — Ambulatory Visit: Admitting: Internal Medicine

## 2025-03-29 ENCOUNTER — Other Ambulatory Visit

## 2025-04-04 ENCOUNTER — Ambulatory Visit: Admitting: Urology
